# Patient Record
Sex: Male | Born: 1955 | Race: Black or African American | Hispanic: No | Marital: Married | Smoking: Never smoker
Health system: Southern US, Community
[De-identification: ages and names within clinical notes are randomized; demographics above are authoritative.]

## PROBLEM LIST (undated history)

## (undated) DIAGNOSIS — K219 Gastro-esophageal reflux disease without esophagitis: Secondary | ICD-10-CM

## (undated) DIAGNOSIS — K648 Other hemorrhoids: Secondary | ICD-10-CM

## (undated) DIAGNOSIS — R079 Chest pain, unspecified: Secondary | ICD-10-CM

## (undated) DIAGNOSIS — E119 Type 2 diabetes mellitus without complications: Secondary | ICD-10-CM

## (undated) DIAGNOSIS — S43429A Sprain of unspecified rotator cuff capsule, initial encounter: Secondary | ICD-10-CM

## (undated) DIAGNOSIS — G44309 Post-traumatic headache, unspecified, not intractable: Secondary | ICD-10-CM

## (undated) DIAGNOSIS — K5790 Diverticulosis of intestine, part unspecified, without perforation or abscess without bleeding: Secondary | ICD-10-CM

## (undated) DIAGNOSIS — K625 Hemorrhage of anus and rectum: Secondary | ICD-10-CM

## (undated) DIAGNOSIS — L500 Allergic urticaria: Secondary | ICD-10-CM

## (undated) DIAGNOSIS — T7840XA Allergy, unspecified, initial encounter: Secondary | ICD-10-CM

## (undated) DIAGNOSIS — N281 Cyst of kidney, acquired: Secondary | ICD-10-CM

## (undated) DIAGNOSIS — G4730 Sleep apnea, unspecified: Secondary | ICD-10-CM

## (undated) DIAGNOSIS — S0990XS Unspecified injury of head, sequela: Secondary | ICD-10-CM

## (undated) DIAGNOSIS — E785 Hyperlipidemia, unspecified: Secondary | ICD-10-CM

## (undated) HISTORY — DX: Gastro-esophageal reflux disease without esophagitis: K21.9

## (undated) HISTORY — DX: Type 2 diabetes mellitus without complications: E11.9

## (undated) HISTORY — DX: Post-traumatic headache, unspecified, not intractable: S09.90XS

## (undated) HISTORY — DX: Diverticulosis of intestine, part unspecified, without perforation or abscess without bleeding: K57.90

## (undated) HISTORY — DX: Allergy, unspecified, initial encounter: T78.40XA

## (undated) HISTORY — DX: Cyst of kidney, acquired: N28.1

## (undated) HISTORY — DX: Hyperlipidemia, unspecified: E78.5

## (undated) HISTORY — PX: OTHER SURGICAL HISTORY: SHX169

## (undated) HISTORY — DX: Sleep apnea, unspecified: G47.30

## (undated) HISTORY — DX: Chest pain, unspecified: R07.9

## (undated) HISTORY — DX: Unspecified injury of head, sequela: G44.309

## (undated) HISTORY — DX: Allergic urticaria: L50.0

## (undated) HISTORY — DX: Other hemorrhoids: K64.8

## (undated) HISTORY — PX: KNEE ARTHROSCOPY: SUR90

---
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
## 1999-06-19 ENCOUNTER — Ambulatory Visit (HOSPITAL_COMMUNITY): Admission: RE | Admit: 1999-06-19 | Discharge: 1999-06-19 | Payer: Self-pay | Admitting: Family Medicine

## 1999-06-19 ENCOUNTER — Encounter: Payer: Self-pay | Admitting: Family Medicine

## 1999-07-31 ENCOUNTER — Ambulatory Visit (HOSPITAL_BASED_OUTPATIENT_CLINIC_OR_DEPARTMENT_OTHER): Admission: RE | Admit: 1999-07-31 | Discharge: 1999-07-31 | Payer: Self-pay | Admitting: Orthopedic Surgery

## 1999-08-29 ENCOUNTER — Encounter: Admission: RE | Admit: 1999-08-29 | Discharge: 1999-11-27 | Payer: Self-pay | Admitting: Orthopedic Surgery

## 2001-03-10 ENCOUNTER — Encounter: Payer: Self-pay | Admitting: Internal Medicine

## 2003-12-20 ENCOUNTER — Encounter: Payer: Self-pay | Admitting: Internal Medicine

## 2005-01-05 ENCOUNTER — Ambulatory Visit: Payer: Self-pay | Admitting: Internal Medicine

## 2005-03-06 ENCOUNTER — Ambulatory Visit: Payer: Self-pay | Admitting: Internal Medicine

## 2006-01-09 ENCOUNTER — Ambulatory Visit: Payer: Self-pay | Admitting: Internal Medicine

## 2006-01-11 ENCOUNTER — Ambulatory Visit: Payer: Self-pay | Admitting: Internal Medicine

## 2006-02-26 ENCOUNTER — Ambulatory Visit: Payer: Self-pay | Admitting: Internal Medicine

## 2006-03-08 ENCOUNTER — Encounter: Admission: RE | Admit: 2006-03-08 | Discharge: 2006-03-08 | Payer: Self-pay | Admitting: General Practice

## 2006-03-09 ENCOUNTER — Emergency Department (HOSPITAL_COMMUNITY): Admission: EM | Admit: 2006-03-09 | Discharge: 2006-03-09 | Payer: Self-pay | Admitting: Emergency Medicine

## 2006-06-04 ENCOUNTER — Ambulatory Visit: Payer: Self-pay | Admitting: Internal Medicine

## 2006-06-08 ENCOUNTER — Encounter: Admission: RE | Admit: 2006-06-08 | Discharge: 2006-06-08 | Payer: Self-pay | Admitting: Orthopedic Surgery

## 2006-11-21 ENCOUNTER — Ambulatory Visit: Payer: Self-pay | Admitting: Internal Medicine

## 2006-12-03 ENCOUNTER — Ambulatory Visit: Payer: Self-pay | Admitting: Cardiology

## 2006-12-12 ENCOUNTER — Ambulatory Visit: Payer: Self-pay | Admitting: Cardiology

## 2007-01-06 ENCOUNTER — Ambulatory Visit: Payer: Self-pay | Admitting: Internal Medicine

## 2007-01-06 LAB — CONVERTED CEMR LAB
CO2: 32 meq/L (ref 19–32)
Cholesterol: 174 mg/dL (ref 0–200)
Eosinophils Relative: 6.4 % — ABNORMAL HIGH (ref 0.0–5.0)
Glucose, Bld: 104 mg/dL — ABNORMAL HIGH (ref 70–99)
HCT: 39.3 % (ref 39.0–52.0)
HDL: 41.6 mg/dL (ref >39.0)
Hemoglobin: 13.3 g/dL (ref 13.0–17.0)
Lymphocytes Relative: 32.2 % (ref 12.0–46.0)
MCHC: 33.8 g/dL (ref 30.0–36.0)
Monocytes Absolute: 0.3 10*3/uL (ref 0.2–0.7)
Monocytes Relative: 7.9 % (ref 3.0–11.0)
Neutro Abs: 2.2 10*3/uL (ref 1.4–7.7)
PSA: 0.56 ng/mL (ref 0.10–4.00)
RBC: 4.5 M/uL (ref 4.22–5.81)
RDW: 12.8 % (ref 11.5–14.6)
Triglycerides: 60 mg/dL (ref 0–149)
Uric Acid, Serum: 5.9 mg/dL (ref 2.4–7.0)
VLDL: 12 mg/dL (ref 0–40)
WBC: 4.2 10*3/uL — ABNORMAL LOW (ref 4.5–10.5)

## 2007-01-21 ENCOUNTER — Ambulatory Visit: Payer: Self-pay | Admitting: Internal Medicine

## 2007-04-23 ENCOUNTER — Ambulatory Visit: Payer: Self-pay | Admitting: Internal Medicine

## 2007-04-23 LAB — CONVERTED CEMR LAB
ALT: 24 units/L (ref 0–40)
AST: 22 units/L (ref 0–37)
Cholesterol: 222 mg/dL (ref 0–200)
Direct LDL: 152.6 mg/dL
Glucose, Bld: 88 mg/dL (ref 70–99)
HDL: 40.7 mg/dL (ref >39.0)
Total CHOL/HDL Ratio: 5.5
Triglycerides: 61 mg/dL (ref 0–149)
VLDL: 12 mg/dL (ref 0–40)

## 2007-07-04 ENCOUNTER — Ambulatory Visit: Payer: Self-pay | Admitting: Internal Medicine

## 2007-07-04 LAB — CONVERTED CEMR LAB
ALT: 28 units/L (ref 0–53)
AST: 23 units/L (ref 0–37)
Cholesterol: 204 mg/dL (ref 0–200)
Total CHOL/HDL Ratio: 5.6
Triglycerides: 76 mg/dL (ref 0–149)
VLDL: 15 mg/dL (ref 0–40)

## 2007-07-09 ENCOUNTER — Encounter: Payer: Self-pay | Admitting: Internal Medicine

## 2007-07-10 ENCOUNTER — Ambulatory Visit: Payer: Self-pay | Admitting: Internal Medicine

## 2007-07-10 DIAGNOSIS — M542 Cervicalgia: Secondary | ICD-10-CM | POA: Insufficient documentation

## 2007-07-10 DIAGNOSIS — E785 Hyperlipidemia, unspecified: Secondary | ICD-10-CM

## 2007-09-11 ENCOUNTER — Ambulatory Visit: Payer: Self-pay | Admitting: Internal Medicine

## 2007-09-11 DIAGNOSIS — D696 Thrombocytopenia, unspecified: Secondary | ICD-10-CM

## 2007-09-11 DIAGNOSIS — R413 Other amnesia: Secondary | ICD-10-CM

## 2007-09-11 DIAGNOSIS — R51 Headache: Secondary | ICD-10-CM

## 2007-09-11 DIAGNOSIS — R109 Unspecified abdominal pain: Secondary | ICD-10-CM

## 2007-09-11 DIAGNOSIS — R519 Headache, unspecified: Secondary | ICD-10-CM | POA: Insufficient documentation

## 2007-09-24 ENCOUNTER — Encounter: Admission: RE | Admit: 2007-09-24 | Discharge: 2007-09-24 | Payer: Self-pay | Admitting: Internal Medicine

## 2007-09-29 ENCOUNTER — Encounter: Payer: Self-pay | Admitting: Internal Medicine

## 2007-10-06 ENCOUNTER — Ambulatory Visit: Payer: Self-pay | Admitting: Internal Medicine

## 2007-10-06 DIAGNOSIS — R142 Eructation: Secondary | ICD-10-CM

## 2007-10-06 DIAGNOSIS — R9409 Abnormal results of other function studies of central nervous system: Secondary | ICD-10-CM

## 2007-10-06 DIAGNOSIS — R143 Flatulence: Secondary | ICD-10-CM

## 2007-10-06 DIAGNOSIS — R141 Gas pain: Secondary | ICD-10-CM | POA: Insufficient documentation

## 2007-10-06 DIAGNOSIS — J309 Allergic rhinitis, unspecified: Secondary | ICD-10-CM | POA: Insufficient documentation

## 2007-10-09 ENCOUNTER — Ambulatory Visit: Payer: Self-pay | Admitting: Internal Medicine

## 2007-10-17 LAB — CONVERTED CEMR LAB
AST: 20 units/L (ref 0–37)
Alkaline Phosphatase: 53 units/L (ref 39–117)
Cholesterol: 212 mg/dL (ref 0–200)
Direct LDL: 157.1 mg/dL
Total Bilirubin: 0.9 mg/dL (ref 0.3–1.2)

## 2007-10-28 ENCOUNTER — Encounter: Payer: Self-pay | Admitting: Internal Medicine

## 2007-11-12 ENCOUNTER — Ambulatory Visit: Payer: Self-pay | Admitting: Internal Medicine

## 2007-11-12 DIAGNOSIS — R112 Nausea with vomiting, unspecified: Secondary | ICD-10-CM

## 2007-11-12 DIAGNOSIS — T50995A Adverse effect of other drugs, medicaments and biological substances, initial encounter: Secondary | ICD-10-CM

## 2007-11-20 ENCOUNTER — Telehealth: Payer: Self-pay | Admitting: Internal Medicine

## 2007-11-20 LAB — CONVERTED CEMR LAB
ALT: 24 units/L (ref 0–53)
AST: 20 units/L (ref 0–37)
Alkaline Phosphatase: 48 units/L (ref 39–117)
Basophils Absolute: 0 10*3/uL (ref 0.0–0.1)
Bilirubin, Direct: 0.1 mg/dL (ref 0.0–0.3)
CRP, High Sensitivity: 1 (ref 0.00–5.00)
Eosinophils Relative: 3.8 % (ref 0.0–5.0)
HCT: 38.9 % — ABNORMAL LOW (ref 39.0–52.0)
MCV: 90.3 fL (ref 78.0–100.0)
Monocytes Absolute: 0.3 10*3/uL (ref 0.2–0.7)
Neutro Abs: 2 10*3/uL (ref 1.4–7.7)
Neutrophils Relative %: 52.6 % (ref 43.0–77.0)
Platelets: 72 10*3/uL — ABNORMAL LOW (ref 150–400)
RDW: 12.6 % (ref 11.5–14.6)
Total Bilirubin: 0.8 mg/dL (ref 0.3–1.2)
Total Protein: 6.6 g/dL (ref 6.0–8.3)
WBC: 3.7 10*3/uL — ABNORMAL LOW (ref 4.5–10.5)

## 2007-12-21 IMAGING — CT CT ABDOMEN W/ CM
3 of 5 series · 17 of 46 positions shown, 19 images · IV contrast (omnipaque)
Comparison: none

CLINICAL DATA: Recent history of microscopic hematuria and left flank pain. Small low-density foci in the liver and in the right kidney noted on noncontrast CT scan on 12/03/06.
ABDOMEN CT WITH CONTRAST:
TECHNIQUE: Multidetector CT imaging of the abdomen was performed following the standard protocol during bolus administration of intravenous contrast.
Contrast:  014cc Omnipaque 300 and oral Gastrografin.

[Series 2: renal_70s 5.0 b30f · axial · 0.75mm/px · z∈[-253,-28]mm · 12 of 55 slices shown, 14 images]
[im 5/55  soft-tissue]
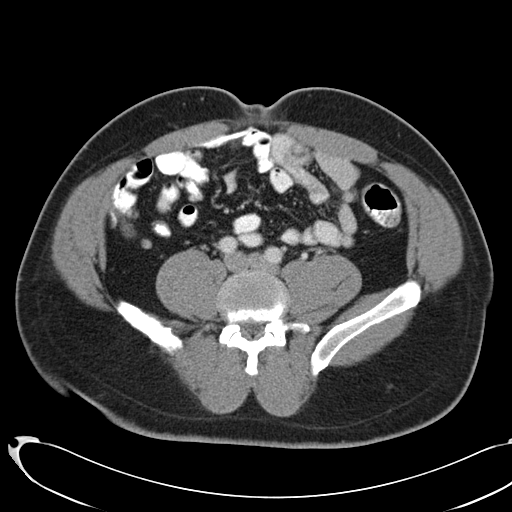
[im 5/55  bone]
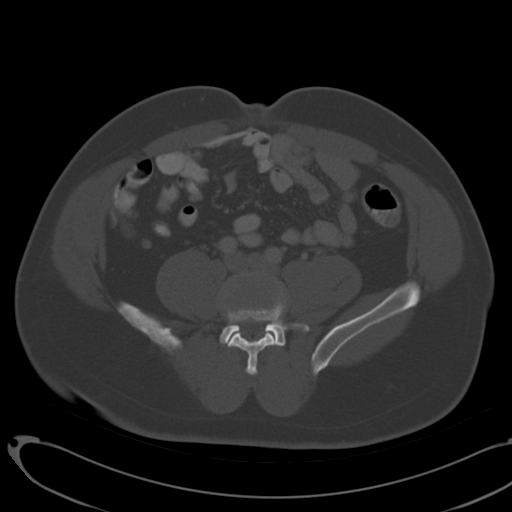
[im 9/55  soft-tissue]
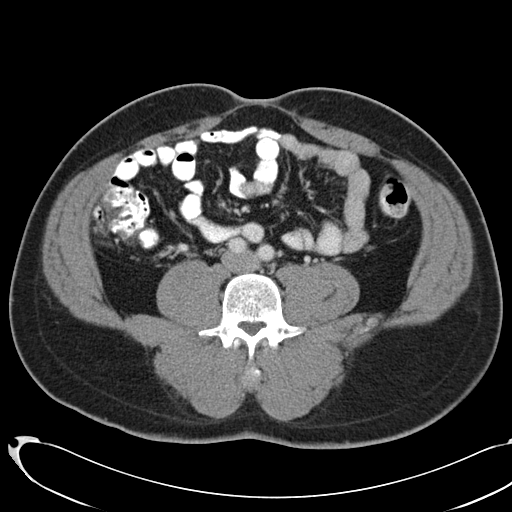
[im 13/55  soft-tissue]
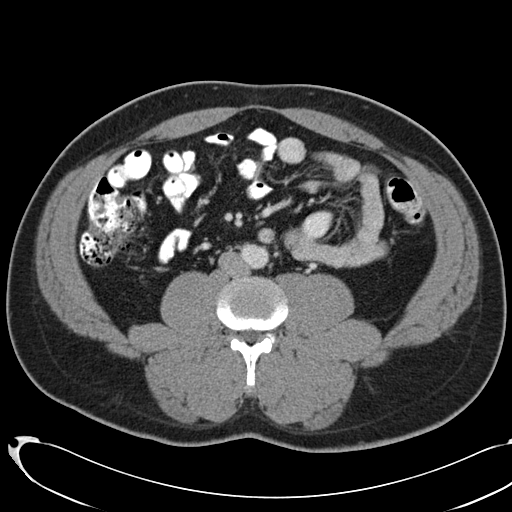
[im 17/55  soft-tissue]
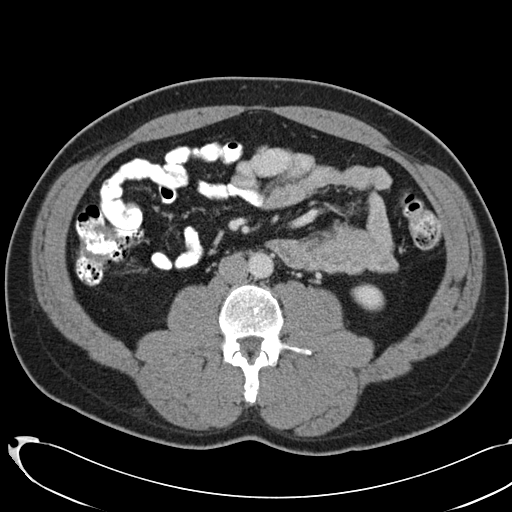
[im 21/55  soft-tissue]
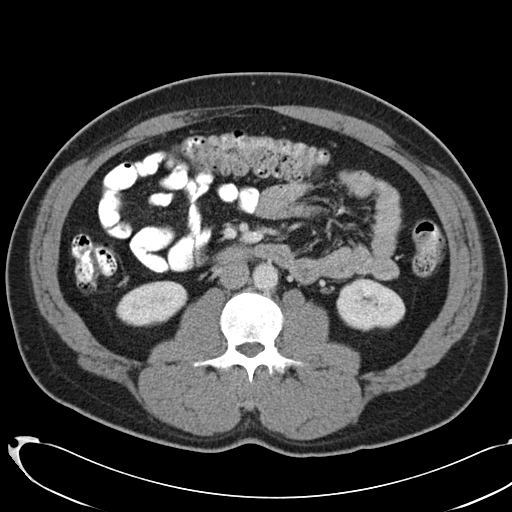
[im 25/55  soft-tissue]
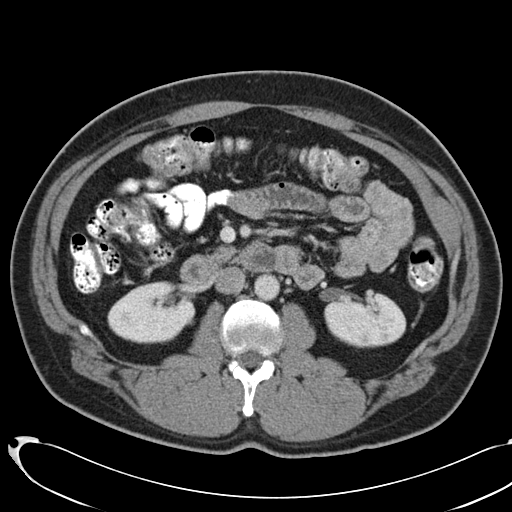
[im 30/55  soft-tissue]
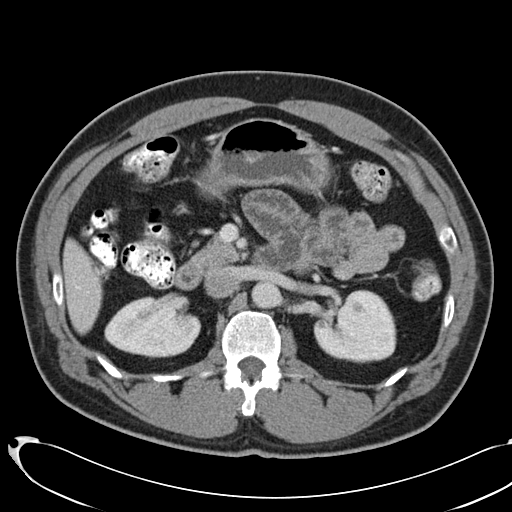
[im 34/55  soft-tissue]
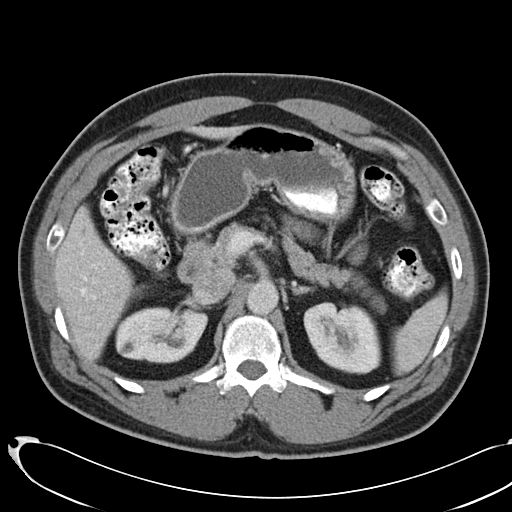
[im 38/55  soft-tissue]
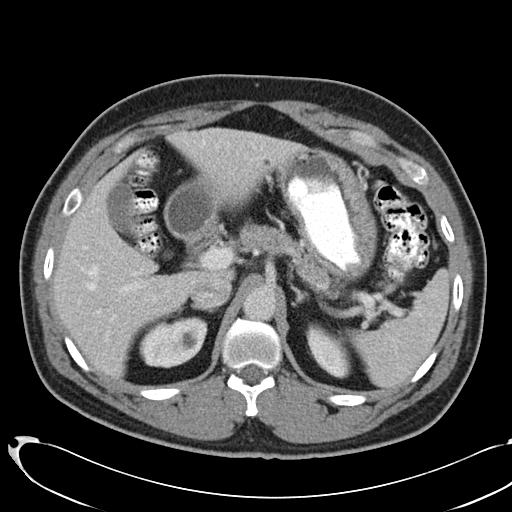
[im 38/55  bone]
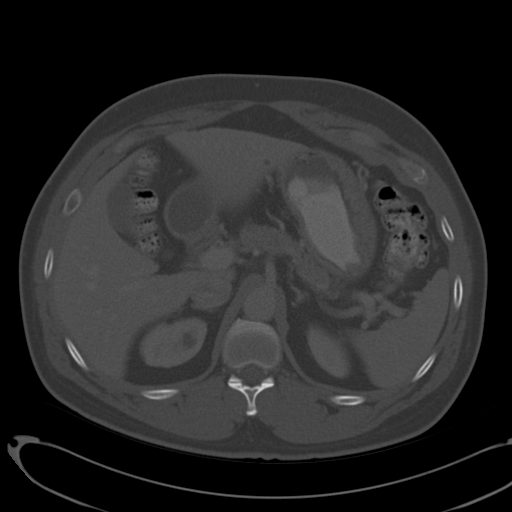
[im 42/55  soft-tissue]
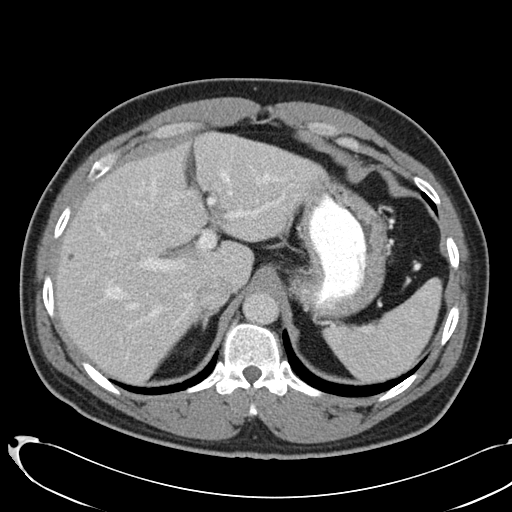
[im 46/55  soft-tissue]
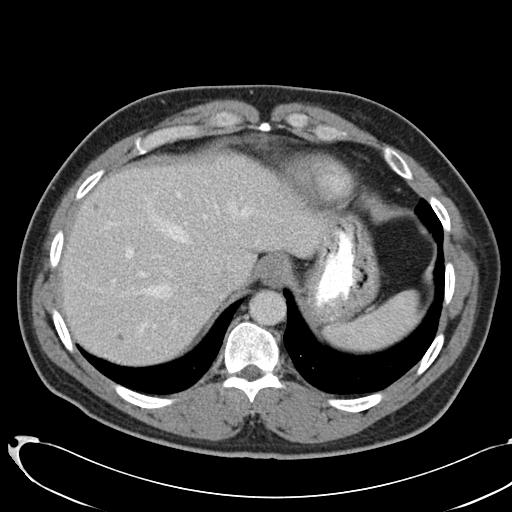
[im 50/55  soft-tissue]
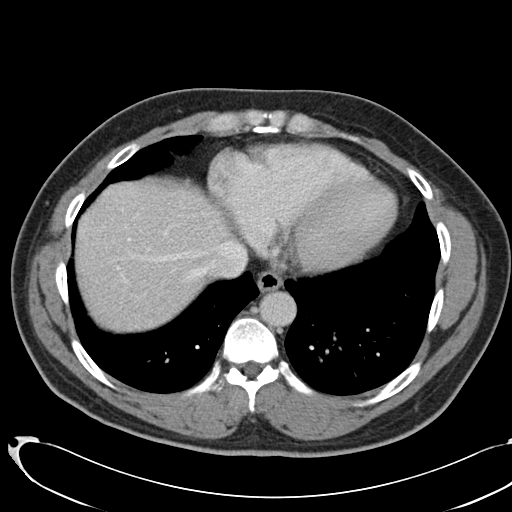

[Series 3: renal_70s 5.0 b70f lung · axial · 0.75mm/px · z∈[-88,-68]mm · 2 of 22 slices shown]
[im 5/22  bone]
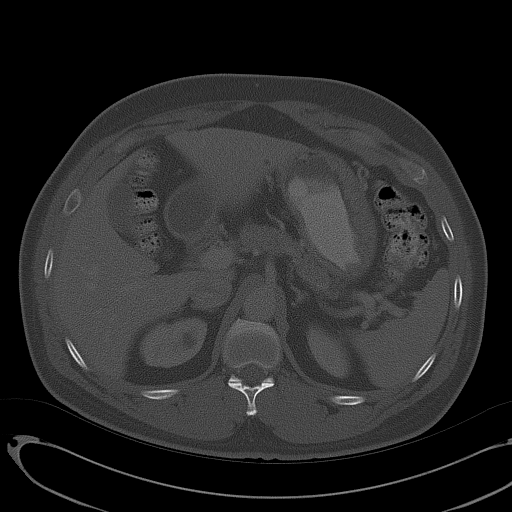
[im 9/22  bone]
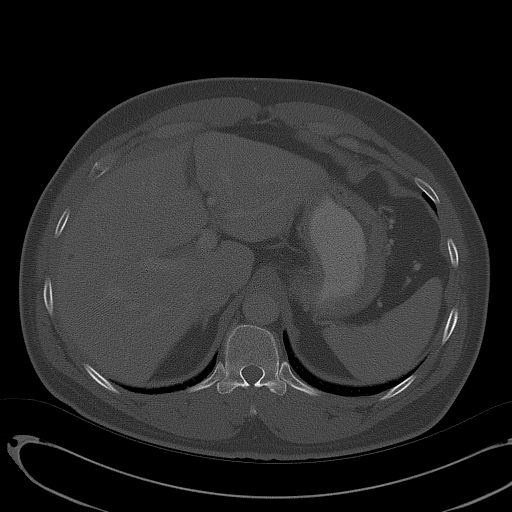

[Series 602: <mpr thick range> · coronal · 0.75mm/px · 3 of 89 slices shown]
[im 30/89  soft-tissue]
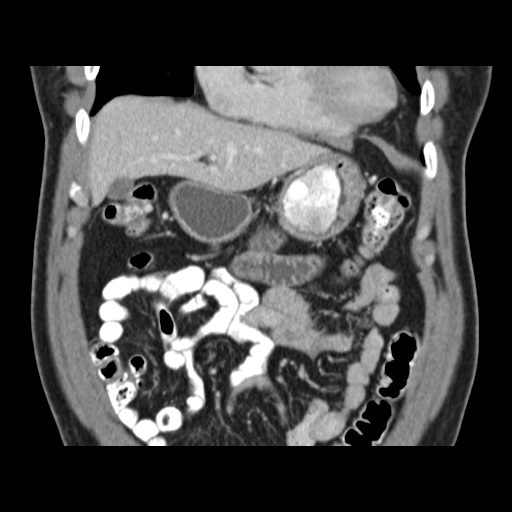
[im 40/89  soft-tissue]
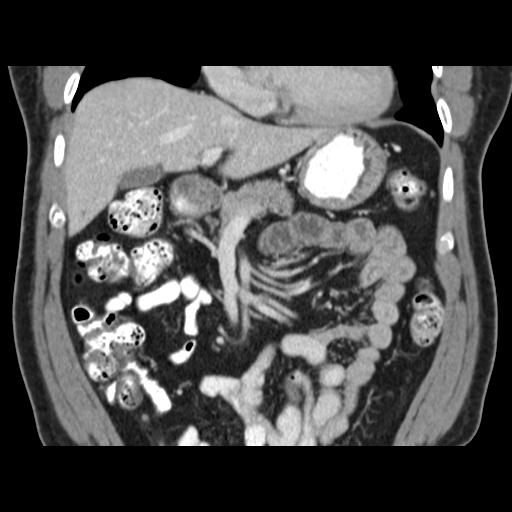
[im 49/89  soft-tissue]
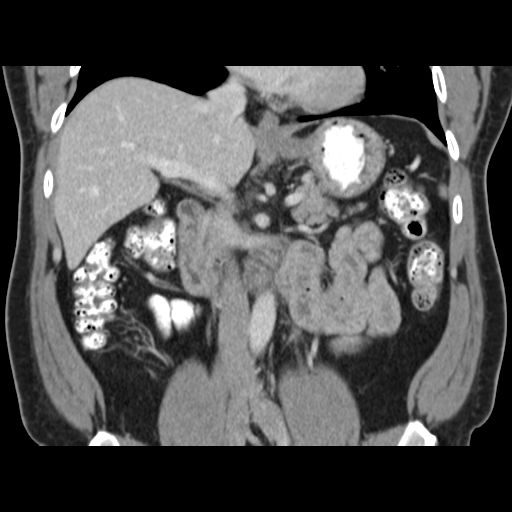

[17 of 46 positions shown; findings below may reference images not displayed]

FINDINGS: Three minute low density foci in the right hepatic lobe, one located anteriorly, one laterally, and one posteriorly measuring approximately 5 x 6 mm. These represent small hepatic cysts.  Small bilateral renal cysts, the largest in the upper pole of the right kidney measuring 1.2 cm and another slightly more inferiorly in the upper pole measuring 9 mm. There is also a cyst in the medial interpolar region and measuring 0.8 x 1.4 cm.  There is a cyst in the mid to inferior aspect of the left kidney measuring 6 mm.
IMPRESSION: Contrast enhanced study reveals bilateral (right greater than left) renal cysts and small hepatic cysts. No solid mass lesions.

## 2007-12-30 ENCOUNTER — Ambulatory Visit: Payer: Self-pay | Admitting: Family Medicine

## 2007-12-30 DIAGNOSIS — L500 Allergic urticaria: Secondary | ICD-10-CM

## 2007-12-30 HISTORY — DX: Allergic urticaria: L50.0

## 2008-01-06 ENCOUNTER — Encounter: Payer: Self-pay | Admitting: Internal Medicine

## 2008-01-22 ENCOUNTER — Telehealth: Payer: Self-pay | Admitting: Internal Medicine

## 2008-01-26 ENCOUNTER — Ambulatory Visit: Payer: Self-pay | Admitting: Internal Medicine

## 2008-01-26 DIAGNOSIS — D649 Anemia, unspecified: Secondary | ICD-10-CM

## 2008-02-16 LAB — CONVERTED CEMR LAB
ALT: 20 units/L (ref 0–53)
Basophils Absolute: 0 10*3/uL (ref 0.0–0.1)
Cholesterol: 186 mg/dL (ref 0–200)
Eosinophils Absolute: 0.5 10*3/uL (ref 0.0–0.6)
Ferritin: 300 ng/mL (ref 22.0–322.0)
Folate: 8.2 ng/mL
HCT: 38.7 % — ABNORMAL LOW (ref 39.0–52.0)
Hemoglobin: 12.9 g/dL — ABNORMAL LOW (ref 13.0–17.0)
MCV: 88.9 fL (ref 78.0–100.0)
Monocytes Absolute: 0.3 10*3/uL (ref 0.2–0.7)
Monocytes Relative: 7.8 % (ref 3.0–11.0)
Total CHOL/HDL Ratio: 5.5
Triglycerides: 46 mg/dL (ref 0–149)
WBC: 3.8 10*3/uL — ABNORMAL LOW (ref 4.5–10.5)

## 2008-06-03 ENCOUNTER — Telehealth: Payer: Self-pay | Admitting: Internal Medicine

## 2009-07-19 ENCOUNTER — Ambulatory Visit: Payer: Self-pay | Admitting: Internal Medicine

## 2009-07-19 DIAGNOSIS — K6289 Other specified diseases of anus and rectum: Secondary | ICD-10-CM

## 2009-07-19 DIAGNOSIS — M25559 Pain in unspecified hip: Secondary | ICD-10-CM

## 2009-07-19 DIAGNOSIS — M25569 Pain in unspecified knee: Secondary | ICD-10-CM | POA: Insufficient documentation

## 2009-07-27 ENCOUNTER — Encounter: Payer: Self-pay | Admitting: Internal Medicine

## 2009-08-29 ENCOUNTER — Ambulatory Visit: Payer: Self-pay | Admitting: Internal Medicine

## 2009-08-29 LAB — CONVERTED CEMR LAB
ALT: 28 units/L (ref 0–53)
Albumin: 4.1 g/dL (ref 3.5–5.2)
BUN: 13 mg/dL (ref 6–23)
Basophils Absolute: 0 10*3/uL (ref 0.0–0.1)
Basophils Relative: 0.3 % (ref 0.0–3.0)
Bilirubin, Direct: 0 mg/dL (ref 0.0–0.3)
Calcium: 8.9 mg/dL (ref 8.4–10.5)
Creatinine, Ser: 1 mg/dL (ref 0.4–1.5)
Direct LDL: 145.9 mg/dL
GFR calc non Af Amer: 100.28 mL/min (ref >60)
Glucose, Bld: 91 mg/dL (ref 70–99)
HCT: 39.4 % (ref 39.0–52.0)
HDL: 42.2 mg/dL (ref >39.00)
Lymphs Abs: 1.3 10*3/uL (ref 0.7–4.0)
MCHC: 33.9 g/dL (ref 30.0–36.0)
MCV: 91.9 fL (ref 78.0–100.0)
Monocytes Absolute: 0.3 10*3/uL (ref 0.1–1.0)
Monocytes Relative: 8.2 % (ref 3.0–12.0)
Nitrite: NEGATIVE
Potassium: 4.2 meq/L (ref 3.5–5.1)
Protein, U semiquant: NEGATIVE
Sodium: 144 meq/L (ref 135–145)
Specific Gravity, Urine: 1.02
TSH: 2.81 microintl units/mL (ref 0.35–5.50)
Total Bilirubin: 1.1 mg/dL (ref 0.3–1.2)
Total CHOL/HDL Ratio: 5
Triglycerides: 64 mg/dL (ref 0.0–149.0)
Urobilinogen, UA: 0.2
pH: 6

## 2009-09-13 ENCOUNTER — Ambulatory Visit: Payer: Self-pay | Admitting: Internal Medicine

## 2009-09-13 DIAGNOSIS — Z87448 Personal history of other diseases of urinary system: Secondary | ICD-10-CM

## 2009-09-13 DIAGNOSIS — R9431 Abnormal electrocardiogram [ECG] [EKG]: Secondary | ICD-10-CM | POA: Insufficient documentation

## 2009-09-21 ENCOUNTER — Encounter (INDEPENDENT_AMBULATORY_CARE_PROVIDER_SITE_OTHER): Payer: Self-pay | Admitting: *Deleted

## 2009-11-17 ENCOUNTER — Encounter (INDEPENDENT_AMBULATORY_CARE_PROVIDER_SITE_OTHER): Payer: Self-pay | Admitting: *Deleted

## 2009-12-12 ENCOUNTER — Encounter (INDEPENDENT_AMBULATORY_CARE_PROVIDER_SITE_OTHER): Payer: Self-pay | Admitting: *Deleted

## 2009-12-13 ENCOUNTER — Ambulatory Visit: Payer: Self-pay | Admitting: Internal Medicine

## 2009-12-14 ENCOUNTER — Encounter: Payer: Self-pay | Admitting: Internal Medicine

## 2009-12-22 ENCOUNTER — Telehealth: Payer: Self-pay | Admitting: Internal Medicine

## 2009-12-27 ENCOUNTER — Ambulatory Visit: Payer: Self-pay | Admitting: Internal Medicine

## 2009-12-27 HISTORY — PX: COLONOSCOPY: SHX174

## 2009-12-28 ENCOUNTER — Encounter: Payer: Self-pay | Admitting: Internal Medicine

## 2009-12-30 ENCOUNTER — Encounter: Admission: RE | Admit: 2009-12-30 | Discharge: 2009-12-30 | Payer: Self-pay | Admitting: Specialist

## 2010-01-04 ENCOUNTER — Ambulatory Visit: Payer: Self-pay | Admitting: Cardiology

## 2010-01-04 ENCOUNTER — Encounter: Payer: Self-pay | Admitting: Cardiology

## 2010-01-04 DIAGNOSIS — R079 Chest pain, unspecified: Secondary | ICD-10-CM | POA: Insufficient documentation

## 2010-01-04 HISTORY — DX: Chest pain, unspecified: R07.9

## 2010-01-24 ENCOUNTER — Encounter: Payer: Self-pay | Admitting: Cardiology

## 2010-03-07 ENCOUNTER — Telehealth: Payer: Self-pay | Admitting: *Deleted

## 2010-03-14 DIAGNOSIS — Z860100 Personal history of colon polyps, unspecified: Secondary | ICD-10-CM | POA: Insufficient documentation

## 2010-03-14 DIAGNOSIS — K5730 Diverticulosis of large intestine without perforation or abscess without bleeding: Secondary | ICD-10-CM | POA: Insufficient documentation

## 2010-03-14 DIAGNOSIS — K649 Unspecified hemorrhoids: Secondary | ICD-10-CM | POA: Insufficient documentation

## 2010-03-14 DIAGNOSIS — D72819 Decreased white blood cell count, unspecified: Secondary | ICD-10-CM | POA: Insufficient documentation

## 2010-03-22 ENCOUNTER — Encounter (INDEPENDENT_AMBULATORY_CARE_PROVIDER_SITE_OTHER): Payer: Self-pay | Admitting: *Deleted

## 2010-05-22 ENCOUNTER — Ambulatory Visit: Payer: Self-pay | Admitting: Internal Medicine

## 2010-05-22 ENCOUNTER — Encounter: Payer: Self-pay | Admitting: Internal Medicine

## 2010-05-23 ENCOUNTER — Ambulatory Visit: Payer: Self-pay | Admitting: Internal Medicine

## 2010-05-27 ENCOUNTER — Encounter: Payer: Self-pay | Admitting: Internal Medicine

## 2010-09-14 ENCOUNTER — Ambulatory Visit: Payer: Self-pay | Admitting: Internal Medicine

## 2010-09-14 DIAGNOSIS — K625 Hemorrhage of anus and rectum: Secondary | ICD-10-CM

## 2010-09-14 HISTORY — DX: Hemorrhage of anus and rectum: K62.5

## 2010-09-14 LAB — CONVERTED CEMR LAB
Bilirubin Urine: NEGATIVE
Glucose, Urine, Semiquant: NEGATIVE
Ketones, urine, test strip: NEGATIVE
Nitrite: NEGATIVE
Protein, U semiquant: NEGATIVE
Urobilinogen, UA: 0.2
WBC Urine, dipstick: NEGATIVE
pH: 6

## 2010-09-20 ENCOUNTER — Telehealth: Payer: Self-pay | Admitting: *Deleted

## 2010-09-20 LAB — CONVERTED CEMR LAB
Alkaline Phosphatase: 50 units/L (ref 39–117)
Basophils Absolute: 0 10*3/uL (ref 0.0–0.1)
Bilirubin, Direct: 0.1 mg/dL (ref 0.0–0.3)
Calcium: 9.4 mg/dL (ref 8.4–10.5)
Creatinine, Ser: 0.9 mg/dL (ref 0.4–1.5)
Folate: 8.2 ng/mL
Glucose, Bld: 83 mg/dL (ref 70–99)
Hemoglobin: 12.9 g/dL — ABNORMAL LOW (ref 13.0–17.0)
Iron: 68 ug/dL (ref 42–165)
Lymphocytes Relative: 32.9 % (ref 12.0–46.0)
Monocytes Absolute: 0.3 10*3/uL (ref 0.1–1.0)
Monocytes Relative: 7.9 % (ref 3.0–12.0)
Neutro Abs: 2 10*3/uL (ref 1.4–7.7)
Neutrophils Relative %: 54.9 % (ref 43.0–77.0)
Platelets: 102 10*3/uL — ABNORMAL LOW (ref 150.0–400.0)
Potassium: 3.9 meq/L (ref 3.5–5.1)
RBC: 4.27 M/uL (ref 4.22–5.81)
RDW: 13.7 % (ref 11.5–14.6)
Sodium: 141 meq/L (ref 135–145)

## 2010-09-21 ENCOUNTER — Encounter: Admission: RE | Admit: 2010-09-21 | Discharge: 2010-09-21 | Payer: Self-pay | Admitting: Internal Medicine

## 2010-09-21 ENCOUNTER — Telehealth: Payer: Self-pay | Admitting: *Deleted

## 2010-09-25 ENCOUNTER — Ambulatory Visit: Payer: Self-pay | Admitting: Internal Medicine

## 2010-09-26 ENCOUNTER — Telehealth: Payer: Self-pay | Admitting: *Deleted

## 2010-09-27 ENCOUNTER — Ambulatory Visit: Payer: Self-pay | Admitting: Internal Medicine

## 2010-10-02 ENCOUNTER — Encounter: Payer: Self-pay | Admitting: Internal Medicine

## 2010-10-02 LAB — CBC WITH DIFFERENTIAL/PLATELET
Basophils Absolute: 0 10*3/uL (ref 0.0–0.1)
EOS%: 3.7 % (ref 0.0–7.0)
Eosinophils Absolute: 0.2 10*3/uL (ref 0.0–0.5)
HCT: 36.9 % — ABNORMAL LOW (ref 38.4–49.9)
HGB: 12.7 g/dL — ABNORMAL LOW (ref 13.0–17.1)
MCH: 28.8 pg (ref 27.2–33.4)
MONO#: 0.3 10*3/uL (ref 0.1–0.9)
NEUT#: 2.1 10*3/uL (ref 1.5–6.5)
NEUT%: 51.2 % (ref 39.0–75.0)
lymph#: 1.6 10*3/uL (ref 0.9–3.3)

## 2010-10-02 LAB — COMPREHENSIVE METABOLIC PANEL
ALT: 11 U/L (ref 0–53)
Albumin: 4.3 g/dL (ref 3.5–5.2)
CO2: 28 mEq/L (ref 19–32)
Chloride: 106 mEq/L (ref 96–112)
Glucose, Bld: 80 mg/dL (ref 70–99)
Potassium: 3.9 mEq/L (ref 3.5–5.3)
Sodium: 142 mEq/L (ref 135–145)
Total Protein: 6.6 g/dL (ref 6.0–8.3)

## 2010-10-02 LAB — TECHNOLOGIST REVIEW

## 2010-10-02 LAB — LACTATE DEHYDROGENASE: LDH: 130 U/L (ref 94–250)

## 2010-11-01 ENCOUNTER — Telehealth: Payer: Self-pay | Admitting: Internal Medicine

## 2010-11-02 ENCOUNTER — Ambulatory Visit: Admit: 2010-11-02 | Payer: Self-pay | Admitting: Internal Medicine

## 2010-11-02 ENCOUNTER — Telehealth: Payer: Self-pay | Admitting: Internal Medicine

## 2010-11-13 ENCOUNTER — Encounter: Payer: Self-pay | Admitting: Internal Medicine

## 2010-11-15 ENCOUNTER — Telehealth (INDEPENDENT_AMBULATORY_CARE_PROVIDER_SITE_OTHER): Payer: Self-pay | Admitting: *Deleted

## 2010-11-15 ENCOUNTER — Ambulatory Visit
Admission: RE | Admit: 2010-11-15 | Discharge: 2010-11-15 | Payer: Self-pay | Source: Home / Self Care | Attending: Internal Medicine | Admitting: Internal Medicine

## 2010-11-27 ENCOUNTER — Ambulatory Visit: Payer: Self-pay | Admitting: Internal Medicine

## 2010-11-29 LAB — LACTATE DEHYDROGENASE: LDH: 147 U/L (ref 94–250)

## 2010-11-29 LAB — CBC & DIFF AND RETIC
HCT: 39.2 % (ref 38.4–49.9)
HGB: 13.2 g/dL (ref 13.0–17.1)
MCH: 28.7 pg (ref 27.2–33.4)
MCHC: 33.7 g/dL (ref 32.0–36.0)
NEUT#: 2.4 10*3/uL (ref 1.5–6.5)
RBC: 4.6 10*6/uL (ref 4.20–5.82)
RDW: 13.4 % (ref 11.0–14.6)
Retic %: 1.4 % (ref 0.50–1.60)
WBC: 4.3 10*3/uL (ref 4.0–10.3)

## 2010-11-29 LAB — COMPREHENSIVE METABOLIC PANEL
AST: 26 U/L (ref 0–37)
Albumin: 4.2 g/dL (ref 3.5–5.2)
CO2: 26 mEq/L (ref 19–32)
Calcium: 9.8 mg/dL (ref 8.4–10.5)
Chloride: 106 mEq/L (ref 96–112)
Glucose, Bld: 116 mg/dL — ABNORMAL HIGH (ref 70–99)
Potassium: 4.2 mEq/L (ref 3.5–5.3)
Sodium: 143 mEq/L (ref 135–145)
Total Bilirubin: 1.1 mg/dL (ref 0.3–1.2)

## 2010-12-01 LAB — HEMOGLOBINOPATHY EVALUATION
Hemoglobin Other: 0 % (ref 0.0–0.0)
Hgb S Quant: 0 % (ref 0.0–0.0)

## 2010-12-01 LAB — PROTEIN ELECTROPHORESIS, SERUM
Albumin ELP: 63.2 % (ref 55.8–66.1)
Alpha-1-Globulin: 3.8 % (ref 2.9–4.9)
Alpha-2-Globulin: 8.6 % (ref 7.1–11.8)
Beta 2: 5.8 % (ref 3.2–6.5)
Beta Globulin: 5.7 % (ref 4.7–7.2)

## 2010-12-01 LAB — IRON AND TIBC: UIBC: 271 ug/dL

## 2010-12-01 LAB — FOLATE: Folate: 12.2 ng/mL

## 2010-12-01 LAB — ERYTHROPOIETIN: Erythropoietin: 14.7 m[IU]/mL (ref 2.6–34.0)

## 2010-12-01 LAB — FERRITIN: Ferritin: 451 ng/mL — ABNORMAL HIGH (ref 22–322)
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
## 2010-12-05 NOTE — Miscellaneous (Signed)
  Clinical Lists Changes  Observations: Added new observation of CT OF HEAD:   CT HEAD WITHOUT CONTRAST   IMPRESSION:   Normal noncontrast CT appearance of the brain for age.    Read By:  Augusto Gamble,  M.D.   Released By:  Augusto Gamble,  M.D. (12/30/2009 12:23) Added new observation of CXR RESULTS:  CHEST - 2 VIEW:   Comparison: 03/08/06.   Findings: Cardiac silhouette, mediastinal and hilar contours are   within normal limits and relatively stable. The heart is borderline   in size but unchanged.  No acute pulmonary findings. No definite rib   fractures.  No sternal fracture is seen on the lateral film.   IMPRESSION:   Borderline cardiac enlargement, stable.   No acute pulmonary findings and intact bony thorax.    Read By:  Cyndie Chime,  M.D.   Released By:  Cyndie Chime,  M.D. (03/09/2006 12:24)      CT Brain  Procedure date:  12/30/2009  Findings:        CT HEAD WITHOUT CONTRAST   IMPRESSION:   Normal noncontrast CT appearance of the brain for age.    Read By:  Augusto Gamble,  M.D.   Released By:  Augusto Gamble,  M.D.  CXR  Procedure date:  03/09/2006  Findings:       CHEST - 2 VIEW:   Comparison: 03/08/06.   Findings: Cardiac silhouette, mediastinal and hilar contours are   within normal limits and relatively stable. The heart is borderline   in size but unchanged.  No acute pulmonary findings. No definite rib   fractures.  No sternal fracture is seen on the lateral film.   IMPRESSION:   Borderline cardiac enlargement, stable.   No acute pulmonary findings and intact bony thorax.    Read By:  Cyndie Chime,  M.D.   Released By:  Cyndie Chime,  M.D.

## 2010-12-05 NOTE — Procedures (Signed)
Summary: Upper Endoscopy  Patient: [name] Note: All result statuses are Final unless otherwise noted.  Tests: (1) Upper Endoscopy (EGD)   EGD Upper Endoscopy       DONE     [facility]     [address]     [place], [place]  [address]           ENDOSCOPY PROCEDURE REPORT           PATIENT:  Brad Terry, Brad Terry  MR#:  [id number]     BIRTHDATE:  05-19-1956, 54 yrs. old  GENDER:  male           ENDOSCOPIST:  Hedwig Morton. Juanda Chance, MD     Referred by:  Neta Mends. Panosh, M.D.           PROCEDURE DATE:  05/23/2010     PROCEDURE:  EGD with biopsy     ASA CLASS:  Class II     INDICATIONS:  chest pain right sided chest pain,, headache, worse     with eating,     EGD 1989 was normal ( hx of IBS/dyspepsia)           MEDICATIONS:   Versed 6 mg, Fentanyl 75 mcg     TOPICAL ANESTHETIC:  Exactacain Spray           DESCRIPTION OF PROCEDURE:   After the risks benefits and     alternatives of the procedure were thoroughly explained, informed     consent was obtained.  The LB [id number] endoscope was     introduced through the mouth and advanced to the second portion of     the duodenum, without limitations.  The instrument was slowly     withdrawn as the mucosa was fully examined.     <<PROCEDUREIMAGES>>           irregular Z-line. With standard forceps, a biopsy was obtained and     sent to pathology (see image1, image6, and image5).  Otherwise the     examination was normal. With standard forceps, a biopsy was     obtained and sent to pathology (see image4, image3, and image2).     Retroflexed views revealed no abnormalities.    The scope was then     withdrawn from the patient and the procedure completed.           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           ______________________________     Hedwig Morton. Juanda Chance, MD           CC:           n.     eSIGNED:   Hedwig Morton. Brodie at 05/23/2010 04:01 PM           Brad Terry, Brad Terry, [id number]  Note: An exclamation mark (!) indicates a result that was not dispersed into the flowsheet. Document Creation Date: 05/23/2010 4:03 PM _______________________________________________________________________  (1) Order result status: Final Collection or observation date-time: 05/23/2010 15:51 Requested date-time:  Receipt date-time:  Reported date-time:  Referring Physician:   Ordering Physician: Lina Sar [id number]) Specimen Source:  Source: Launa Grill Order Number: [id number] Lab site:

## 2010-12-05 NOTE — Letter (Signed)
Summary: EGD Instructions  [facility]  [address] [place], [place] [address]   Phone: [phone]  Fax: [phone]       Brad Terry    09/18/1956    MRN: [id number]       Procedure Day /Date: 05/23/10 Tuesday     Arrival Time:  2:00 pm     Procedure Time: 3:00 pm     Location of Procedure:                    _x  _ [facility] (4th Floor)  PREPARATION FOR ENDOSCOPY   On 05/13/10 THE DAY OF THE PROCEDURE:  1.   No solid foods, milk or milk products are allowed after midnight the night before your procedure.  2.   Do not drink anything colored red or purple.  Avoid juices with pulp.  No orange juice.  3.  You may drink clear liquids until 1:00 pm, which is 2 hours before your procedure.                                                                                                CLEAR LIQUIDS INCLUDE: Water Jello Ice Popsicles Tea (sugar ok, no milk/cream) Powdered fruit flavored drinks Coffee (sugar ok, no milk/cream) Gatorade Juice: apple, white grape, white cranberry  Lemonade Clear bullion, consomm, broth Carbonated beverages (any kind) Strained chicken noodle soup Hard Candy   MEDICATION INSTRUCTIONS  Unless otherwise instructed, you should take regular prescription medications with a small sip of water as early as possible the morning of your procedure.                   OTHER INSTRUCTIONS  You will need a responsible adult at least 55 years of age to accompany you and drive you home.   This person must remain in the waiting room during your procedure.  Wear loose fitting clothing that is easily removed.  Leave jewelry and other valuables at home.  However, you may wish to bring a book to read or an iPod/MP3 player to listen to music as you wait for your procedure to start.  Remove all body piercing jewelry and leave at home.  Total time from sign-in until discharge is approximately 2-3 hours.  You  should go home directly after your procedure and rest.  You can resume normal activities the day after your procedure.  The day of your procedure you should not:   Drive   Make legal decisions   Operate machinery   Drink alcohol   Return to work  You will receive specific instructions about eating, activities and medications before you leave.    The above instructions have been reviewed and explained to me by   Lamona Curl CMA Duncan Dull)  May 22, 2010 4:03 PM     I fully understand and can verbalize these instructions _____________________________ Date 05/22/10

## 2010-12-05 NOTE — Assessment & Plan Note (Signed)
Summary: Gastroenterology     [facility]   OFFICE NOTE  NAME:  Brad Terry, Brad Terry        OFFICE NO:  [id number]    DATE:  03-10-01     The patient is a very nice 55 year old gentleman from [place] who has a change in bowel habits, decreased caliber of the stools, no blood, and some mild constipation.  He also had one episode of nausea last week.  He tried to vomit, but was unable.  He developed some dizziness after trying to gag and retch.  He has no family history of colon cancer.    Approximately 2 months ago, the patient developed rectal pain and was evaluated by a physician.  Urinalysis was negative and apparently KUB was also negative.  The pain has subsided.    PHYSICAL EXAMINATION:  Blood pressure 110/80.  Pulse 72.  Weight 215 pounds.  Abdomen was soft with tenderness along the left lower quadrant, but no palpable mass. The liver edge was at the costal margin.  Chest was normal.  Cor with normal S1 and normal S2.  Rectal and anoscopic exam showed first grade hemorrhoids with somewhat tight rectal sphincter tone with some guarding.  Stool was soft and Hemoccult negative.  There was no perianal disease.  IMPRESSION:   1.   Change in bowel habits most likely on the basis of irritable bowel syndrome.  No risk factors for colon cancer. 2.   Hemorrhoids by anoscopy. 3.   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  This could be causing exacerbation of the irritable bowel syndrome.   4.   History of decreased platelet count and wbc count needs to be followed.    PLAN:   1.   C-MET, CBC and PSA today. 2.   Anusol-HC suppositories for hemorrhoids. 3.   Diazepam 5 mg on a p.r.n. basis. 4.   The patient needs a general internist to take care of his preventative care. 5.   Consider colonoscopy.  Since patient has no insurance, we will hold off on colonoscopy and see how his symptoms improve.   Hart Carwin, M.D.  [id number]  D: 03/10/01  T: 03/10/01  Job # [id number]

## 2010-12-05 NOTE — Miscellaneous (Signed)
Summary: SCREENING COLON--CH.  Clinical Lists Changes  Medications: Added new medication of MIRALAX   POWD (POLYETHYLENE GLYCOL 3350) As directed - Signed Added new medication of REGLAN 10 MG  TABS (METOCLOPRAMIDE HCL) As directed - Signed Added new medication of DULCOLAX 5 MG  TBEC (BISACODYL) As directed - Signed Rx of MIRALAX   POWD (POLYETHYLENE GLYCOL 3350) As directed;  #255 x 1;  Signed;  Entered by: Clide Cliff RN;  Authorized by: Hart Carwin MD;  Method used: [facility] [address] [phone]*, [address], [place], [place], [place]  [address], Ph: [phone] or [phone], Fax: [phone] Rx of REGLAN 10 MG  TABS (METOCLOPRAMIDE HCL) As directed;  #2 x 0;  Signed;  Entered by: Clide Cliff RN;  Authorized by: Hart Carwin MD;  Method used: Electronically to [facility] [address] [phone]*, [address] [place], [place], [place]  [address], Ph: [phone] or [phone], Fax: [phone] Rx of DULCOLAX 5 MG  TBEC (BISACODYL) As directed;  #4 x 0;  Signed;  Entered by: Clide Cliff RN;  Authorized by: Hart Carwin MD;  Method used: Electronically to [facility] [address] [phone]*, [address] [place], [place], [place]  [address], Ph: [phone] or [phone], Fax: [phone] Observations: Added new observation of ALLERGY REV: Done (12/13/2009 13:34)    Prescriptions: DULCOLAX 5 MG  TBEC (BISACODYL) As directed  #4 x 0   Entered by:   Clide Cliff RN   Authorized by:   Hart Carwin MD   Signed by:   Clide Cliff RN on 12/13/2009   Method used:   Electronically to        [facility] [phone]* (retail)       [address]       [place], [place]  [address]       Ph: [phone] or [phone]       Fax: [phone]   RxID:   [id number] REGLAN 10 MG  TABS (METOCLOPRAMIDE HCL) As directed  #2 x 0   Entered by:   Clide Cliff RN   Authorized by:   Hart Carwin  MD   Signed by:   Clide Cliff RN on 12/13/2009   Method used:   Electronically to        [facility] [phone]* (retail)       [address]       [place], [place]  [address]       Ph: [phone] or [phone]       Fax: [phone]   RxID:   [id number] MIRALAX   POWD (POLYETHYLENE GLYCOL 3350) As directed  #255 x 1   Entered by:   Clide Cliff RN   Authorized by:   Hart Carwin MD   Signed by:   Clide Cliff RN on 12/13/2009   Method used:   Electronically to        [facility] [phone]* (retail)       [address]       [place], [place]  [address]       Ph: [phone] or [phone]       Fax: [phone]   RxID:   [id number]

## 2010-12-05 NOTE — Letter (Signed)
Summary: New Patient letter  [facility]  [address] [place], [place] [address]   Phone: [phone]  Fax: [phone]       03/22/2010 MRN: [id number]  Brad Terry [address] [place], [place]  [address]  Dear Brad Terry,  Welcome to the Gastroenterology Division at [facility].    You are scheduled to see Dr.  Juanda Chance on 05-22-10 at 2:45pm on the 3rd floor at [facility], [address].  We ask that you try to arrive at our office 15 minutes prior to your appointment time to allow for check-in.  We would like you to complete the enclosed self-administered evaluation form prior to your visit and bring it with you on the day of your appointment.  We will review it with you.  Also, please bring a complete list of all your medications or, if you prefer, bring the medication bottles and we will list them.  Please bring your insurance card so that we may make a copy of it.  If your insurance requires a referral to see a specialist, please bring your referral form from your primary care physician.  Co-payments are due at the time of your visit and may be paid by cash, check or credit card.     Your office visit will consist of a consult with your physician (includes a physical exam), any laboratory testing he/she may order, scheduling of any necessary diagnostic testing (e.g. x-ray, ultrasound, CT-scan), and scheduling of a procedure (e.g. Endoscopy, Colonoscopy) if required.  Please allow enough time on your schedule to allow for any/all of these possibilities.    If you cannot keep your appointment, please call [phone] to cancel or reschedule prior to your appointment date.  This allows [place] the opportunity to schedule an appointment for another patient in need of care.  If you do not cancel or reschedule by 5 p.m. the business day prior to your appointment date, you will be charged a $50.00 late cancellation/no-show fee.    Thank you for  choosing [facility] for your medical needs.  We appreciate the opportunity to care for you.  Please visit [place] at our website  to learn more about our practice.                     Sincerely,                                                             The Gastroenterology Division

## 2010-12-05 NOTE — Progress Notes (Signed)
Summary: Rx Clarification  Phone Note From Pharmacy   Caller: Monica Call For: [phone]   Summary of Call: Needs to know if ok to dispense anusol as generic  Initial call taken by: Trixie Dredge,  September 20, 2010 11:39 AM  Follow-up for Phone Call        Called pharmacy and told them that it is okay to do generic Follow-up by: Romualdo Bolk, CMA Duncan Dull),  September 20, 2010 12:14 PM

## 2010-12-05 NOTE — Progress Notes (Signed)
Summary: Pt req to get order to have MRI.   Phone Note Call from Patient Call back at  cell   [phone]   Caller: spouse- [name] Summary of Call: Pts wife called and says pt needs to get an order have an MRI done. Pts wife needs to discuss some this regarding this matter. [phone] Initial call taken by: Lucy Antigua,  December 22, 2009 11:01 AM  Follow-up for Phone Call        Spoke to pt's wife and he is having rt side pain all. Pt has seen neurology and they are treating him for miagraines. Pt is [facility]- Dr. Neale Burly. Pt is wanting a MRI of everything. Follow-up by: Romualdo Bolk, CMA Duncan Dull),  December 22, 2009 11:21 AM  Additional Follow-up for Phone Call Additional follow up Details #1::        Call Dr. Onnie Boer office and ask if they have address pain on rt side of body because pt wants a mri of his body. Additional Follow-up by: Romualdo Bolk, CMA Duncan Dull),  December 22, 2009 12:40 PM    Additional Follow-up for Phone Call Additional follow up Details #2::    pt returning a call. Follow-up by: Warnell Forester,  December 23, 2009 9:04 AM  Additional Follow-up for Phone Call Additional follow up Details #3:: Details for Additional Follow-up Action Taken: I tried to call Dr. Onnie Boer office but they close at 11:45am on friday. I left a message on pt's wife cell that we are going to call Dr. Onnie Boer office about this and we will call them back once we hear from them. Romualdo Bolk, CMA (AAMA)  December 23, 2009 12:56 PM Pt's wife aware of this. Romualdo Bolk, CMA Duncan Dull)  December 23, 2009 3:26 PM Left a message for Dr. Neale Burly on his voicemail about what is going on with pt and to call us back about this. Romualdo Bolk, CMA Duncan Dull)  December 29, 2009 9:43 AM Dr. Onnie Boer office called they are going to order a CT and electro diagnostic test on this.  They will notify pt about this. Romualdo Bolk, CMA (AAMA)  December 29, 2009 11:44 AM

## 2010-12-05 NOTE — Letter (Signed)
Summary: Previsit letter  [facility]  [address] [place], [place] [address]   Phone: [phone]  Fax: [phone]       11/17/2009 MRN: [id number]  [place] [name] [address] [place], [place]  [address]  Dear Brad Terry,  Welcome to the Gastroenterology Division at [facility].    You are scheduled to see a nurse for your pre-procedure visit on 12-13-09 at 2:00p.m. on the 3rd floor at [facility], [address].  We ask that you try to arrive at our office 15 minutes prior to your appointment time to allow for check-in.  Your nurse visit will consist of discussing your medical and surgical history, your immediate family medical history, and your medications.    Please bring a complete list of all your medications or, if you prefer, bring the medication bottles and we will list them.  We will need to be aware of both prescribed and over the counter drugs.  We will need to know exact dosage information as well.  If you are on blood thinners (Coumadin, Plavix, Aggrenox, Ticlid, etc.) please call our office today/prior to your appointment, as we need to consult with your physician about holding your medication.   Please be prepared to read and sign documents such as consent forms, a financial agreement, and acknowledgement forms.  If necessary, and with your consent, a friend or relative is welcome to sit-in on the nurse visit with you.  Please bring your insurance card so that we may make a copy of it.  If your insurance requires a referral to see a specialist, please bring your referral form from your primary care physician.  No co-pay is required for this nurse visit.     If you cannot keep your appointment, please call [phone] to cancel or reschedule prior to your appointment date.  This allows [place] the opportunity to schedule an appointment for another patient in need of care.    Thank you for choosing [facility] for your  medical needs.  We appreciate the opportunity to care for you.  Please visit [place] at our website  to learn more about our practice.                     Sincerely.                                                                                                                   The Gastroenterology Division

## 2010-12-05 NOTE — Letter (Signed)
Summary: Patient Notice- Polyp Results  [facility]  [address] [place], [place] [address]   Phone: [phone]  Fax: [phone]        December 28, 2009 MRN: [id number]    Brad Terry [address] [place], [place]  [address]    Dear Mr. CULP,  I am pleased to inform you that the colon polyp(s) removed during your recent colonoscopy was (were) found to be benign (no cancer detected) upon pathologic examination.The polyp was hyperplastic, ( not precancerous)  I recommend you have a repeat colonoscopy examination in 10 _ years to look for recurrent polyps, as having colon polyps increases your risk for having recurrent polyps or even colon cancer in the future.  Should you develop new or worsening symptoms of abdominal pain, bowel habit changes or bleeding from the rectum or bowels, please schedule an evaluation with either your primary care physician or with me.  Additional information/recommendations:  _x_ No further action with gastroenterology is needed at this time. Please      follow-up with your primary care physician for your other healthcare      needs.  __ Please call [phone] to schedule a return visit to review your      situation.  __ Please keep your follow-up visit as already scheduled.  __ Continue treatment plan as outlined the day of your exam.  Please call us if you are having persistent problems or have questions about your condition that have not been fully answered at this time.  Sincerely,  Hart Carwin MD  This letter has been electronically signed by your physician.  Appended Document: Patient Notice- Polyp Results  Letter mailed 2.24.11

## 2010-12-05 NOTE — Letter (Signed)
Summary: [facility]  [facility]   Imported By: Maryln Gottron 01/03/2010 11:23:11  _____________________________________________________________________  External Attachment:    Type:   Image     Comment:   External Document

## 2010-12-05 NOTE — Progress Notes (Signed)
Summary: Pt wants a MRI  Phone Note Call from Patient Call back at [phone]   Caller: Wife-[name] Summary of Call: Pt's wife called and they still want a MRI for the rt side pain. Pain is from head going down to his foot. I asked his wife if they have discuss this with the Neurologist and she said they haven't yet. I told her to talk to them first then if they think he needs a MRI they can do it or let us know what is going on and we can go from there. Initial call taken by: Romualdo Bolk, CMA Duncan Dull),  Mar 07, 2010 1:39 PM  Follow-up for Phone Call        agree contact neuro headache office first for  plan  .  Call if we can help.  ALos need results of any evaluation done Follow-up by: Madelin Headings MD,  Mar 07, 2010 5:38 PM  Additional Follow-up for Phone Call Additional follow up Details #1::        Pt's wife aware and will call us if she needs help with anything. Additional Follow-up by: Romualdo Bolk, CMA (AAMA),  Mar 08, 2010 1:31 PM

## 2010-12-05 NOTE — Progress Notes (Signed)
Summary: Pt is req med to take to help his anemia. Pt feels feverish  Phone Note Call from Patient Call back at Home Phone [phone]   Caller: Patient Summary of Call: Pt feels feverish all the time and is anemic. Pt is wondering what med he can take until he can see spec.  Initial call taken by: Lucy Antigua,  September 21, 2010 3:07 PM  Follow-up for Phone Call        Spoke to pt and he is feeling feverish, weak and tired. Body aches and chills as well. No coughing, congestion or sore throat.  I told pt to take motrin or tylenol but limit it to only as needed. Pt is having constant burping. I told pt to try prilosec,zegrid or pepcid otc for this.  Follow-up by: Romualdo Bolk, CMA Duncan Dull),  September 21, 2010 3:16 PM  Additional Follow-up for Phone Call Additional follow up Details #1::        have him take his temperature  and if  100.3 and over  recurring  then call and  reevaluate.   Additional Follow-up by: Madelin Headings MD,  September 22, 2010 8:13 AM    Additional Follow-up for Phone Call Additional follow up Details #2::    Pt aware of this  Follow-up by: Romualdo Bolk, CMA Duncan Dull),  September 22, 2010 8:36 AM
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
## 2010-12-05 NOTE — Assessment & Plan Note (Signed)
Summary: np6/abn echo/jml   Primary Provider:  Dr. Fabian Sharp   History of Present Illness: 55 yo with history of hyperlipidemia was referred for cardiac evaluation given the finding of an abnormal ECG.  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  He is fairly active.  He had been walking 6 miles a day until this winter when he developed left hip pain and stopped his walking program.  No exertional chest pain or shortness of breath.  The hip has improved and he plans to go back to walking again.  He also has occasional pain in his right chest and shoulder that is not clearly related to exertion.  He has hyperlipidemia and has been tried on 2 different statins but was unable to tolerate either, one was Zocor and he cannot remember the other.  No family history of premature CAD.  Patient had an ECG done at Dr. Rosezella [place] office showing anterior T wave flattening and left anterior fascicular block.  ECG here today is similar.    ECG: NSR, LAFB, anterior T wave flattening  Labs (10/10): HDL 42, LDL 146, TSH normal, creatinine 1.0  Current Medications (verified): 1)  Ester-C  Tabs (Bioflavonoid Products) .... Once Daily 2)  Aspirin 81 Mg Tbec (Aspirin) .... Take One Tablet By Mouth Daily 3)  Fish Oil 1000 Mg Caps (Omega-3 Fatty Acids)  Allergies (verified): 1)  ! Naprosyn 2)  ! Feldene  Past History:  Past Medical History: 1. Hypercholesterolemia 2. eval for microsopic hematuria 3. Headaches 4. Right hip pain 5. Right knee arthroscopic surgery.    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  only getting 5 hours of sleep gets up early to pray   Review of Systems       All systems reviewed and negative except as per HPI.    Vital Signs:  Patient profile:   55 year old male Height:      68 inches Weight:      237 pounds BMI:     36.17 Pulse rate:   68 / minute Pulse rhythm:   regular BP sitting:   146 / 92  (left arm) Cuff size:   large  Vitals Entered By: Judithe Modest CMA (January 04, 2010 11:36 AM)  Physical Exam  General:  Well developed, well nourished, in no acute distress. Head:  normocephalic and atraumatic Nose:  no deformity, discharge, inflammation, or lesions Mouth:  Teeth, gums and palate normal. Oral mucosa normal. Neck:  Neck supple, no JVD. No masses, thyromegaly or abnormal cervical nodes. Lungs:  Clear bilaterally to auscultation and percussion. Heart:  Non-displaced PMI, chest non-tender; regular rate and rhythm, S1, S2 without murmurs, rubs or gallops. Carotid upstroke normal, no bruit. Pedals normal pulses. No edema, no varicosities. Abdomen:  Bowel sounds positive; abdomen soft and non-tender without masses, organomegaly, or hernias noted. No hepatosplenomegaly. Msk:  Back normal, normal gait. Muscle strength and tone normal. Extremities:  No clubbing or cyanosis. Neurologic:  Alert and oriented x 3. Skin:  Intact without lesions or rashes. Psych:  Normal affect.   Impression & Recommendations:  Problem # 1:  CHEST PAIN UNSPECIFIED (ICD-786.50) Patient has atypical right-sided chest and shoulder pain that is nonexertional.  He does have a nonspecifically abnormal ECG  with anterior T wave flattening and left anterior fascicular block.  Hyperlipidemia is his main cardiac risk factor.  BP is mildly elevated today but ? due to stress of coming to see a new doctor.  I will set him up to do an ETT and to get an echocardiogram to make sure that the heart is structurally normal.  He will start ASA 81 mg daily and fish oil 2000 mg daily.   Problem # 2:  HYPERLIPIDEMIA NEC/NOS (ICD-272.4) Elevated LDL in 10/10.  He has not tolerated 2 different statins. He has minimal other risk factors  and no strong family history.  Unless there is evidence of coronary disease by ETT or echo, I do not think he has to re-attempt a statin.  Would work on diet and exercise, and as above start fish oil.    Problem # 3:  ELEVATED BLOOD PRESSURE BP was running mildly high.  Will need to follow closely.   Other Orders: EKG w/ Interpretation (93000) Treadmill (Treadmill) Echocardiogram (Echo)  Patient Instructions: 1)  Your physician recommends that you schedule a follow-up appointment in: will see you same day as ECHO test 2)  Your physician has recommended you make the following change in your medication: please start fish oil 2000/day--OTC, ALSO ASA 81MG  PER DAY 3)  Your physician has requested that you have an echocardiogram.  Echocardiography is a painless test that uses sound waves to create images of your heart. It provides your doctor with information about the size and shape of your heart and how well your heart's chambers and valves are working.  This procedure takes approximately one hour. There are no restrictions for this procedure. 4)  Your physician has requested that you have an exercise tolerance test.  For further information please visit [email or web address]  Please also follow instruction sheet, as given.

## 2010-12-05 NOTE — Progress Notes (Signed)
Summary: please return call  Phone Note Call from Patient Call back at Home Phone [phone]   Caller: Patient---live call Summary of Call: returning a call. Initial call taken by: Warnell Forester,  September 26, 2010 1:19 PM  Follow-up for Phone Call        Pt rescheduled his lab appt for tomorrow. Follow-up by: Romualdo Bolk, CMA (AAMA),  September 26, 2010 2:07 PM

## 2010-12-05 NOTE — Letter (Signed)
Summary: [facility] Instructions  [facility]  [address] [place], [place] [address]   Phone: [phone]  Fax: [phone]       Brad Terry    01-19-1956    MRN: [id number]       Procedure Day /Date: Tuesday 12/27/09     Arrival Time: 9:30 am     Procedure Time: 10:30 am     Location of Procedure:                    _x_  [facility] (4th Floor)    PREPARATION FOR COLONOSCOPY WITH MIRALAX  Starting 5 days prior to your procedure 12/22/09 do not eat nuts, seeds, popcorn, corn, beans, peas,  salads, or any raw vegetables.  Do not take any fiber supplements (e.g. Metamucil, Citrucel, and Benefiber). ____________________________________________________________________________________________________   THE DAY BEFORE YOUR PROCEDURE         DATE: 12/26/09 DAY: Monday  1   Drink clear liquids the entire day-NO SOLID FOOD  2   Do not drink anything colored red or purple.  Avoid juices with pulp.  No orange juice.  3   Drink at least 64 oz. (8 glasses) of fluid/clear liquids during the day to prevent dehydration and help the prep work efficiently.  CLEAR LIQUIDS INCLUDE: Water Jello Ice Popsicles Tea (sugar ok, no milk/cream) Powdered fruit flavored drinks Coffee (sugar ok, no milk/cream) Gatorade Juice: apple, white grape, white cranberry  Lemonade Clear bullion, consomm, broth Carbonated beverages (any kind) Strained chicken noodle soup Hard Candy  4   Mix the entire bottle of Miralax with 64 oz. of Gatorade/Powerade in the morning and put in the refrigerator to chill.  5   At 3:00 pm take 2 Dulcolax/Bisacodyl tablets.  6   At 4:30 pm take one Reglan/Metoclopramide tablet.  7  Starting at 5:00 pm drink one 8 oz glass of the Miralax mixture every 15-20 minutes until you have finished drinking the entire 64 oz.  You should finish drinking prep around 7:30 or 8:00 pm.  8   If you are nauseated, you may take the 2nd Reglan/Metoclopramide  tablet at 6:30 pm.        9    At 8:00 pm take 2 more DULCOLAX/Bisacodyl tablets.     THE DAY OF YOUR PROCEDURE      DATE:  12/27/09 DAY: Tuesday  You may drink clear liquids until 8:30 am  (2 HOURS BEFORE PROCEDURE).   MEDICATION INSTRUCTIONS  Unless otherwise instructed, you should take regular prescription medications with a small sip of water as early as possible the morning of your procedure.           OTHER INSTRUCTIONS  You will need a responsible adult at least 55 years of age to accompany you and drive you home.   This person must remain in the waiting room during your procedure.  Wear loose fitting clothing that is easily removed.  Leave jewelry and other valuables at home.  However, you may wish to bring a book to read or an iPod/MP3 player to listen to music as you wait for your procedure to start.  Remove all body piercing jewelry and leave at home.  Total time from sign-in until discharge is approximately 2-3 hours.  You should go home directly after your procedure and rest.  You can resume normal activities the day after your procedure.  The day of your procedure you should not:   Drive  Make legal decisions   Operate machinery   Drink alcohol   Return to work  You will receive specific instructions about eating, activities and medications before you leave.   The above instructions have been reviewed and explained to me by   Clide Cliff, RN______________________    I fully understand and can verbalize these instructions _____________________________ Date _______

## 2010-12-05 NOTE — Procedures (Signed)
Summary: Colonoscopy  Patient: [name] Note: All result statuses are Final unless otherwise noted.  Tests: (1) Colonoscopy (COL)   COL Colonoscopy           DONE     [facility]     [address]     [place], [place]  [address]           COLONOSCOPY PROCEDURE REPORT           PATIENT:  Brad Terry, Brad Terry  MR#:  [id number]     BIRTHDATE:  1956-06-08, 53 yrs. old  GENDER:  male           ENDOSCOPIST:  Hedwig Morton. Juanda Chance, MD     Referred by:  Neta Mends. Panosh, M.D.           PROCEDURE DATE:  12/27/2009     PROCEDURE:  Colonoscopy [address]     ASA CLASS:  Class I     INDICATIONS:  Routine Risk Screening           MEDICATIONS:   Versed 8 mg, Fentanyl 75 mcg           DESCRIPTION OF PROCEDURE:   After the risks benefits and     alternatives of the procedure were thoroughly explained, informed     consent was obtained.  Digital rectal exam was performed and     revealed no rectal masses.   The LB [id number] endoscope     was introduced through the anus and advanced to the cecum, which     was identified by both the appendix and ileocecal valve, without     limitations.  The quality of the prep was good, using MiraLax.     The instrument was then slowly withdrawn as the colon was fully     examined.     <<PROCEDUREIMAGES>>           FINDINGS:  A diminutive polyp was found in the rectum. The polyp     was removed using cold biopsy forceps (see image9 and image8).     Mild diverticulosis was found. one diverticulum in the right colon     This was otherwise a normal examination of the colon (see image1,     image2, image3, image4, image5, image6, and image7).   Retroflexed     views in the rectum revealed no abnormalities.    The scope was     then withdrawn from the patient and the procedure completed.           COMPLICATIONS:  None           ENDOSCOPIC IMPRESSION:     1) Diminutive polyp in the rectum     2) Mild diverticulosis     3) Otherwise normal  examination     RECOMMENDATIONS:     1) Await pathology results     2) High fiber diet.           REPEAT EXAM:  In 10 year(s) for.           ______________________________     Hedwig Morton. Juanda Chance, MD           CC:           n.     eSIGNED:   Hedwig Morton. Lonald Troiani at 12/27/2009 11:48 AM           [name], [name], [name]  Note: An exclamation mark (!) indicates a result that was not dispersed into the  flowsheet. Document Creation Date: 12/27/2009 11:49 AM _______________________________________________________________________  (1) Order result status: Final Collection or observation date-time: 12/27/2009 11:41 Requested date-time:  Receipt date-time:  Reported date-time:  Referring Physician:   Ordering Physician: Lina Sar [phone]) Specimen Source:  Source: Launa Grill Order Number: [id number] Lab site:   Appended Document: Colonoscopy     Procedures Next Due Date:    Colonoscopy: 01/2020

## 2010-12-05 NOTE — Letter (Signed)
Summary: Patient Notice-Endo Biopsy Results  [facility]  [address] [place], [place] [address]   Phone: [phone]  Fax: [phone]        May 27, 2010 MRN: [id number]    Brad Terry [address] [place], [place]  [address]    Dear Mr. FILIP,  I am pleased to inform you that the biopsies taken during your recent endoscopic examination did not show any evidence of cancer upon pathologic examination.The gastric biopsies show chronic inflammation due to acid reflux.  Additional information/recommendations:  __No further action is needed at this time.  Please follow-up with      your primary care physician for your other healthcare needs.  _x_ Please call [phone] to schedule a return visit to review      your condition.  _x_ Continue with the treatment plan as outlined on the day of your      exam.  _   Please call us if you are having persistent problems or have questions about your condition that have not been fully answered at this time.  Sincerely,  Hart Carwin MD  This letter has been electronically signed by your physician.  Appended Document: Patient Notice-Endo Biopsy Results letter mailed

## 2010-12-07 ENCOUNTER — Ambulatory Visit (HOSPITAL_BASED_OUTPATIENT_CLINIC_OR_DEPARTMENT_OTHER): Payer: BC Managed Care – PPO | Admitting: Internal Medicine

## 2010-12-07 DIAGNOSIS — D696 Thrombocytopenia, unspecified: Secondary | ICD-10-CM

## 2010-12-07 DIAGNOSIS — R51 Headache: Secondary | ICD-10-CM

## 2010-12-07 DIAGNOSIS — E7800 Pure hypercholesterolemia, unspecified: Secondary | ICD-10-CM

## 2010-12-07 DIAGNOSIS — Z801 Family history of malignant neoplasm of trachea, bronchus and lung: Secondary | ICD-10-CM

## 2010-12-07 NOTE — Progress Notes (Signed)
Summary: Pt to call GI and see if they can see him today  Phone Note Outgoing Call Call back at Home Phone [phone]   Call placed by: Romualdo Bolk, CMA Duncan Dull),  November 02, 2010 8:47 AM Call placed to: Patient Summary of Call: I called pt about his appt today and explained that we could see him for the rectal bleeding but that it would be better if he saw GI. Dr. Fabian Sharp would be able to do a CBC and exam him but that if there is more going on then we would have to refer him to them. Pt aware of this and will call GI to see if they can see him. Appt cancelled with Dr. Fabian Sharp for today. Initial call taken by: Romualdo Bolk, CMA Duncan Dull),  November 02, 2010 8:49 AM

## 2010-12-07 NOTE — Progress Notes (Signed)
  Phone Note Other Incoming   Caller: [facility] Call: Pharmacy calling to get ok for generic Anusol supp. Ok given  Initial call taken by: Jesse Fall RN,  November 15, 2010 12:00 PM
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
## 2010-12-07 NOTE — Progress Notes (Signed)
Summary: triage  Phone Note Call from Patient Call back at Home Phone [phone]   Caller: spouse [name] Call For: Dr Patterson Hammersmith Reason for Call: Talk to Nurse Summary of Call: Wife states the pt saw blood ini his stool yesterday and wants appt with Dr Juanda Chance, offered him first available 1-25 but wants to be seen sooner than that. Initial call taken by: Tawni Levy,  November 01, 2010 12:06 PM  Follow-up for Phone Call        Spoke with patient and his wife and offered him an appointment with Willette Cluster NP tomorrow. Patient has not been seen here for rectal bleeding, but saw Dr Fabian Sharp on 09/14/10. Dr Fabian Sharp stated patient could have an internal hemorrhoid and she checked a CBC. Patient refused appointment tomorrow stating he felt more comfortable w/ Dr Juanda Chance and he was scheduled for 11/15/10 @ 0845am. Wife had already scheduled an appointment with Dr Fabian Sharp for tomorrow and I encouraged her to keep it d/t the bleeding and to see if his CBC had dropped. Patient instructed to use Tucks, avoid straining, use OTC Preparation H, and take Sitz baths for at least two times a day. Patient and wife stated understanding. Follow-up by: Graciella Freer RN,  November 01, 2010 3:57 PM

## 2010-12-07 NOTE — Letter (Signed)
Summary: [facility]  [facility]   Imported By: Maryln Gottron 10/17/2010 10:58:43  _____________________________________________________________________  External Attachment:    Type:   Image     Comment:   External Document

## 2010-12-13 NOTE — Consult Note (Signed)
Summary: [facility]  [facility]   Imported By: Maryln Gottron 12/04/2010 12:55:39  _____________________________________________________________________  External Attachment:    Type:   Image     Comment:   External Document

## 2012-07-30 ENCOUNTER — Ambulatory Visit (INDEPENDENT_AMBULATORY_CARE_PROVIDER_SITE_OTHER): Payer: BC Managed Care – PPO | Admitting: Internal Medicine

## 2012-07-30 ENCOUNTER — Encounter: Payer: Self-pay | Admitting: Internal Medicine

## 2012-07-30 VITALS — BP 122/86 | HR 64 | Temp 98.9°F | Wt 241.0 lb

## 2012-07-30 DIAGNOSIS — Z8042 Family history of malignant neoplasm of prostate: Secondary | ICD-10-CM

## 2012-07-30 DIAGNOSIS — R3129 Other microscopic hematuria: Secondary | ICD-10-CM

## 2012-07-30 DIAGNOSIS — K625 Hemorrhage of anus and rectum: Secondary | ICD-10-CM

## 2012-07-30 DIAGNOSIS — K649 Unspecified hemorrhoids: Secondary | ICD-10-CM

## 2012-07-30 DIAGNOSIS — Z833 Family history of diabetes mellitus: Secondary | ICD-10-CM

## 2012-07-30 DIAGNOSIS — D696 Thrombocytopenia, unspecified: Secondary | ICD-10-CM

## 2012-07-30 DIAGNOSIS — Z0000 Encounter for general adult medical examination without abnormal findings: Secondary | ICD-10-CM

## 2012-07-30 LAB — POCT URINALYSIS DIPSTICK
Blood, UA: 1
Leukocytes, UA: NEGATIVE
Nitrite, UA: NEGATIVE
Protein, UA: NEGATIVE
pH, UA: 5.5

## 2012-07-30 MED ORDER — HYDROCORTISONE ACETATE 25 MG RE SUPP: 25.0000 mg | Freq: Two times a day (BID) | RECTAL | Status: DC

## 2012-07-30 NOTE — Progress Notes (Signed)
  Subjective:    Patient ID: Brad Terry, male    DOB: 10-09-1956, 56 y.o.   MRN: [id number]  HPI Patient comes in today for SDA for  problem evaluation. Last visit with me was   2011    No entry by me since then  in [facility] and record not abstracted before visit. He complains of recent onset of BR rectal blood in toilet   Over the last few days . He has been having pain and difficulty with his Right shoulder  And  Took advil and then tylenol about 4-5 days ago . Then  3 days ago  br had   Blood in bms  And also next day.  No sig abd pain fever gi illness  Tired ? No fever  Sleepy.   Has hx of rectal bleed  And dx with internal hemorrhoids and given anusol hc supp with help . Had one left over an used this. Also has hx of low platelets and hem eval and no dx or interventional this point. Has been a while since checked.  Review of Systems Neg cp sob tired  Wife says  Stops breathing with sleep   No other bleeding  Still gets HAs    Past history family history social history reviewed in the electronic medical record. In old EHR and paper records.  Current ehr not abstracted.  Hx of low platelets  Followed by hem and felt to be no underlying disease at this time. Has renal cysts and micro hematuria eval by urology in the past Hx of headaches evaluated with mri in past with uln pituitary and ? Had eval  Ortho problems with hip knee shoulder  Had cards eval in past with stress test and ? Other .  Fam hx of dm mom and bro x2  Prostate cancer bro x 2  HT mom and tumor in lung Hx se of 2 statins     Objective:   Physical Exam BP 122/86  Pulse 64  Temp 98.9 F (37.2 C) (Oral)  Wt 241 lb (109.317 kg)  SpO2 98% Wt Readings from Last 3 Encounters:  07/30/12 241 lb (109.317 kg)  11/15/10 236 lb (107.049 kg)  09/14/10 239 lb (108.41 kg)    WDWN in nad  Abdomen:  Sof,t normal bowel sounds without hepatosplenomegaly, no guarding rebound or masses no CVA tenderness Rectal no tags  noted  No masses but hemfelt non tender glove with pibk red mucous heme positive  Prostate 2+ no nodule noted  Alert Oriented x 3 and no noted deficits in memory, attention, and speech. Skin no bruising bleeding seen     Assessment & Plan:  Rectal bleeding  Hx of same 2011  Internal hemorrhoids hx of col on 10 year recall Refill anusol hc and then fu dr Juanda Chance ? Other interventions.  Hx of micro hematuria eval by uro in the past  Small renal cysts. Low plt  Felt to be stable prev hem  evaluation  No bruising or bleeding   . Due for screening and monitoring  labs and anemia checl with above hx .   To get tomorrow . As is late in day .  Fu depending on above .  Record not abstracted  Reviewed old notes, previous EHR; thus not data entry is not complete

## 2012-07-30 NOTE — Patient Instructions (Addendum)
This could be from internal hemorrhoids or lower bowel imflammation.  You saw Dr Juanda Chance in early 2012 .  Would restart the hydrocortisone anusol hc supp for now as she gave you in the past . Plan follow up with her.  you are due for periodic blood work  Please make appt for fasting lab tomorrow And then plan follow up with Dr  Juanda Chance

## 2012-07-31 ENCOUNTER — Other Ambulatory Visit: Payer: BC Managed Care – PPO

## 2012-07-31 LAB — CBC WITH DIFFERENTIAL/PLATELET
Basophils Relative: 0.3 % (ref 0.0–3.0)
Hemoglobin: 12.6 g/dL — ABNORMAL LOW (ref 13.0–17.0)
Lymphocytes Relative: 27.3 % (ref 12.0–46.0)
Lymphs Abs: 1.2 10*3/uL (ref 0.7–4.0)
MCHC: 32.6 g/dL (ref 30.0–36.0)
MCV: 88.7 fl (ref 78.0–100.0)
Neutro Abs: 2.6 10*3/uL (ref 1.4–7.7)
RDW: 13.7 % (ref 11.5–14.6)

## 2012-07-31 LAB — LIPID PANEL
HDL: 39.9 mg/dL (ref >39.00)
Total CHOL/HDL Ratio: 5
VLDL: 12.4 mg/dL (ref 0.0–40.0)

## 2012-07-31 LAB — BASIC METABOLIC PANEL
CO2: 26 mEq/L (ref 19–32)
Calcium: 9 mg/dL (ref 8.4–10.5)
Chloride: 106 mEq/L (ref 96–112)
Glucose, Bld: 88 mg/dL (ref 70–99)
Sodium: 141 mEq/L (ref 135–145)

## 2012-07-31 LAB — HEPATIC FUNCTION PANEL
ALT: 18 U/L (ref 0–53)
AST: 18 U/L (ref 0–37)
Bilirubin, Direct: 0.1 mg/dL (ref 0.0–0.3)
Total Bilirubin: 0.6 mg/dL (ref 0.3–1.2)

## 2012-07-31 LAB — LDL CHOLESTEROL, DIRECT: Direct LDL: 140.2 mg/dL

## 2012-08-04 ENCOUNTER — Encounter: Payer: Self-pay | Admitting: Internal Medicine

## 2012-08-04 DIAGNOSIS — Z8042 Family history of malignant neoplasm of prostate: Secondary | ICD-10-CM | POA: Insufficient documentation

## 2012-08-04 DIAGNOSIS — Z833 Family history of diabetes mellitus: Secondary | ICD-10-CM | POA: Insufficient documentation

## 2012-08-08 ENCOUNTER — Other Ambulatory Visit: Payer: Self-pay | Admitting: Internal Medicine

## 2012-08-08 DIAGNOSIS — R972 Elevated prostate specific antigen [PSA]: Secondary | ICD-10-CM

## 2012-08-19 ENCOUNTER — Encounter: Payer: Self-pay | Admitting: Internal Medicine

## 2012-08-19 ENCOUNTER — Telehealth: Payer: Self-pay | Admitting: Internal Medicine

## 2012-08-19 DIAGNOSIS — R972 Elevated prostate specific antigen [PSA]: Secondary | ICD-10-CM

## 2012-08-19 NOTE — Telephone Encounter (Signed)
Pt would like to come back and get his PSA  Levels checked again before he sees the urologist

## 2012-08-19 NOTE — Telephone Encounter (Signed)
Ok to repeat psa  Dx elevated psa but I want him to see urologist either way .

## 2012-08-20 ENCOUNTER — Other Ambulatory Visit (INDEPENDENT_AMBULATORY_CARE_PROVIDER_SITE_OTHER): Payer: BC Managed Care – PPO

## 2012-08-20 DIAGNOSIS — R972 Elevated prostate specific antigen [PSA]: Secondary | ICD-10-CM

## 2012-08-20 LAB — PSA: PSA: 1.18 ng/mL (ref 0.10–4.00)

## 2012-08-20 NOTE — Telephone Encounter (Signed)
appt scheduled and order placed

## 2012-08-21 NOTE — Progress Notes (Signed)
Quick Note:  Pt informed on VM ______ 

## 2012-09-04 ENCOUNTER — Encounter: Payer: Self-pay | Admitting: *Deleted

## 2012-09-24 ENCOUNTER — Encounter: Payer: Self-pay | Admitting: Internal Medicine

## 2012-09-24 ENCOUNTER — Ambulatory Visit (INDEPENDENT_AMBULATORY_CARE_PROVIDER_SITE_OTHER): Payer: BC Managed Care – PPO | Admitting: Internal Medicine

## 2012-09-24 VITALS — BP 110/76 | HR 60 | Ht 68.0 in | Wt 239.0 lb

## 2012-09-24 DIAGNOSIS — K625 Hemorrhage of anus and rectum: Secondary | ICD-10-CM

## 2012-09-24 DIAGNOSIS — K648 Other hemorrhoids: Secondary | ICD-10-CM

## 2012-09-24 MED ORDER — HYDROCORTISONE ACETATE 25 MG RE SUPP: 25.0000 mg | Freq: Every day | RECTAL | Status: DC

## 2012-09-24 NOTE — Patient Instructions (Addendum)
We have sent the following medications to your pharmacy for you to pick up at your convenience: Anusol CC: Dr Fabian Sharp

## 2012-09-24 NOTE — Progress Notes (Signed)
Brad Terry [birth year] MRN [id number]  History of Present Illness:  This is a 56 year old African American male with symptomatic hemorrhoids. We saw him in January 2012 for rectal bleeding which responded to [facility] suppositories. He had a recent exacerbation and again took a course of Anusol-HC suppositories twice a day with complete resolution of the rectal bleeding. He is still complaining of rectal fullness and discomfort when he stands up. His last colonoscopy in February 2011 showed a hyperplastic polyp. His last hemoglobin was 12.7, hematocrit was 36.9 and MCV was 83. He has known thrombocytopenia. An upper endoscopy in July 2011 showed an irregular Z line and mild gastritis with negative H. pylori.   Past Medical History  Diagnosis Date  . URTICARIA, ALLERGIC 12/30/2007    Qualifier: Diagnosis of  By: Clent Ridges MD, [facility] CHEST PAIN UNSPECIFIED 01/04/2010    Qualifier: Diagnosis of  By: Shirlee Latch, MD, [name]    . Renal cyst   . Headaches due to old head injury   . Internal hemorrhoids   . Hyperlipidemia   . Diverticulosis    Past Surgical History  Procedure Date  . Knee arthroscopy     right    reports that he has never smoked. He has never used smokeless tobacco. He reports that he does not drink alcohol or use illicit drugs. family history includes Diabetes in his brother and mother and Prostate cancer in his brother.  There is no history of Colon cancer. Allergies  Allergen Reactions  . Naproxen   . Piroxicam     REACTION: tinnitus  . Statins     Hx of se of 2 statins         Review of Systems: Denies abdominal pain or constipation  The remainder of the 10 point ROS is negative except as outlined in H&P   Physical Exam: General appearance  Well developed, in no distress. Eyes- non icteric. HEENT nontraumatic, normocephalic. Mouth no lesions, tongue papillated, no cheilosis. Neck supple without adenopathy, thyroid not enlarged, no carotid bruits, no  JVD. Lungs Clear to auscultation bilaterally. Cor normal S1, normal S2, regular rhythm, no murmur,  quiet precordium. Abdomen: Soft nontender. Rectal: And anoscopic exam reveals normal perianal area. Normal rectal sphincter tone. At least 3 first grade hemorrhoids which are partially prolapsing into anal canal. They are edematous and red. There is no thrombosis. No fresh blood. Stool is Hemoccult negative. Extremities no pedal edema. Skin no lesions. Neurological alert and oriented x 3. Psychological normal mood and affect.  Assessment and Plan:  Problem #1 Symptomatic first grade internal hemorrhoids. Bleeding has improved after a short course of Anusol-HC suppositories. We will refill his suppositories. I have discussed with him the possibility of band ligation of the hemorrhoids. He will prefer to use suppositories over a longer period of time and if the discomfort persists or if the bleeding recurs, he will call us and we will schedule him for a flexible sigmoidoscopy with band ligation.   09/24/2012 Lina Sar

## 2012-11-10 ENCOUNTER — Other Ambulatory Visit: Payer: Self-pay | Admitting: Internal Medicine

## 2012-11-10 NOTE — Telephone Encounter (Signed)
Amoxacillin 500 mg, #21, 1 po tid, no refill

## 2012-12-02 ENCOUNTER — Ambulatory Visit (INDEPENDENT_AMBULATORY_CARE_PROVIDER_SITE_OTHER): Payer: BC Managed Care – PPO | Admitting: Physician Assistant

## 2012-12-02 ENCOUNTER — Telehealth: Payer: Self-pay | Admitting: Internal Medicine

## 2012-12-02 ENCOUNTER — Encounter: Payer: Self-pay | Admitting: Physician Assistant

## 2012-12-02 VITALS — BP 100/70 | HR 68 | Ht 70.0 in | Wt 238.0 lb

## 2012-12-02 DIAGNOSIS — K6289 Other specified diseases of anus and rectum: Secondary | ICD-10-CM

## 2012-12-02 MED ORDER — CEPHALEXIN 250 MG PO CAPS: 250.0000 mg | ORAL_CAPSULE | Freq: Three times a day (TID) | ORAL | Status: DC

## 2012-12-02 NOTE — Telephone Encounter (Signed)
Spoke with patient and he states for the last few days he has had anal pain and nausea. He states he can feel a "growth" in the anal area. Denies bleeding. Hx hemorrhoids. Scheduled with Mike Gip, PA today at 11:00 AM.

## 2012-12-02 NOTE — Patient Instructions (Addendum)
We sent a prescription to [facility], [facility] for Keflex. Take as directted. Soak in a hot bathtub 2-3 times daily.    We have given you samples of Recticare for rectal pain.  Use a small amount in the rectal area 4-5 times daily. Use this after you have sat in a hot tub.   Call us back on Friday at [phone], choose option 2 for nurse and ask for [facility].  We would like to know how you are doing.

## 2012-12-02 NOTE — Progress Notes (Signed)
Subjective:    Patient ID: Brad Terry, male    DOB: 1956/03/16, 57 y.o.   MRN: [id number]  HPI  Brad Terry is a pleasant 57 year old African male known to Dr. Lina Sar who has history of internal hemorrhoids and was last seen in November of 2013. He had colonoscopy in February of 2011 with finding of one hyperplastic polyp and also mild diverticular disease. He comes in today with complaints of tenderness and discomfort on the right side of his anus which she says is been present for about a week. He has not noted any bleeding with this, he does have mild constipation problems but states this is been no different than usual. He has no complaints of abdominal pain, he mentions a more chronic pain in his right hip and buttock and also some pain in his left back along the costal margin which he says is been present for a long time. He has not noted any fever or chills, he has had some vague nausea.    Review of Systems  Constitutional: Negative.   HENT: Positive for postnasal drip.   Eyes: Negative.   Respiratory: Negative.   Cardiovascular: Negative.   Gastrointestinal: Positive for rectal pain.  Genitourinary: Negative.   Musculoskeletal: Positive for back pain and arthralgias.  Neurological: Negative.   Hematological: Negative.   Psychiatric/Behavioral: Negative.    Outpatient Prescriptions Prior to Visit  Medication Sig Dispense Refill  . hydrocortisone (ANUSOL-HC) 25 MG suppository Place 1 suppository (25 mg total) rectally at bedtime.  12 suppository  4   Last reviewed on 12/02/2012 12:33 PM by Sammuel Cooper, PA      Allergies  Allergen Reactions  . Naproxen   . Piroxicam     REACTION: tinnitus  . Statins     Hx of se of 2 statins    Patient Active Problem List  Diagnosis  . HYPERLIPIDEMIA NEC/NOS  . ANEMIA  . UNSPECIFIED THROMBOCYTOPENIA  . LEUKOCYTOPENIA UNSPECIFIED  . HEMORRHOIDS  . RHINITIS  . DIVERTICULOSIS, COLON  . RECTAL BLEEDING  . HIP PAIN,  RIGHT  . KNEE PAIN, RIGHT  . NECK PAIN  . MEMORY LOSS  . Headache  . ERUCTATION  . FLANK PAIN, LEFT  . MRI, BRAIN, ABNORMAL  . NONSPECIFIC ABNORMAL ELECTROCARDIOGRAM  . UNS ADVRS EFF OTH RX MEDICINAL&BIOLOGICAL SBSTNC  . COLONIC POLYPS, BENIGN, HX OF  . HEMATURIA, MICROSCOPIC, HX OF  . Family history of diabetes mellitus type II  . Family hx of prostate cancer   History  Substance Use Topics  . Smoking status: Never Smoker   . Smokeless tobacco: Never Used  . Alcohol Use: No   family history includes Diabetes in his brother and mother and Prostate cancer in his brother.  There is no history of Colon cancer.  Objective:   Physical Exam well-developed African male in no acute distress. Blood pressure 100/70 pulse 68 height 5 foot 10 weight 238. HEENT; nontraumatic normocephalic EOMI PERRLA sclera anicteric,Neck; Supple no JVD, Cardiovascular;regular rate and rhythm with S1-S2 no murmur or gallop, Pulmonary; clear bilaterally, Abdomen; soft nontender nondistended bowel sounds are active, Rectal exam ;on external exam he has a  Pea-sized lesion on the right side of the anus which is somewhat erythematous and tender to the touch and consistent with a very small perirectal abscess, on digital exam there is no fluctuance internal ,and no real tenderness internally. There  is no evidence of any perirectal cellulitis.  Extremities; no clubbing cyanosis or edema skin warm  and dry, Psych; mood and affect normal and appropriate        Assessment & Plan:  #66  57 year old male with what appears to be a very small perirectal abscess/boil on the right anus. #2 history of internal hemorrhoids #3 hyperplastic colon polyp last colonoscopy 2011  Plan; hot   tub soaks 3-4 times daily Lidocaine gel 5% apply as needed during the day for discomfort Keflex 250 mg by mouth 3 times daily x7 days Patient is asked to call back in 72 hours with a progress report-if he has not had any significant relief Will  refer to [facility] for local I&D.

## 2012-12-03 NOTE — Progress Notes (Signed)
Reviewed and agree with rectal care and an antibiotic coverage

## 2012-12-20 ENCOUNTER — Other Ambulatory Visit: Payer: Self-pay

## 2013-04-17 ENCOUNTER — Ambulatory Visit (INDEPENDENT_AMBULATORY_CARE_PROVIDER_SITE_OTHER): Payer: BC Managed Care – PPO | Admitting: Internal Medicine

## 2013-04-17 ENCOUNTER — Encounter: Payer: Self-pay | Admitting: Internal Medicine

## 2013-04-17 VITALS — BP 134/84 | HR 62 | Temp 98.1°F | Wt 240.0 lb

## 2013-04-17 DIAGNOSIS — R109 Unspecified abdominal pain: Secondary | ICD-10-CM

## 2013-04-17 DIAGNOSIS — R0981 Nasal congestion: Secondary | ICD-10-CM

## 2013-04-17 DIAGNOSIS — M545 Low back pain: Secondary | ICD-10-CM

## 2013-04-17 DIAGNOSIS — M25511 Pain in right shoulder: Secondary | ICD-10-CM | POA: Insufficient documentation

## 2013-04-17 DIAGNOSIS — M542 Cervicalgia: Secondary | ICD-10-CM

## 2013-04-17 DIAGNOSIS — J3489 Other specified disorders of nose and nasal sinuses: Secondary | ICD-10-CM

## 2013-04-17 DIAGNOSIS — M25519 Pain in unspecified shoulder: Secondary | ICD-10-CM
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
## 2013-04-17 NOTE — Patient Instructions (Addendum)
The pains seem like mechanical pains or pinched nerves and possibly a shoulder problem.  Try aleve  once a day for a week in the short run to see if it calms down the pain.  I can't explain the left back pain after eating but will get an ultrasound of the abdomen kidneys to check the area. If there are concerns we can have you go back to the urologist.  Maryclare Labrador arrange a referral to sports medicine regard to your pain and shoulder for evaluation.  Okay to take Claritin for allergy.  You had an MRI of your head and neck in 2011.  You will be contacted about   Tests and referral

## 2013-04-22 ENCOUNTER — Ambulatory Visit
Admission: RE | Admit: 2013-04-22 | Discharge: 2013-04-22 | Disposition: A | Discharge status: Home / Self Care | Payer: BC Managed Care – PPO | Source: Ambulatory Visit | Attending: Internal Medicine | Admitting: Internal Medicine

## 2013-04-22 DIAGNOSIS — M545 Low back pain, unspecified: Secondary | ICD-10-CM

## 2013-04-22 DIAGNOSIS — R109 Unspecified abdominal pain: Secondary | ICD-10-CM

## 2013-05-25 ENCOUNTER — Ambulatory Visit
Admission: RE | Admit: 2013-05-25 | Discharge: 2013-05-25 | Disposition: A | Discharge status: Home / Self Care | Payer: BC Managed Care – PPO | Source: Ambulatory Visit | Attending: Family Medicine | Admitting: Family Medicine

## 2013-05-25 ENCOUNTER — Ambulatory Visit (INDEPENDENT_AMBULATORY_CARE_PROVIDER_SITE_OTHER): Payer: BC Managed Care – PPO | Admitting: Family Medicine

## 2013-05-25 ENCOUNTER — Encounter: Payer: Self-pay | Admitting: Family Medicine

## 2013-05-25 VITALS — BP 121/84 | HR 71 | Ht 70.0 in | Wt 240.0 lb

## 2013-05-25 DIAGNOSIS — M25551 Pain in right hip: Secondary | ICD-10-CM

## 2013-05-25 DIAGNOSIS — M25559 Pain in unspecified hip: Secondary | ICD-10-CM

## 2013-05-25 DIAGNOSIS — M545 Low back pain, unspecified: Secondary | ICD-10-CM

## 2013-05-25 DIAGNOSIS — S43429A Sprain of unspecified rotator cuff capsule, initial encounter: Secondary | ICD-10-CM

## 2013-05-25 DIAGNOSIS — S43421A Sprain of right rotator cuff capsule, initial encounter: Secondary | ICD-10-CM

## 2013-05-26 ENCOUNTER — Encounter: Payer: Self-pay | Admitting: Family Medicine

## 2013-05-27 ENCOUNTER — Encounter: Payer: Self-pay | Admitting: Family Medicine

## 2013-05-27 DIAGNOSIS — S43429A Sprain of unspecified rotator cuff capsule, initial encounter: Secondary | ICD-10-CM | POA: Insufficient documentation

## 2013-05-27 HISTORY — DX: Sprain of unspecified rotator cuff capsule, initial encounter: S43.429A

## 2013-05-27 NOTE — Assessment & Plan Note (Signed)
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 Will start him on exercise program and see him back in 3-4 weeks. At that time we'll see if he wants to pursue corticosteroid injection if he's not improving.

## 2013-05-27 NOTE — Assessment & Plan Note (Signed)
Right hip, pelvis and low back pain. He is very concerned this is related to his 3 falls. 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

## 2013-05-27 NOTE — Progress Notes (Signed)
  Subjective:    Patient ID: Brad Terry, male    DOB: [birth year], 57 y.o.   MRN: [id number]  HPI  #1. Many months of low back and right hip pain that sometimes extends into his right leg. He reports she's had 3 falls onto his tailbone and the pain has seemed worse each time he sat up fall. He has never been seen with x-rays. He has no numbness in the leg with some shooting pain down the front of the thigh and in the groin to the knee. Does not extend beyond the knee. No locking or giving way of the leg. No bowel or bladder incontinence. No numbness in the leg or foot. #2. Right shoulder pain. He is right-hand dominant. Pain is worse with overhead motions. Has had no injury and no surgery to the right shoulder. No numbness in the hand.  Review of Systems Denies fever, sweats, chills or unusual weight loss.    Objective:   Physical Exam Vital signs reviewed GENERAL: Well-developed male slightly overweight in no acute distress SHOULDER: Full range of motion in all planes the rotator cuff with intact strength. He has pain with supraspinatus testing. The shoulder joint is located. Negative apprehension test. HIP: Right. Internal/external rotation is full and painless. Negative straight leg raise. Pelvis is nontender to palpation and to lateral compression. Distally he is neurovascularly intact in lower extremities with intact symmetrical strength.       Assessment & Plan:

## 2013-09-10 ENCOUNTER — Other Ambulatory Visit: Payer: Self-pay

## 2013-12-28 ENCOUNTER — Other Ambulatory Visit: Payer: Self-pay | Admitting: Internal Medicine

## 2013-12-30 ENCOUNTER — Other Ambulatory Visit: Payer: Self-pay | Admitting: Internal Medicine

## 2014-04-26 ENCOUNTER — Encounter: Payer: Self-pay | Admitting: Internal Medicine

## 2014-04-26 ENCOUNTER — Ambulatory Visit (INDEPENDENT_AMBULATORY_CARE_PROVIDER_SITE_OTHER): Payer: BC Managed Care – PPO | Admitting: Internal Medicine

## 2014-04-26 VITALS — BP 126/86 | Temp 98.4°F | Ht 68.0 in | Wt 236.0 lb

## 2014-04-26 DIAGNOSIS — L989 Disorder of the skin and subcutaneous tissue, unspecified: Secondary | ICD-10-CM

## 2014-04-26 DIAGNOSIS — E785 Hyperlipidemia, unspecified: Secondary | ICD-10-CM

## 2014-04-26 DIAGNOSIS — M25511 Pain in right shoulder: Secondary | ICD-10-CM | POA: Insufficient documentation

## 2014-04-26 DIAGNOSIS — M25519 Pain in unspecified shoulder: Secondary | ICD-10-CM

## 2014-04-26 DIAGNOSIS — K625 Hemorrhage of anus and rectum: Secondary | ICD-10-CM

## 2014-04-26 DIAGNOSIS — M545 Low back pain, unspecified: Secondary | ICD-10-CM | POA: Insufficient documentation

## 2014-04-26 DIAGNOSIS — R42 Dizziness and giddiness: Secondary | ICD-10-CM

## 2014-04-26 MED ORDER — HYDROCORTISONE ACETATE 25 MG RE SUPP: 25.0000 mg | Freq: Every day | RECTAL | Status: DC

## 2014-04-26 NOTE — Patient Instructions (Addendum)
Sports medicine   Referral to dr Tamala Julian about the shoulder  Orthopedics   Because your bleeding has recurred  After a few years absence .   And hx of small polyp. Will  Flag and refer to dr Olevia Perches to make sure of the dx .and not a need to  Repeat a colonoscopy or now this year. Checking for anemia and diabetes etc.  For now do not take antiinflammatories cause of your bleeding hx .  Will notify you  of labs when available. ROV in 1-2 months    cpx  Or as needed   If bleeding is worse call on call service and seek emergent care  If needed
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
## 2014-04-27 LAB — CBC WITH DIFFERENTIAL/PLATELET
BASOS ABS: 0 10*3/uL (ref 0.0–0.1)
Basophils Relative: 0.4 % (ref 0.0–3.0)
Eosinophils Absolute: 0.2 10*3/uL (ref 0.0–0.7)
Eosinophils Relative: 5.2 % — ABNORMAL HIGH (ref 0.0–5.0)
HEMATOCRIT: 40.5 % (ref 39.0–52.0)
HEMOGLOBIN: 13.1 g/dL (ref 13.0–17.0)
Lymphocytes Relative: 34.8 % (ref 12.0–46.0)
Lymphs Abs: 1.6 10*3/uL (ref 0.7–4.0)
MCHC: 32.3 g/dL (ref 30.0–36.0)
MCV: 89.9 fl (ref 78.0–100.0)
MONOS PCT: 7.1 % (ref 3.0–12.0)
Monocytes Absolute: 0.3 10*3/uL (ref 0.1–1.0)
Neutro Abs: 2.4 10*3/uL (ref 1.4–7.7)
Neutrophils Relative %: 52.5 % (ref 43.0–77.0)
Platelets: 111 10*3/uL — ABNORMAL LOW (ref 150.0–400.0)
RBC: 4.5 Mil/uL (ref 4.22–5.81)
RDW: 14.3 % (ref 11.5–15.5)
WBC: 4.6 10*3/uL (ref 4.0–10.5)

## 2014-04-27 LAB — HEPATIC FUNCTION PANEL
ALT: 19 U/L (ref 0–53)
AST: 22 U/L (ref 0–37)
Albumin: 4.4 g/dL (ref 3.5–5.2)
Alkaline Phosphatase: 50 U/L (ref 39–117)
Bilirubin, Direct: 0 mg/dL (ref 0.0–0.3)
Total Bilirubin: 0.7 mg/dL (ref 0.2–1.2)
Total Protein: 7.2 g/dL (ref 6.0–8.3)

## 2014-04-27 LAB — BASIC METABOLIC PANEL
BUN: 18 mg/dL (ref 6–23)
CHLORIDE: 105 meq/L (ref 96–112)
CO2: 27 meq/L (ref 19–32)
Calcium: 9.2 mg/dL (ref 8.4–10.5)
Creatinine, Ser: 1.1 mg/dL (ref 0.4–1.5)
GFR: 92.19 mL/min (ref >60.00)
GLUCOSE: 87 mg/dL (ref 70–99)
POTASSIUM: 3.8 meq/L (ref 3.5–5.1)
SODIUM: 139 meq/L (ref 135–145)

## 2014-04-27 LAB — LIPID PANEL
CHOL/HDL RATIO: 5
Cholesterol: 204 mg/dL — ABNORMAL HIGH (ref 0–200)
HDL: 39.8 mg/dL (ref >39.00)
LDL Cholesterol: 131 mg/dL — ABNORMAL HIGH (ref 0–99)
NONHDL: 164.2
Triglycerides: 166 mg/dL — ABNORMAL HIGH (ref 0.0–149.0)
VLDL: 33.2 mg/dL (ref 0.0–40.0)

## 2014-04-27 LAB — T4, FREE: Free T4: 0.84 ng/dL (ref 0.60–1.60)

## 2014-04-27 LAB — TSH: TSH: 2 u[IU]/mL (ref 0.35–4.50)

## 2014-04-27 LAB — HEMOGLOBIN A1C: Hgb A1c MFr Bld: 6.5 % (ref 4.6–6.5)

## 2014-06-01 ENCOUNTER — Encounter: Payer: Self-pay | Admitting: Internal Medicine

## 2014-06-01 ENCOUNTER — Ambulatory Visit (INDEPENDENT_AMBULATORY_CARE_PROVIDER_SITE_OTHER): Payer: BC Managed Care – PPO | Admitting: Internal Medicine

## 2014-06-01 VITALS — BP 122/84 | HR 60 | Ht 68.0 in | Wt 238.1 lb

## 2014-06-01 DIAGNOSIS — K649 Unspecified hemorrhoids: Secondary | ICD-10-CM

## 2014-06-01 DIAGNOSIS — K625 Hemorrhage of anus and rectum: Secondary | ICD-10-CM

## 2014-06-01 DIAGNOSIS — K648 Other hemorrhoids: Secondary | ICD-10-CM

## 2014-06-01 MED ORDER — OMEPRAZOLE 40 MG PO CPDR: 40.0000 mg | DELAYED_RELEASE_CAPSULE | Freq: Every day | ORAL | Status: DC

## 2014-06-01 MED ORDER — HYDROCORTISONE ACETATE 25 MG RE SUPP: 25.0000 mg | Freq: Every day | RECTAL | Status: DC

## 2014-06-01 NOTE — Patient Instructions (Signed)
We have sent the following medications to your pharmacy for you to pick up at your convenience: Anusol Suppositories Prilosec   Please purchase the following medications over the counter and take as directed: Probiotic once daily (Align, Florastor are examples)  Please drink prune juice 2-3 times per week for constipation.  If bleeding continues, we may need to band your hemorrhoids. Please call us at [phone] and ask to speak to Rollene Fare should symptoms persist.  CC:Dr Shanon Ace

## 2014-06-01 NOTE — Progress Notes (Signed)
Brad Terry [birth year] [id number]  Note: This dictation was prepared with Dragon digital system. Any transcriptional errors that result from this procedure are unintentional.   History of Present Illness:  This is a 58 year old African American male with low-volume painless rectal bleeding which occurred about 3 weeks ago and has responded to Anusol [facility] suppositories given by Dr. Regis Bill. The bleeding stopped after using 4 suppositories. He has a history of hemorrhoids. His last colonoscopy in February 2011 showed a hyperplastic polyp and mild diverticulosis. He had a rectal abscess in January 2014 which responded to Keflex antibiotics and lidocaine gel 5%. He also has some vague pain on the side over his back which starts in the lumbosacral area and radiates up to his shoulders and bothers him at night or if he moves his arms. He has taken Aleve for it. He has mild constipation for which he takes prune juice once or twice a week. He complains of belching and occasional heartburn. He had an normal  upper endoscopy 05/23/2010.    Past Medical History  Diagnosis Date  . 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 Renal cyst   . Headaches due to old head injury   . Internal hemorrhoids   . Hyperlipidemia   . Diverticulosis     Past Surgical History  Procedure Laterality Date  . Knee arthroscopy      right    Allergies  Allergen Reactions  . Naproxen   . Piroxicam     REACTION: tinnitus  . Statins     Hx of se of 2 statins     Family history and social history have been reviewed.  Review of Systems: Negative for dysphagia. Positive for low-volume rectal bleeding  The remainder of the 10 point ROS is negative except as outlined in the H&P  Physical Exam: General Appearance Well developed, in no distress Eyes  Non icteric  HEENT  Non  traumatic, normocephalic  Mouth No lesion, tongue papillated, no cheilosis Neck Supple without adenopathy, thyroid not enlarged, no carotid bruits, no JVD Lungs Clear to auscultation bilaterally COR Normal S1, normal S2, regular rhythm, no murmur, quiet precordium Abdomen soft, nontender. Normoactive bowel sounds. No tympany. Liver edge at costal margin. Tenderness along costovertebral angles extending to the shoulder blades and shoulders consistent with musculoskeletal pain. Rectal and anoscopic exam reveals normal perianal area and anal sphincter. Three  first-grade hemorrhoids internally which are edematous and full of  blood but there is no active bleeding. Stool is Hemoccult negative. There is no prolapse Extremities  No pedal edema Skin No lesions Neurological Alert and oriented x 3 Psychological Normal mood and affect  Assessment and Plan:   Problem #85 58 year old [place] American male with symptomatic first-grade internal hemorrhoids causing low-volume rectal bleeding. He responded to [facility] suppositories. The hemorrhoids however are still edematous and I have suggested that he uses the rest of the suppositories. We will make sure he has refills on the [facility] suppositories. We have also discussed possible banding of the hemorrhoids since he has had bleeding over the years off and on. He may be interested in hemorrhoidal banding if bleeding keeps recurring on a regular basis. He will continue prune 2 juice as a laxative and I suggested using probiotics to reduce the gas and promote digestion.  Problem #2 Dyspepsia. Patient is  to start Prilosec 40 mg daily.  Problem #3 Musculoskeletal pain. I suggest a followup with Dr.Panosh to further diagnose his arthritic complaints. We gave him Voltaren in the past    Delfin Edis 06/01/2014

## 2014-06-30 ENCOUNTER — Encounter: Payer: Self-pay | Admitting: Internal Medicine

## 2014-06-30 ENCOUNTER — Ambulatory Visit (INDEPENDENT_AMBULATORY_CARE_PROVIDER_SITE_OTHER): Payer: BC Managed Care – PPO | Admitting: Internal Medicine

## 2014-06-30 VITALS — BP 122/78 | Temp 98.1°F | Ht 68.5 in | Wt 238.0 lb

## 2014-06-30 DIAGNOSIS — R202 Paresthesia of skin: Secondary | ICD-10-CM

## 2014-06-30 DIAGNOSIS — M542 Cervicalgia: Secondary | ICD-10-CM

## 2014-06-30 DIAGNOSIS — M25511 Pain in right shoulder: Secondary | ICD-10-CM

## 2014-06-30 DIAGNOSIS — R209 Unspecified disturbances of skin sensation: Secondary | ICD-10-CM

## 2014-06-30 DIAGNOSIS — M25519 Pain in unspecified shoulder: Secondary | ICD-10-CM

## 2014-06-30 DIAGNOSIS — R42 Dizziness and giddiness: Secondary | ICD-10-CM
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
## 2014-06-30 NOTE — Patient Instructions (Signed)
Will be refer to ortho dr Lynann Bologna about the shoulder and possible  Pinched nervefr in neck.  It is possible that treating the shoulder will help resolve the other arm and neck pain .   The hip leg could be  Pinched nerve or old injury no other intervention at this time . Will review you record regarding the brain evaluation and neurology evaluation from the past to make sure nothing more needs to be followed up at this time.

## 2014-11-12 ENCOUNTER — Ambulatory Visit (INDEPENDENT_AMBULATORY_CARE_PROVIDER_SITE_OTHER): Payer: BC Managed Care – PPO | Admitting: Family Medicine

## 2014-11-12 ENCOUNTER — Encounter: Payer: Self-pay | Admitting: Family Medicine

## 2014-11-12 VITALS — BP 120/80 | HR 65 | Temp 98.2°F | Wt 237.0 lb

## 2014-11-12 DIAGNOSIS — R319 Hematuria, unspecified: Secondary | ICD-10-CM

## 2014-11-12 DIAGNOSIS — R109 Unspecified abdominal pain: Secondary | ICD-10-CM

## 2014-11-12 DIAGNOSIS — R0982 Postnasal drip: Secondary | ICD-10-CM

## 2014-11-12 LAB — POCT URINALYSIS DIPSTICK
Bilirubin, UA: NEGATIVE
Glucose, UA: NEGATIVE
Ketones, UA: NEGATIVE
LEUKOCYTES UA: NEGATIVE
NITRITE UA: NEGATIVE
Protein, UA: NEGATIVE
Spec Grav, UA: 1.02
Urobilinogen, UA: 0.2
pH, UA: 5.5

## 2014-11-12 LAB — URINALYSIS, MICROSCOPIC ONLY

## 2014-11-12 MED ORDER — AZELASTINE HCL 0.1 % NA SOLN: 2.0000 | Freq: Two times a day (BID) | NASAL | Status: DC

## 2014-11-12 NOTE — Progress Notes (Signed)
   Subjective:    Patient ID: Brad Terry, male    DOB: [birth year], 59 y.o.   MRN: [id number]  HPI Patient seen with nausea, postnasal drainage, and sinus pressure over the past several days. No colored nasal discharge. No fever or chills. He thinks nausea may be coming from postnasal drainage. He has taken Zyrtec without relief. No abdominal pain. He also complains of some chronic left flank pain which has been evaluated previously with ultrasound unremarkable. No recent urinary changes. No gross hematuria. No appetite or weight changes. No alleviating or exacerbating factors.  Past Medical History  Diagnosis Date  . 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 Renal cyst   . Headaches due to old head injury   . Internal hemorrhoids   . Hyperlipidemia   . Diverticulosis    Past Surgical History  Procedure Laterality Date  . Knee arthroscopy      right    reports that he has never smoked. He has never used smokeless tobacco. He reports that he does not drink alcohol or use illicit drugs. family history includes Diabetes in his brother and mother; Prostate cancer in his brother. There is no history of Colon cancer. Allergies  Allergen Reactions  . Naproxen   . Piroxicam     REACTION: tinnitus  . Statins     Hx of se of 2 statins       Review of Systems  Constitutional: Negative for fever, chills, appetite change and unexpected weight change.  HENT: Positive for postnasal drip.   Cardiovascular: Negative for chest pain.  Gastrointestinal: Positive for nausea. Negative for vomiting, abdominal pain, constipation and blood in stool.  Genitourinary: Positive for flank pain. Negative for dysuria, hematuria and difficulty urinating.  Skin: Negative for rash.  Hematological: Negative for adenopathy.       Objective:   Physical Exam    Constitutional: He appears well-developed and well-nourished.  HENT:  Right Ear: External ear normal.  Left Ear: External ear normal.  Mouth/Throat: Oropharynx is clear and moist.  Neck: Neck supple.  Cardiovascular: Normal rate and regular rhythm.   Pulmonary/Chest: Effort normal and breath sounds normal. No respiratory distress. He has no wheezes. He has no rales.          Assessment & Plan:  #1 postnasal drainage. Suspect allergic. Trial of Astelin nasal 2 sprays per nostril twice daily. Be in touch of nausea not resolving with treatment of postnasal drip #2 chronic left flank pain. Previous ultrasound unremarkable. Recheck urinalysis today-trace hematuria.  Send urine micro.

## 2014-11-12 NOTE — Progress Notes (Signed)
Pre visit review using our clinic review tool, if applicable. No additional management support is needed unless otherwise documented below in the visit note. 

## 2014-12-10 ENCOUNTER — Encounter: Payer: Self-pay | Admitting: Family Medicine

## 2014-12-10 ENCOUNTER — Ambulatory Visit (INDEPENDENT_AMBULATORY_CARE_PROVIDER_SITE_OTHER): Payer: BC Managed Care – PPO | Admitting: Family Medicine

## 2014-12-10 VITALS — BP 130/80 | HR 64 | Temp 97.9°F | Wt 239.0 lb

## 2014-12-10 DIAGNOSIS — R21 Rash and other nonspecific skin eruption: Secondary | ICD-10-CM

## 2014-12-10 DIAGNOSIS — R221 Localized swelling, mass and lump, neck: Secondary | ICD-10-CM

## 2014-12-10 MED ORDER — MOMETASONE FUROATE 0.1 % EX SOLN: Freq: Every day | CUTANEOUS | Status: DC

## 2014-12-10 NOTE — Progress Notes (Addendum)
   Subjective:    Patient ID: Brad Terry, male    DOB: 10/04/1956, 59 y.o.   MRN: [id number]  HPI Patient seen for the following issues  He's had a couple of pruritic scaly areas on his scalp. No obvious hair loss. No history of tinea capitis. He's tried some type of over-the-counter moisturizer without much improvement.  Present for several months.  Minimal pruritis. Small cystic type mass left neck just under the chin region. He first noticed around November or December. He's not aware of any skin changes. Nontender. No drainage. No generalized adenopathy. No appetite or weight changes.  Past Medical History  Diagnosis Date  . 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 Renal cyst   . Headaches due to old head injury   . Internal hemorrhoids   . Hyperlipidemia   . Diverticulosis    Past Surgical History  Procedure Laterality Date  . Knee arthroscopy      right    reports that he has never smoked. He has never used smokeless tobacco. He reports that he does not drink alcohol or use illicit drugs. family history includes Diabetes in his brother and mother; Prostate cancer in his brother. There is no history of Colon cancer. Allergies  Allergen Reactions  . Naproxen   . Piroxicam     REACTION: tinnitus  . Statins     Hx of se of 2 statins       Review of Systems  Constitutional: Negative for fever, chills, appetite change, fatigue and unexpected weight change.  Respiratory: Negative for cough.   Gastrointestinal: Negative for nausea, vomiting and abdominal pain.  Musculoskeletal: Negative for neck pain.  Skin: Positive for rash.  Neurological: Negative for weakness.  Hematological: Negative for adenopathy.       Objective:   Physical Exam  Constitutional: He appears well-developed and well-nourished.  Neck: Neck supple. No  thyromegaly present.  Approximately 1 cm mobile nontender mass underneath the left side of the chin left of midline. No overlying skin changes. No neck adenopathy noted otherwise. No thyroid mass  Cardiovascular: Normal rate and regular rhythm.   Pulmonary/Chest: Effort normal and breath sounds normal. No respiratory distress. He has no wheezes. He has no rales.  Lymphadenopathy:    He has no cervical adenopathy.  Skin:  He has a couple of small dry scaly patches in his scalp. We cannot appreciate any obvious hair loss in this region or broken hair shafts.          Assessment & Plan:  #1 neck mass. Question lymph node versus cyst. Set up neck ultrasound to further assess. No worrisome features #2 dry rash scalp. Doubt tinea capitis. No obvious broken hair shafts. Elocon motion twice daily.  If not resolving with that get scraping for KOH.   Ultrasound:  IMPRESSION: Indeterminate well-circumscribed approximately 1.4 cm solid nodule correlates with the patient's palpable area of concern. While indeterminate, given the central blood flow, an enlarged abnormal appearing submandibular lymph node could have a similar appearance. Further evaluation with contrast-enhanced neck CT could be performed as clinically indicated"  Will set up CT of neck to further evaluate.

## 2014-12-10 NOTE — Patient Instructions (Signed)
We will call you regarding neck ultrasound

## 2014-12-10 NOTE — Progress Notes (Signed)
Pre visit review using our clinic review tool, if applicable. No additional management support is needed unless otherwise documented below in the visit note. 

## 2014-12-22 ENCOUNTER — Ambulatory Visit
Admission: RE | Admit: 2014-12-22 | Discharge: 2014-12-22 | Disposition: A | Discharge status: Home / Self Care | Payer: BC Managed Care – PPO | Source: Ambulatory Visit | Attending: Family Medicine | Admitting: Family Medicine

## 2014-12-22 DIAGNOSIS — R221 Localized swelling, mass and lump, neck: Secondary | ICD-10-CM

## 2014-12-22 NOTE — Addendum Note (Signed)
Addended by: Eulas Post on: 12/22/2014 08:34 PM   Modules accepted: Orders

## 2014-12-23 ENCOUNTER — Telehealth: Payer: Self-pay | Admitting: Internal Medicine

## 2014-12-23 NOTE — Telephone Encounter (Signed)
Left message for patient to return call.

## 2014-12-23 NOTE — Telephone Encounter (Signed)
Pt requesting the results of his ultrasound

## 2014-12-28 ENCOUNTER — Ambulatory Visit (INDEPENDENT_AMBULATORY_CARE_PROVIDER_SITE_OTHER)
Admission: RE | Admit: 2014-12-28 | Discharge: 2014-12-28 | Disposition: A | Discharge status: Home / Self Care | Payer: BC Managed Care – PPO | Source: Ambulatory Visit | Attending: Family Medicine | Admitting: Family Medicine

## 2014-12-28 DIAGNOSIS — R221 Localized swelling, mass and lump, neck: Secondary | ICD-10-CM

## 2014-12-28 MED ORDER — IOHEXOL 300 MG/ML  SOLN
76.0000 mL | Freq: Once | INTRAMUSCULAR | Status: AC | PRN
  Administered 2014-12-28: 76 mL via INTRAVENOUS

## 2015-01-06 ENCOUNTER — Encounter: Payer: Self-pay | Admitting: Internal Medicine

## 2015-01-06 ENCOUNTER — Ambulatory Visit (INDEPENDENT_AMBULATORY_CARE_PROVIDER_SITE_OTHER): Payer: BC Managed Care – PPO | Admitting: Internal Medicine

## 2015-01-06 VITALS — BP 140/90 | Temp 97.6°F | Ht 68.0 in | Wt 240.0 lb

## 2015-01-06 DIAGNOSIS — K648 Other hemorrhoids: Secondary | ICD-10-CM

## 2015-01-06 DIAGNOSIS — L290 Pruritus ani: Secondary | ICD-10-CM

## 2015-01-06 DIAGNOSIS — R221 Localized swelling, mass and lump, neck: Secondary | ICD-10-CM

## 2015-01-06 DIAGNOSIS — R590 Localized enlarged lymph nodes: Secondary | ICD-10-CM

## 2015-01-06 MED ORDER — HYDROCORTISONE ACETATE 25 MG RE SUPP: 25.0000 mg | Freq: Every day | RECTAL | Status: DC

## 2015-01-06 MED ORDER — AMOXICILLIN-POT CLAVULANATE 875-125 MG PO TABS: 1.0000 | ORAL_TABLET | Freq: Two times a day (BID) | ORAL | Status: DC

## 2015-01-06 NOTE — Progress Notes (Signed)
Pre visit review using our clinic review tool, if applicable. No additional management support is needed unless otherwise documented below in the visit note.  Chief Complaint  Patient presents with  . Follow-up    lump in neck    HPI: Brad Terry comesin for fu of abnormal galnd in neck ntoed on exam and ct   Saw dr B who did evaluation comes in to discuss results  Onset noted  Since novemeber  ? Without change  Soon after Dental work  Right side  Tooth ensitive  And was a crack  And    Got  Filling.  No pian on left .   Did have  Sore in mouth left    No change in lump  Noted also timely after  Came back from [place]  November and after  Dental work .  ROS: See pertinent positives and negatives per HPI. No fever dysphagia  Fever weigh tloss unintended  Asks for refill hxc supp to use as needed  ( per dr Olevia Perches ) not using  regularly . Asks about anemia ( has seen heme in past)  No bleeding  Past Medical History  Diagnosis Date  . 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 Renal cyst   . Headaches due to old head injury   . Internal hemorrhoids   . Hyperlipidemia   . Diverticulosis     Family History  Problem Relation Age of Onset  . Diabetes Mother   . Diabetes Brother     x 2  . Colon cancer Neg Hx   . Prostate cancer Brother     x 2    History   Social History  . Marital Status: Married    Spouse Name: N/A  . Number of Children: N/A  . Years of Education: N/A   Social History Main Topics  . Smoking status: Never Smoker   . Smokeless tobacco: Never Used  . Alcohol Use: No  . Drug Use: No  . Sexual Activity: Not on file   Other Topics Concern  . 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   Tries to walk with exercise      [place] country of origin     Outpatient Encounter Prescriptions as of 01/06/2015  Medication Sig  . hydrocortisone (ANUSOL-HC) 25 MG suppository Place 1 suppository (25 mg total) rectally at bedtime.  . mometasone (ELOCON) 0.1 % lotion Apply topically daily.  . [DISCONTINUED] hydrocortisone (ANUSOL-HC) 25 MG suppository Place 1 suppository (25 mg total) rectally at bedtime.  Marland Kitchen amoxicillin-clavulanate (AUGMENTIN) 875-125 MG per tablet Take 1 tablet by mouth every 12 (twelve) hours.  Marland Kitchen azelastine (ASTELIN) 0.1 % nasal spray Place 2 sprays into both nostrils 2 (two) times daily. Use in each nostril as directed (Patient not taking: Reported on 01/06/2015)    EXAM:  BP 140/90 mmHg  Temp(Src) 97.6 F (36.4 C) (Oral)  Ht 5\' 8"  (1.727 m)  Wt 240 lb (108.863 kg)  BMI 36.50 kg/m2  Body mass index is 36.5 kg/(m^2).  GENERAL: vitals reviewed and listed above, alert, oriented, appears well hydrated and in no acute distress HEENT: atraumatic, conjunctiva  clear, no obvious abnormalities on inspection of external nose and ears OP : no lesion edema  or exudate  NECK:  Left sm round smooth mobile lump mon tender  No matting or other mass 1.5 cm about  PSYCH: pleasant and cooperative, no obvious depression or anxiety Reviewed [place] and ct scan results   ASSESSMENT AND PLAN:  Discussed the following assessment and plan:  Submandibular lymphadenopathy - sub mandibular single no sx ct cw inflammation  no change onset after dental work options discussed .antibiotic may not help but trial and then assess ENT opini  Neck mass  Internal hemorrhoids  Rectal itching - supp per dr brodies to use just as needed  can refill this  today  No systemic sx   But seemed to occur after dental work    Options discussed   At this time empiric antibiotic and fu and consider ent referral for eval .   rectal itching and hemorrhoids p[revious evaluated by dr Olevia Perches  Will refill today and use as needed and not reg basis.  Will check record ? About  the anemia he has seen hematology in past.  -Patient advised to return or notify health care team  if symptoms worsen ,persist or new concerns arise.  Patient Instructions  Antibiotic empiric    For now and follow up.   In 2-3 weeks   Decide whether  to see ENT specialist at that time.        [name]. Leitha Hyppolite M.D.

## 2015-01-06 NOTE — Patient Instructions (Signed)
Antibiotic empiric    For now and follow up.   In 2-3 weeks   Decide whether  to see ENT specialist at that time.

## 2015-02-10 ENCOUNTER — Encounter: Payer: BC Managed Care – PPO | Admitting: Internal Medicine

## 2015-02-10 NOTE — Progress Notes (Signed)
Document opened and reviewed for OV but appt  canceled same day .  

## 2015-02-15 ENCOUNTER — Encounter: Payer: Self-pay | Admitting: Internal Medicine

## 2015-02-15 ENCOUNTER — Ambulatory Visit: Payer: BC Managed Care – PPO | Admitting: Internal Medicine

## 2015-02-15 ENCOUNTER — Ambulatory Visit (INDEPENDENT_AMBULATORY_CARE_PROVIDER_SITE_OTHER): Payer: BC Managed Care – PPO | Admitting: Internal Medicine

## 2015-02-15 VITALS — BP 124/80 | Temp 98.5°F | Ht 68.0 in | Wt 240.7 lb

## 2015-02-15 DIAGNOSIS — D696 Thrombocytopenia, unspecified: Secondary | ICD-10-CM | POA: Diagnosis not present

## 2015-02-15 DIAGNOSIS — R221 Localized swelling, mass and lump, neck: Secondary | ICD-10-CM | POA: Diagnosis not present

## 2015-02-15 LAB — CBC WITH DIFFERENTIAL/PLATELET
BASOS PCT: 0.5 % (ref 0.0–3.0)
Basophils Absolute: 0 10*3/uL (ref 0.0–0.1)
EOS PCT: 3.5 % (ref 0.0–5.0)
Eosinophils Absolute: 0.2 10*3/uL (ref 0.0–0.7)
HCT: 38.6 % — ABNORMAL LOW (ref 39.0–52.0)
Hemoglobin: 12.9 g/dL — ABNORMAL LOW (ref 13.0–17.0)
LYMPHS PCT: 32.3 % (ref 12.0–46.0)
Lymphs Abs: 1.4 10*3/uL (ref 0.7–4.0)
MCHC: 33.4 g/dL (ref 30.0–36.0)
MCV: 85.2 fl (ref 78.0–100.0)
MONO ABS: 0.3 10*3/uL (ref 0.1–1.0)
Monocytes Relative: 6.9 % (ref 3.0–12.0)
Neutro Abs: 2.4 10*3/uL (ref 1.4–7.7)
Neutrophils Relative %: 56.8 % (ref 43.0–77.0)
Platelets: 101 10*3/uL — ABNORMAL LOW (ref 150.0–400.0)
RBC: 4.53 Mil/uL (ref 4.22–5.81)
RDW: 14.6 % (ref 11.5–15.5)
WBC: 4.3 10*3/uL (ref 4.0–10.5)

## 2015-02-15 NOTE — Patient Instructions (Signed)
Will notify you  of labs when available.   Will be contact .    About the ENT appt to check the lump.  Since it hasnt decreased in size .

## 2015-02-15 NOTE — Progress Notes (Signed)
Pre visit review using our clinic review tool, if applicable. No additional management support is needed unless otherwise documented below in the visit note.  Chief Complaint  Patient presents with  . Follow-up    neck lump    HPI: Brad Terry 59 y.o.  Here for fu neck lump didn't see any real change no pain no new lumps.  Has had a ? Cold resp irriation since travel to [place]. But no fever. No new dental sx  ROS: See pertinent positives and negatives per HPI.  Past Medical History  Diagnosis Date  . 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 Renal cyst   . Headaches due to old head injury   . Internal hemorrhoids   . Hyperlipidemia   . Diverticulosis    Low plt ? Felt to be itp per dr Brad Terry  Family History  Problem Relation Age of Onset  . Diabetes Mother   . Diabetes Brother     x 2  . Colon cancer Neg Hx   . Prostate cancer Brother     x 2    History   Social History  . Marital Status: Married    Spouse Name: N/A  . Number of Children: N/A  . Years of Education: N/A   Social History Main Topics  . Smoking status: Never Smoker   . Smokeless tobacco: Never Used  . Alcohol Use: No  . Drug Use: No  . Sexual Activity: Not on file   Other Topics Concern  . 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   Tries to walk with exercise      [place] country of origin    Outpatient Encounter Prescriptions as of 02/15/2015  Medication Sig  . hydrocortisone (ANUSOL-HC) 25 MG suppository Place 1 suppository (25 mg total) rectally at bedtime.  . mometasone (ELOCON) 0.1 % lotion Apply topically daily.  Marland Kitchen azelastine (ASTELIN) 0.1 % nasal spray Place 2 sprays into both nostrils 2 (two) times daily. Use in each nostril as directed (Patient not taking: Reported on  01/06/2015)  . [DISCONTINUED] amoxicillin-clavulanate (AUGMENTIN) 875-125 MG per tablet Take 1 tablet by mouth every 12 (twelve) hours.    EXAM:  BP 124/80 mmHg  Temp(Src) 98.5 F (36.9 C) (Oral)  Ht 5\' 8"  (1.727 m)  Wt 240 lb 11.2 oz (109.181 kg)  BMI 36.61 kg/m2  Body mass index is 36.61 kg/(m^2).  GENERAL: vitals reviewed and listed above, alert, oriented, appears well hydrated and in no acute distress HEENT: atraumatic, conjunctiva  clear, no obvious abnormalities on inspection of external nose and ears OP : no lesion edema or exudate  Noted  NECK:  Smooth firm  mobile SM lump non tender no adenopathy  LUNGS: clear to auscultation bilaterally, no wheezes, rales or rhonchi,  CV: HRRR, no clubbing cyanosis or  peripheral edema nl cap refill  MS: moves all extremities without noticeable focal  abnormality PSYCH: pleasant and cooperative, no obvious depression or anxiety   ASSESSMENT AND PLAN:  Discussed the following assessment and plan:  Neck mass - Plan: CBC with Differential/Platelet, Ambulatory referral to ENT  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  -Patient advised to return or notify health care team  if symptoms worsen ,persist or new concerns arise.  Patient Instructions  Will notify you  of labs when available.   Will be contact .    About the ENT appt to check the lump.  Since it hasnt decreased in size .     Brad Terry. Brad Terry M.D.

## 2015-02-23 ENCOUNTER — Telehealth: Payer: Self-pay | Admitting: Internal Medicine

## 2015-02-23 NOTE — Telephone Encounter (Signed)
Pt called to ask if he can have a rx for the following antibiotic  CLINDAMYCIN . Pt said the AMOXICILLIN  Did not work for swollen lymph node in the [place]

## 2015-02-23 NOTE — Telephone Encounter (Signed)
Spoke to the pt.  He informed me that he is scheduled for a bx on 02/24/15.  Wants the clindamycin prescription in hopes it will treat his swollen lymph node and he can postpone or completely cancel the bx.  Informed WP who thought that the pt should contact ENT office since they have taken over care.  Pt notified.

## 2015-03-24 ENCOUNTER — Other Ambulatory Visit: Payer: Self-pay | Admitting: Otolaryngology

## 2015-06-08 ENCOUNTER — Encounter: Payer: Self-pay | Admitting: Internal Medicine

## 2015-08-17 ENCOUNTER — Telehealth: Payer: Self-pay | Admitting: Family Medicine

## 2015-08-17 ENCOUNTER — Other Ambulatory Visit: Payer: Self-pay | Admitting: Family Medicine

## 2015-08-17 DIAGNOSIS — Z125 Encounter for screening for malignant neoplasm of prostate: Secondary | ICD-10-CM

## 2015-08-17 DIAGNOSIS — Z131 Encounter for screening for diabetes mellitus: Secondary | ICD-10-CM

## 2015-08-17 DIAGNOSIS — Z833 Family history of diabetes mellitus: Secondary | ICD-10-CM

## 2015-08-17 DIAGNOSIS — Z0000 Encounter for general adult medical examination without abnormal findings: Secondary | ICD-10-CM

## 2015-08-17 DIAGNOSIS — Z8042 Family history of malignant neoplasm of prostate: Secondary | ICD-10-CM

## 2015-08-17 NOTE — Telephone Encounter (Signed)
Pt needs CPX and lab work in Dec or Jan per [facility].  Please schedule.  Do something special if needed.  I have placed the lab orders.  Thanks!

## 2015-08-17 NOTE — Telephone Encounter (Signed)
Pt has been sch

## 2015-10-06 ENCOUNTER — Other Ambulatory Visit (INDEPENDENT_AMBULATORY_CARE_PROVIDER_SITE_OTHER): Payer: BC Managed Care – PPO

## 2015-10-06 DIAGNOSIS — Z131 Encounter for screening for diabetes mellitus: Secondary | ICD-10-CM | POA: Diagnosis not present

## 2015-10-06 DIAGNOSIS — Z125 Encounter for screening for malignant neoplasm of prostate: Secondary | ICD-10-CM | POA: Diagnosis not present

## 2015-10-06 DIAGNOSIS — Z8042 Family history of malignant neoplasm of prostate: Secondary | ICD-10-CM

## 2015-10-06 DIAGNOSIS — Z0000 Encounter for general adult medical examination without abnormal findings: Secondary | ICD-10-CM | POA: Diagnosis not present

## 2015-10-06 DIAGNOSIS — Z833 Family history of diabetes mellitus: Secondary | ICD-10-CM

## 2015-10-06 LAB — LIPID PANEL
CHOLESTEROL: 229 mg/dL — AB (ref 0–200)
HDL: 42.2 mg/dL (ref >39.00)
LDL CALC: 169 mg/dL — AB (ref 0–99)
NONHDL: 186.55
Total CHOL/HDL Ratio: 5
Triglycerides: 88 mg/dL (ref 0.0–149.0)
VLDL: 17.6 mg/dL (ref 0.0–40.0)

## 2015-10-06 LAB — CBC WITH DIFFERENTIAL/PLATELET
BASOS ABS: 0 10*3/uL (ref 0.0–0.1)
BASOS PCT: 0.5 % (ref 0.0–3.0)
Eosinophils Absolute: 0.2 10*3/uL (ref 0.0–0.7)
Eosinophils Relative: 4.1 % (ref 0.0–5.0)
HCT: 40.3 % (ref 39.0–52.0)
Hemoglobin: 13.4 g/dL (ref 13.0–17.0)
LYMPHS ABS: 1.6 10*3/uL (ref 0.7–4.0)
Lymphocytes Relative: 29.7 % (ref 12.0–46.0)
MCHC: 33.3 g/dL (ref 30.0–36.0)
MCV: 87.3 fl (ref 78.0–100.0)
MONOS PCT: 8 % (ref 3.0–12.0)
Monocytes Absolute: 0.4 10*3/uL (ref 0.1–1.0)
Neutro Abs: 3.2 10*3/uL (ref 1.4–7.7)
Neutrophils Relative %: 57.7 % (ref 43.0–77.0)
PLATELETS: 107 10*3/uL — AB (ref 150.0–400.0)
RBC: 4.62 Mil/uL (ref 4.22–5.81)
RDW: 13.8 % (ref 11.5–15.5)
WBC: 5.5 10*3/uL (ref 4.0–10.5)

## 2015-10-06 LAB — HEPATIC FUNCTION PANEL
ALT: 27 U/L (ref 0–53)
AST: 20 U/L (ref 0–37)
Albumin: 4.3 g/dL (ref 3.5–5.2)
Alkaline Phosphatase: 57 U/L (ref 39–117)
BILIRUBIN DIRECT: 0.1 mg/dL (ref 0.0–0.3)
Total Bilirubin: 0.4 mg/dL (ref 0.2–1.2)
Total Protein: 6.9 g/dL (ref 6.0–8.3)

## 2015-10-06 LAB — BASIC METABOLIC PANEL
BUN: 15 mg/dL (ref 6–23)
CALCIUM: 9.4 mg/dL (ref 8.4–10.5)
CO2: 29 meq/L (ref 19–32)
Chloride: 105 mEq/L (ref 96–112)
Creatinine, Ser: 1.14 mg/dL (ref 0.40–1.50)
GFR: 84.34 mL/min (ref >60.00)
Glucose, Bld: 111 mg/dL — ABNORMAL HIGH (ref 70–99)
Potassium: 4.2 mEq/L (ref 3.5–5.1)
SODIUM: 141 meq/L (ref 135–145)

## 2015-10-06 LAB — TSH: TSH: 5.63 u[IU]/mL — ABNORMAL HIGH (ref 0.35–4.50)

## 2015-10-06 LAB — PSA: PSA: 0.66 ng/mL (ref 0.10–4.00)

## 2015-10-06 LAB — HEMOGLOBIN A1C: Hgb A1c MFr Bld: 6.8 % — ABNORMAL HIGH (ref 4.6–6.5)

## 2015-10-10 ENCOUNTER — Encounter: Payer: Self-pay | Admitting: Internal Medicine

## 2015-10-10 ENCOUNTER — Ambulatory Visit (INDEPENDENT_AMBULATORY_CARE_PROVIDER_SITE_OTHER): Payer: BC Managed Care – PPO | Admitting: Internal Medicine

## 2015-10-10 VITALS — BP 120/80 | Temp 98.5°F | Ht 68.0 in | Wt 237.7 lb

## 2015-10-10 DIAGNOSIS — R946 Abnormal results of thyroid function studies: Secondary | ICD-10-CM

## 2015-10-10 DIAGNOSIS — G4730 Sleep apnea, unspecified: Secondary | ICD-10-CM

## 2015-10-10 DIAGNOSIS — R739 Hyperglycemia, unspecified: Secondary | ICD-10-CM

## 2015-10-10 DIAGNOSIS — D696 Thrombocytopenia, unspecified: Secondary | ICD-10-CM

## 2015-10-10 DIAGNOSIS — Z0000 Encounter for general adult medical examination without abnormal findings: Secondary | ICD-10-CM | POA: Diagnosis not present

## 2015-10-10 DIAGNOSIS — R7989 Other specified abnormal findings of blood chemistry: Secondary | ICD-10-CM

## 2015-10-10 DIAGNOSIS — R0989 Other specified symptoms and signs involving the circulatory and respiratory systems: Secondary | ICD-10-CM

## 2015-10-10 DIAGNOSIS — G478 Other sleep disorders: Secondary | ICD-10-CM | POA: Diagnosis not present

## 2015-10-10 MED ORDER — METFORMIN HCL ER 500 MG PO TB24
500.0000 mg | ORAL_TABLET | Freq: Every day | ORAL | Status: DC
Start: 2015-10-10 — End: 2016-06-28

## 2015-10-10 NOTE — Patient Instructions (Addendum)
Plan repeat  Thyroid tests  Wt Readings from Last 3 Encounters:  10/10/15 237 lb 11.2 oz (107.82 kg)  02/15/15 240 lb 11.2 oz (109.181 kg)  01/06/15 240 lb (108.863 kg)    Intensify lifestyle interventions. To avoid full blown diabetes . And help cholesterol readings .  No sugar in beverages  No processed carbs , sweets and breads.  Brown in stead of white rice.  Mediterranean diet is a heart healty diet and avoids getting diabetes .  Consider adding metformin to tmedication  To help with blood sugars .   Will refer to sleep  Pulmonary about the chest and sleep sx.  Your exam is good today        Why follow it? Research shows. . Those who follow the Mediterranean diet have a reduced risk of heart disease  . The diet is associated with a reduced incidence of Parkinson's and Alzheimer's diseases . People following the diet may have longer life expectancies and lower rates of chronic diseases  . The Dietary Guidelines for Americans recommends the Mediterranean diet as an eating plan to promote health and prevent disease  What Is the Mediterranean Diet?  . Healthy eating plan based on typical foods and recipes of Mediterranean-style cooking . The diet is primarily a plant based diet; these foods should make up a majority of meals   Starches - Plant based foods should make up a majority of meals - They are an important sources of vitamins, minerals, energy, antioxidants, and fiber - Choose whole grains, foods high in fiber and minimally processed items  - Typical grain sources include wheat, oats, barley, corn, brown rice, bulgar, farro, millet, polenta, couscous  - Various types of beans include chickpeas, lentils, fava beans, black beans, white beans   Fruits  Veggies - Large quantities of antioxidant rich fruits & veggies; 6 or more servings  - Vegetables can be eaten raw or lightly drizzled with oil and cooked  - Vegetables common to the traditional Mediterranean Diet include:  artichokes, arugula, beets, broccoli, brussel sprouts, cabbage, carrots, celery, collard greens, cucumbers, eggplant, kale, leeks, lemons, lettuce, mushrooms, okra, onions, peas, peppers, potatoes, pumpkin, radishes, rutabaga, shallots, spinach, sweet potatoes, turnips, zucchini - Fruits common to the Mediterranean Diet include: apples, apricots, avocados, cherries, clementines, dates, figs, grapefruits, grapes, melons, nectarines, oranges, peaches, pears, pomegranates, strawberries, tangerines  Fats - Replace butter and margarine with healthy oils, such as olive oil, canola oil, and tahini  - Limit nuts to no more than a handful a day  - Nuts include walnuts, almonds, pecans, pistachios, pine nuts  - Limit or avoid candied, honey roasted or heavily salted nuts - Olives are central to the [employer] - can be eaten whole or used in a variety of dishes   Meats Protein - Limiting red meat: no more than a few times a month - When eating red meat: choose lean cuts and keep the portion to the size of deck of cards - Eggs: approx. 0 to 4 times a week  - Fish and lean poultry: at least 2 a week  - Healthy protein sources include, chicken, [place], lean beef, lamb - Increase intake of seafood such as tuna, salmon, trout, mackerel, shrimp, scallops - Avoid or limit high fat processed meats such as sausage and bacon  Dairy - Include moderate amounts of low fat dairy products  - Focus on healthy dairy such as fat free yogurt, skim milk, low or reduced fat cheese - Limit dairy  products higher in fat such as whole or 2% milk, cheese, ice cream  Alcohol - Moderate amounts of red wine is ok  - No more than 5 oz daily for women (all ages) and men older than age 94  - No more than 10 oz of wine daily for men younger than 43  Other - Limit sweets and other desserts  - Use herbs and spices instead of salt to flavor foods  - Herbs and spices common to the traditional Mediterranean Diet include: basil, bay  leaves, chives, cloves, cumin, fennel, garlic, lavender, marjoram, mint, oregano, parsley, pepper, rosemary, sage, savory, sumac, tarragon, thyme   It's not just a diet, it's a lifestyle:  . The Mediterranean diet includes lifestyle factors typical of those in the region  . Foods, drinks and meals are best eaten with others and savored . Daily physical activity is important for overall good health . This could be strenuous exercise like running and aerobics . This could also be more leisurely activities such as walking, housework, yard-work, or taking the stairs . Moderation is the key; a balanced and healthy diet accommodates most foods and drinks . Consider portion sizes and frequency of consumption of certain foods   Meal Ideas & Options:  . Breakfast:  o Whole wheat toast or whole wheat English muffins with peanut butter & hard boiled egg o Steel cut oats topped with apples & cinnamon and skim milk  o Fresh fruit: banana, strawberries, melon, berries, peaches  o Smoothies: strawberries, bananas, greek yogurt, peanut butter o Low fat greek yogurt with blueberries and granola  o Egg white omelet with spinach and mushrooms o Breakfast couscous: whole wheat couscous, apricots, skim milk, cranberries  . Sandwiches:  o Hummus and grilled vegetables (peppers, zucchini, squash) on whole wheat bread   o Grilled chicken on whole wheat pita with lettuce, tomatoes, cucumbers or tzatziki  o Tuna salad on whole wheat bread: tuna salad made with greek yogurt, olives, red peppers, capers, green onions o Garlic rosemary lamb pita: lamb sauted with garlic, rosemary, salt & pepper; add lettuce, cucumber, greek yogurt to pita - flavor with lemon juice and black pepper  . Seafood:  o Mediterranean grilled salmon, seasoned with garlic, basil, parsley, lemon juice and black pepper o Shrimp, lemon, and spinach whole-grain pasta salad made with low fat greek yogurt  o Seared scallops with lemon orzo   o Seared tuna steaks seasoned salt, pepper, coriander topped with tomato mixture of olives, tomatoes, olive oil, minced garlic, parsley, green onions and cappers  . Meats:  o Herbed greek chicken salad with kalamata olives, cucumber, feta  o Red bell peppers stuffed with spinach, bulgur, lean ground beef (or lentils) & topped with feta   o Kebabs: skewers of chicken, tomatoes, onions, zucchini, squash  o [place] burgers: made with red onions, mint, dill, lemon juice, feta cheese topped with roasted red peppers . Vegetarian o Cucumber salad: cucumbers, artichoke hearts, celery, red onion, feta cheese, tossed in olive oil & lemon juice  o Hummus and whole grain pita points with a greek salad (lettuce, tomato, feta, olives, cucumbers, red onion) o Lentil soup with celery, carrots made with vegetable broth, garlic, salt and pepper  o Tabouli salad: parsley, bulgur, mint, scallions, cucumbers, tomato, radishes, lemon juice, olive oil, salt and pepper.

## 2015-10-10 NOTE — Progress Notes (Signed)
Pre visit review using our clinic review tool, if applicable. No additional management support is needed unless otherwise documented below in the visit note.  Chief Complaint  Patient presents with  . Annual Exam    HPI: Patient  Brad Terry  59 y.o. comes in today for Preventive Health Care visit  alos chest and haed congsetion for  A monthe without fever chi;lls sob wheeze  ? If exposed to  Outside allergens. shooulder pains  Has been doing heavy lifting  Speakers at times   Health Maintenance  Topic Date Due  . Hepatitis C Screening  [birth year]  . HIV Screening  01/09/1971  . INFLUENZA VACCINE  06/06/2015  . TETANUS/TDAP  01/10/2016  . COLONOSCOPY  12/28/2019   Health Maintenance Review LIFESTYLE:  Exercise:  Active  Tobacco/ETS:n Alcohol: per day n Sugar beverages:chocolat wsuagr Sleep: some snoring  Wife says ? Some stopping at times 6-7Drug use: no Colonoscopy:   ROS:  GEN/ HEENT: No fever, significant weight changes sweats headaches vision problems hearing changes, CV/ PULM; No chest pain shortness of breath cough, syncope,edema  change in exercise tolerance. GI /GU: No adominal pain, vomiting, change in bowel habits. No blood in the stool wouldhas  Hx of hemrrhoids and preps used  SKIN/HEME: ,no acute skin rashes suspicious lesions or bleeding. No lymphadenopathy, nodules, masses.  NEURO/ PSYCH:  No neurologic signs such as weakness numbness. No depression anxiety. IMM/ Allergy: No unusual infections.  Allergy .   REST of 12 system review negative except as per HPI   Past Medical History  Diagnosis Date  . 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 Renal cyst   . Headaches due to old head injury   . Internal hemorrhoids   . Hyperlipidemia   . Diverticulosis     Past Surgical History  Procedure Laterality Date  .  Knee arthroscopy      right    Family History  Problem Relation Age of Onset  . Diabetes Mother   . Diabetes Brother     x 2  . Colon cancer Neg Hx   . Prostate cancer Brother     x 2    Social History   Social History  . Marital Status: Married    Spouse Name: N/A  . Number of Children: N/A  . Years of Education: N/A   Social History Main Topics  . Smoking status: Never Smoker   . Smokeless tobacco: Never Used  . Alcohol Use: No  . Drug Use: No  . Sexual Activity: Not Asked   Other Topics Concern  . 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   Tries to walk with exercise      [place]    Outpatient Prescriptions Prior to Visit  Medication Sig Dispense Refill  . hydrocortisone (ANUSOL-HC) 25 MG suppository Place 1 suppository (25 mg total) rectally at bedtime. 12 suppository 2  . mometasone (ELOCON) 0.1 % lotion Apply topically daily. 60 mL 1  . azelastine (ASTELIN) 0.1 % nasal spray Place 2 sprays into both nostrils 2 (two) times daily. Use in each nostril as directed (Patient not taking: Reported on 01/06/2015) 30 mL 12   No facility-administered medications prior to visit.  EXAM:  BP 120/80 mmHg  Temp(Src) 98.5 F (36.9 C) (Oral)  Ht 5\' 8"  (1.727 m)  Wt 237 lb 11.2 oz (107.82 kg)  BMI 36.15 kg/m2  Body mass index is 36.15 kg/(m^2).  Physical Exam: Vital signs reviewed [id number] is a well-developed well-nourished alert cooperative    who appearsr stated age in no acute distress.  HEENT: normocephalic atraumatic , Eyes: PERRL EOM's full, conjunctiva clear, Nares: paten,t no deformity discharge or tenderness., Ears: no deformity EAC's clear TMs with normal landmarks. Mouth: clear OP, no lesions, edema.  Moist mucous membranes. Dentition in adequate repair. NECK: supple without masses, thyromegaly or bruits. CHEST/PULM:  Clear to auscultation and percussion breath sounds equal  no wheeze , rales or rhonchi. No chest wall deformities or tenderness. CV: PMI is nondisplaced, S1 S2 no gallops, murmurs, rubs. Peripheral pulses are full without delay.No JVD .  ABDOMEN: Bowel sounds normal nontender  No guard or rebound, no hepato splenomegal no CVA tenderness.  No hernia. Extremtities:  No clubbing cyanosis or edema, no acute joint swelling or redness no focal atrophy NEURO:  Oriented x3, cranial nerves 3-12 appear to be intact, no obvious focal weakness,gait within normal limits no abnormal reflexes or asymmetrical  Rectal prostate 1+ no nodules  SKIN: No acute rashes normal turgor, color, no bruising or petechiae. PSYCH: Oriented, good eye contact, no obvious depression anxiety, cognition and judgment appear normal. LN: no cervical axillary inguinal adenopathy  Lab Results  Component Value Date   WBC 5.5 10/06/2015   HGB 13.4 10/06/2015   HCT 40.3 10/06/2015   PLT 107.0* 10/06/2015   GLUCOSE 111* 10/06/2015   CHOL 229* 10/06/2015   TRIG 88.0 10/06/2015   HDL 42.20 10/06/2015   LDLDIRECT 140.2 07/31/2012   LDLCALC 169* 10/06/2015   ALT 27 10/06/2015   AST 20 10/06/2015   NA 141 10/06/2015   K 4.2 10/06/2015   CL 105 10/06/2015   CREATININE 1.14 10/06/2015   BUN 15 10/06/2015   CO2 29 10/06/2015   TSH 2.56 10/10/2015   PSA 0.66 10/06/2015   HGBA1C 6.8* 10/06/2015    ASSESSMENT AND PLAN:  Discussed the following assessment and plan:  Visit for preventive health examination  Elevated TSH - net  check tsh free t4 AND  antibodies  - Plan: TSH, T4, free, Thyroid antibodies  Hyperglycemia - early diabetes vx pre diabeteswill consider taking metformin and fu 3 months with hga 1c   Thrombocytopenia (HCC)  Sleep disorder breathing - wife notes  some apnea breathing "chest congestion "at night having to sit up ? osa vs reflux vs other  - Plan: Ambulatory referral to Pulmonology  Chest congestion - Plan: DG Chest 2 View, Ambulatory referral to  Pulmonology hiv hep c  Patient Care Team: Burnis Medin, MD as PCP - General Lafayette Dragon, MD (Gastroenterology) Curt Bears, MD (Hematology and Oncology) Patient Instructions   Plan repeat  Thyroid tests  Wt Readings from Last 3 Encounters:  10/10/15 237 lb 11.2 oz (107.82 kg)  02/15/15 240 lb 11.2 oz (109.181 kg)  01/06/15 240 lb (108.863 kg)    Intensify lifestyle interventions. To avoid full blown diabetes . And help cholesterol readings .  No sugar in beverages  No processed carbs , sweets and breads.  Brown in stead of white rice.  Mediterranean diet is a heart healty diet and avoids getting diabetes .  Consider adding metformin to tmedication  To help with blood sugars .   Will refer to sleep  Pulmonary about the chest and sleep sx.  Your exam is good today        Why follow it? Research shows. . Those who follow the Mediterranean diet have a reduced risk of heart disease  . The diet is associated with a reduced incidence of Parkinson's and Alzheimer's diseases . People following the diet may have longer life expectancies and lower rates of chronic diseases  . The Dietary Guidelines for Americans recommends the Mediterranean diet as an eating plan to promote health and prevent disease  What Is the Mediterranean Diet?  . Healthy eating plan based on typical foods and recipes of Mediterranean-style cooking . The diet is primarily a plant based diet; these foods should make up a majority of meals   Starches - Plant based foods should make up a majority of meals - They are an important sources of vitamins, minerals, energy, antioxidants, and fiber - Choose whole grains, foods high in fiber and minimally processed items  - Typical grain sources include wheat, oats, barley, corn, brown rice, bulgar, farro, millet, polenta, couscous  - Various types of beans include chickpeas, lentils, fava beans, black beans, white beans   Fruits  Veggies - Large quantities of  antioxidant rich fruits & veggies; 6 or more servings  - Vegetables can be eaten raw or lightly drizzled with oil and cooked  - Vegetables common to the traditional Mediterranean Diet include: artichokes, arugula, beets, broccoli, brussel sprouts, cabbage, carrots, celery, collard greens, cucumbers, eggplant, kale, leeks, lemons, lettuce, mushrooms, okra, onions, peas, peppers, potatoes, pumpkin, radishes, rutabaga, shallots, spinach, sweet potatoes, turnips, zucchini - Fruits common to the Mediterranean Diet include: apples, apricots, avocados, cherries, clementines, dates, figs, grapefruits, grapes, melons, nectarines, oranges, peaches, pears, pomegranates, strawberries, tangerines  Fats - Replace butter and margarine with healthy oils, such as olive oil, canola oil, and tahini  - Limit nuts to no more than a handful a day  - Nuts include walnuts, almonds, pecans, pistachios, pine nuts  - Limit or avoid candied, honey roasted or heavily salted nuts - Olives are central to the [employer] - can be eaten whole or used in a variety of dishes   Meats Protein - Limiting red meat: no more than a few times a month - When eating red meat: choose lean cuts and keep the portion to the size of deck of cards - Eggs: approx. 0 to 4 times a week  - Fish and lean poultry: at least 2 a week  - Healthy protein sources include, chicken, [place], lean beef, lamb - Increase intake of seafood such as tuna, salmon, trout, mackerel, shrimp, scallops - Avoid or limit high fat processed meats such as sausage and bacon  Dairy - Include moderate amounts of low fat dairy products  - Focus on healthy dairy such as fat free yogurt, skim milk, low or reduced fat cheese - Limit dairy products higher in fat such as whole or 2% milk, cheese, ice cream  Alcohol - Moderate amounts of red wine is ok  - No more than 5 oz daily for women (all ages) and men older than age 7  - No more than 10 oz of wine daily for men younger  than 40  Other - Limit sweets and other desserts  - Use herbs and spices instead of salt to flavor foods  - Herbs and spices common to the traditional Mediterranean Diet include: basil, bay leaves, chives, cloves, cumin, fennel, garlic, lavender, marjoram, mint, oregano, parsley, pepper, rosemary,  sage, savory, sumac, tarragon, thyme   It's not just a diet, it's a lifestyle:  . The Mediterranean diet includes lifestyle factors typical of those in the region  . Foods, drinks and meals are best eaten with others and savored . Daily physical activity is important for overall good health . This could be strenuous exercise like running and aerobics . This could also be more leisurely activities such as walking, housework, yard-work, or taking the stairs . Moderation is the key; a balanced and healthy diet accommodates most foods and drinks . Consider portion sizes and frequency of consumption of certain foods   Meal Ideas & Options:  . Breakfast:  o Whole wheat toast or whole wheat English muffins with peanut butter & hard boiled egg o Steel cut oats topped with apples & cinnamon and skim milk  o Fresh fruit: banana, strawberries, melon, berries, peaches  o Smoothies: strawberries, bananas, greek yogurt, peanut butter o Low fat greek yogurt with blueberries and granola  o Egg white omelet with spinach and mushrooms o Breakfast couscous: whole wheat couscous, apricots, skim milk, cranberries  . Sandwiches:  o Hummus and grilled vegetables (peppers, zucchini, squash) on whole wheat bread   o Grilled chicken on whole wheat pita with lettuce, tomatoes, cucumbers or tzatziki  o Tuna salad on whole wheat bread: tuna salad made with greek yogurt, olives, red peppers, capers, green onions o Garlic rosemary lamb pita: lamb sauted with garlic, rosemary, salt & pepper; add lettuce, cucumber, greek yogurt to pita - flavor with lemon juice and black pepper  . Seafood:  o Mediterranean grilled salmon,  seasoned with garlic, basil, parsley, lemon juice and black pepper o Shrimp, lemon, and spinach whole-grain pasta salad made with low fat greek yogurt  o Seared scallops with lemon orzo  o Seared tuna steaks seasoned salt, pepper, coriander topped with tomato mixture of olives, tomatoes, olive oil, minced garlic, parsley, green onions and cappers  . Meats:  o Herbed greek chicken salad with kalamata olives, cucumber, feta  o Red bell peppers stuffed with spinach, bulgur, lean ground beef (or lentils) & topped with feta   o Kebabs: skewers of chicken, tomatoes, onions, zucchini, squash  o [place] burgers: made with red onions, mint, dill, lemon juice, feta cheese topped with roasted red peppers . Vegetarian o Cucumber salad: cucumbers, artichoke hearts, celery, red onion, feta cheese, tossed in olive oil & lemon juice  o Hummus and whole grain pita points with a greek salad (lettuce, tomato, feta, olives, cucumbers, red onion) o Lentil soup with celery, carrots made with vegetable broth, garlic, salt and pepper  o Tabouli salad: parsley, bulgur, mint, scallions, cucumbers, tomato, radishes, lemon juice, olive oil, salt and pepper.           Brad Brooking. Terry M.D.

## 2015-10-11 LAB — THYROID ANTIBODIES
THYROID PEROXIDASE ANTIBODY: 1 [IU]/mL (ref <9)
Thyroglobulin Ab: <1 IU/mL (ref <2)

## 2015-10-11 LAB — T4, FREE: FREE T4: 0.76 ng/dL (ref 0.60–1.60)

## 2015-10-11 LAB — TSH: TSH: 2.56 u[IU]/mL (ref 0.35–4.50)

## 2015-11-02 ENCOUNTER — Telehealth: Payer: Self-pay | Admitting: Internal Medicine

## 2015-11-02 NOTE — Telephone Encounter (Signed)
Brad Terry DOB: 04-Aug-1956 Initial Comment Caller states he has sinus and chest congestion, nausea Nurse Assessment Nurse: Vallery Sa, RN, Cathy Date/Time (Eastern Time): 11/02/2015 2:03:36 PM Confirm and document reason for call. If symptomatic, describe symptoms. ---Caller states he developed cough/cold symptoms about 3 weeks ago. He thinks he developed low grade fever about 3-4 days ago. No severe breathing difficulty. He developed nausea about 3 weeks ago. Has the patient traveled out of the country within the last 30 days? ---No Does the patient have any new or worsening symptoms? ---Yes Will a triage be completed? ---Yes Related visit to physician within the last 2 weeks? ---No Does the PT have any chronic conditions? (i.e. diabetes, asthma, etc.) ---Yes List chronic conditions. ---Shoulder pain, High Cholesterol, Pre-Diabetes? Is this a behavioral health or substance abuse call? ---No Guidelines Guideline Title Affirmed Question Affirmed Notes Cough - Acute Productive Chest pain (Exception: MILD central chest pain, present only when coughing) Final Disposition User Go to ED Now Vallery Sa, RN, [place] - ED Disagree/Comply: Comply

## 2015-11-03 ENCOUNTER — Ambulatory Visit (INDEPENDENT_AMBULATORY_CARE_PROVIDER_SITE_OTHER)
Admission: RE | Admit: 2015-11-03 | Discharge: 2015-11-03 | Disposition: A | Discharge status: Home / Self Care | Payer: BC Managed Care – PPO | Source: Ambulatory Visit | Attending: Internal Medicine | Admitting: Internal Medicine

## 2015-11-03 DIAGNOSIS — R0989 Other specified symptoms and signs involving the circulatory and respiratory systems: Secondary | ICD-10-CM | POA: Diagnosis not present

## 2015-11-03 NOTE — Telephone Encounter (Signed)
Called and spoke with pt and pt states he did not go to the ED and would like to be seen in the office. Advised pt that Dr. Regis Bill was full today and one of her colleagues could see pt tomorrow 12.30.2016 at 1 pm.  Pt is aware of appt and verbalized understanding.

## 2015-11-03 NOTE — Telephone Encounter (Signed)
Left a message for return call.  

## 2015-11-04 ENCOUNTER — Encounter: Payer: Self-pay | Admitting: Family Medicine

## 2015-11-04 ENCOUNTER — Ambulatory Visit (INDEPENDENT_AMBULATORY_CARE_PROVIDER_SITE_OTHER): Payer: BC Managed Care – PPO | Admitting: Family Medicine

## 2015-11-04 VITALS — BP 117/66 | HR 72 | Temp 98.4°F | Ht 68.0 in | Wt 230.0 lb

## 2015-11-04 DIAGNOSIS — J209 Acute bronchitis, unspecified: Secondary | ICD-10-CM

## 2015-11-04 MED ORDER — AZITHROMYCIN 250 MG PO TABS: ORAL_TABLET | ORAL | Status: DC

## 2015-11-04 NOTE — Progress Notes (Signed)
Pre visit review using our clinic review tool, if applicable. No additional management support is needed unless otherwise documented below in the visit note. 

## 2015-11-04 NOTE — Progress Notes (Signed)
   Subjective:    Patient ID: Brad Terry, male    DOB: Aug 15, 1956, 59 y.o.   MRN: [id number]  HPI Here for 4 weeks of chest congestion and coughing up yellow sputum. No fever. He had a CXR yesterday which was clear.    Review of Systems  Constitutional: Negative.   HENT: Positive for congestion and postnasal drip.   Eyes: Negative.   Respiratory: Positive for cough and chest tightness. Negative for shortness of breath.   Cardiovascular: Negative.        Objective:   Physical Exam  Constitutional: He appears well-developed and well-nourished.  HENT:  Right Ear: External ear normal.  Left Ear: External ear normal.  Nose: Nose normal.  Mouth/Throat: Oropharynx is clear and moist.  Eyes: Conjunctivae are normal.  Pulmonary/Chest: Effort normal. No respiratory distress. He has no wheezes. He has no rales.  Scattered rhonchi   Lymphadenopathy:    He has no cervical adenopathy.          Assessment & Plan:  Bronchitis, treat with a Zpack.

## 2015-11-10 ENCOUNTER — Institutional Professional Consult (permissible substitution): Payer: BC Managed Care – PPO | Admitting: Internal Medicine

## 2016-01-03 ENCOUNTER — Other Ambulatory Visit (INDEPENDENT_AMBULATORY_CARE_PROVIDER_SITE_OTHER): Payer: BC Managed Care – PPO

## 2016-01-03 DIAGNOSIS — E119 Type 2 diabetes mellitus without complications: Secondary | ICD-10-CM | POA: Diagnosis not present

## 2016-01-03 LAB — HEMOGLOBIN A1C: HEMOGLOBIN A1C: 6.3 % (ref 4.6–6.5)

## 2016-01-09 ENCOUNTER — Telehealth: Payer: Self-pay | Admitting: Family Medicine

## 2016-01-09 ENCOUNTER — Encounter: Payer: BC Managed Care – PPO | Admitting: Internal Medicine

## 2016-01-09 NOTE — Progress Notes (Signed)
Document opened and reviewed for OV but appt  NS same day .   

## 2016-01-09 NOTE — Telephone Encounter (Signed)
Tried to reach the pt by telephone to inform him of missed appointment today.  Left a message for him to call the office and re schedule appointment to go over lab work.  Will mail a copy of lab results.

## 2016-06-26 NOTE — Progress Notes (Deleted)
   No chief complaint on file.   HPI: Brad Terry 60 y.o.  ROS: See pertinent positives and negatives per HPI.  Past Medical History:  Diagnosis Date  . CHEST PAIN UNSPECIFIED 01/04/2010   Qualifier: Diagnosis of  By: Brad Dubin, MD, Brad Terry    . Diverticulosis   . Headaches due to old head injury   . Hyperlipidemia   . Internal hemorrhoids   . Renal cyst   . 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 Diabetes Mother   . Diabetes Brother     x 2  . Colon cancer Neg Hx   . Prostate cancer Brother     x 2    Social History   Social History  . Marital status: Married    Spouse name: N/A  . Number of children: N/A  . Years of education: N/A   Social History Main Topics  . Smoking status: Never Smoker  . Smokeless tobacco: Never Used  . Alcohol use No  . Drug use: No  . Sexual activity: Not on file   Other Topics Concern  . 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   Tries to walk with exercise      [place] origin    Outpatient Medications Prior to Visit  Medication Sig Dispense Refill  . azithromycin (ZITHROMAX Z-PAK) 250 MG tablet As directed 6 each 0  . hydrocortisone (ANUSOL-HC) 25 MG suppository Place 1 suppository (25 mg total) rectally at bedtime. 12 suppository 2  . metFORMIN (GLUCOPHAGE-XR) 500 MG 24 hr tablet Take 1 tablet (500 mg total) by mouth daily with breakfast. (Patient not taking: Reported on 11/04/2015) 30 tablet 5  . mometasone (ELOCON) 0.1 % lotion Apply topically daily. 60 mL 1   No facility-administered medications prior to visit.      EXAM:  There were no vitals taken for this visit.  There is no height or weight on file to calculate BMI.  GENERAL: vitals reviewed and listed above, alert, oriented, appears well hydrated and in no acute distress HEENT:  atraumatic, conjunctiva  clear, no obvious abnormalities on inspection of external nose and ears OP : no lesion edema or exudate  NECK: no obvious masses on inspection palpation  LUNGS: clear to auscultation bilaterally, no wheezes, rales or rhonchi, good air movement CV: HRRR, no clubbing cyanosis or  peripheral edema nl cap refill  MS: moves all extremities without noticeable focal  abnormality PSYCH: pleasant and cooperative, no obvious depression or anxiety  ASSESSMENT AND PLAN:  Discussed the following assessment and plan:  No diagnosis found.  -Patient advised to return or notify health care team  if symptoms worsen ,persist or new concerns arise.  There are no Patient Instructions on file for this visit.   Brad Terry. Brad Terry M.D.

## 2016-06-27 ENCOUNTER — Ambulatory Visit: Payer: BC Managed Care – PPO | Admitting: Internal Medicine

## 2016-06-27 NOTE — Progress Notes (Signed)
Pre visit review using our clinic review tool, if applicable. No additional management support is needed unless otherwise documented below in the visit note.  Chief Complaint  Patient presents with  . Hand Pain    Left hand and thumb pain.  Also has a painful "spot" on the back of his right leg.  States the "spot" has been there for several months.    HPI: Livio Hoffecker [name] 60 y.o.  sda new problem  About 3 weeks of left thumb hand thenar area pain deep   And persistent  No injury is r handed does play bongos frequently  But no specific injury . ocass shooting numb pain  Into hand no weakness noted .   Right post calf with tender knot for 2-3 months no injury  Walks 7 miles + per day and lost  Weight with eating better  Overall feels better  hemorroids and snoring gone.  Lost 40 # and a1c was 5.5 on last check and off meds .   Can take some otc advil  ROS: See pertinent positives and negatives per HPI.  Past Medical History:  Diagnosis Date  . CHEST PAIN UNSPECIFIED 01/04/2010   Qualifier: Diagnosis of  By: [name], MD, [name]    . Diverticulosis   . Headaches due to old head injury   . Hyperlipidemia   . Internal hemorrhoids   . Renal cyst   . 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 Diabetes Mother   . Diabetes Brother     x 2  . Colon cancer Neg Hx   . Prostate cancer Brother     x 2    Social History   Social History  . Marital status: Married    Spouse name: N/A  . Number of children: N/A  . Years of education: N/A   Social History Main Topics  . Smoking status: Never Smoker  . Smokeless tobacco: Never Used  . Alcohol use No  . Drug use: No  . Sexual activity: Not Asked   Other Topics Concern  . 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   Tries to walk with exercise      [place]  origin    Outpatient Medications Prior to Visit  Medication Sig Dispense Refill  . azithromycin (ZITHROMAX Z-PAK) 250 MG tablet As directed 6 each 0  . hydrocortisone (ANUSOL-HC) 25 MG suppository Place 1 suppository (25 mg total) rectally at bedtime. 12 suppository 2  . metFORMIN (GLUCOPHAGE-XR) 500 MG 24 hr tablet Take 1 tablet (500 mg total) by mouth daily with breakfast. (Patient not taking: Reported on 11/04/2015) 30 tablet 5  . mometasone (ELOCON) 0.1 % lotion Apply topically daily. 60 mL 1   No facility-administered medications prior to visit.      EXAM:  BP 96/64 (BP Location: Right Arm, Patient Position: Sitting, Cuff Size: Large)   Temp 98.3 F (36.8 C) (Oral)   Ht 5\' 8"  (1.727 m)   Wt 197 lb (89.4 kg)   BMI 29.95 kg/m   Body mass index is 29.95 kg/m.  GENERAL: vitals reviewed and listed above, alert, oriented, appears well hydrated and in no acute distress HEENT: atraumatic, conjunctiva  clear, no obvious abnormalities on inspection of external nose and ears  NECK: no obvious masses on inspection  palpation  MS: moves all extremities without noticeable focal  Abnormality  Left hand ok grip  Tender  Base of thumb but also in thenar web but no bony  sopt  And no crepitus  Grip nl but stronger r than left   No wasting and  ? Less strong finger opposition   Right leg there is a 1.5-2 cm superficial tender nodule seem skin related and no redness but darker skin  And non fluctuant PSYCH: pleasant and cooperative, no obvious depression or anxiety  ASSESSMENT AND PLAN:  Discussed the following assessment and plan:  Left hand pain - ? tendinitis vs other commpression arthritis  has callous  middle finger nl rom  - Plan: DG Wrist 2 Views Left, DG Hand Complete Left  Single skin nodule - tender seem skin related but ? cause  no sx of infection ? If overuse with bongo playing but no hx of same   Conservative rx x ray contact us if needed and can do refer to  Sm if  persistent  or progressive  -Patient advised to return or notify health care team  if symptoms worsen ,persist or new concerns arise.  Patient Instructions   The thumb wrist area may be arthritis tendinitis or overuse injury. Can get [facility] at the [facility] office of your hand and wrist. We'll use ibuprofen 600 mg every 8 hours for about a week and then as needed area Relative rest from use of the left thumb. We'll see if it improves also the nodule pain on your right calf. If not improving after a few weeks and contact us and we can refer to sports medicine for next step evaluation and help.  Keep up the good work with activity healthy diet. It appears that your blood pressure your snoring and her blood sugar well controlled with this.  Wt Readings from Last 3 Encounters:  06/28/16 197 lb (89.4 kg)  11/04/15 230 lb (104.3 kg)  10/10/15 237 lb 11.2 oz (107.8 kg)   BP Readings from Last 3 Encounters:  06/28/16 96/64  11/04/15 117/66  10/10/15 120/80        Mariann Laster K. Beverlyn Mcginness M.D.

## 2016-06-28 ENCOUNTER — Encounter: Payer: Self-pay | Admitting: Internal Medicine

## 2016-06-28 ENCOUNTER — Ambulatory Visit (INDEPENDENT_AMBULATORY_CARE_PROVIDER_SITE_OTHER)
Admission: RE | Admit: 2016-06-28 | Discharge: 2016-06-28 | Disposition: A | Discharge status: Home / Self Care | Payer: BC Managed Care – PPO | Source: Ambulatory Visit | Attending: Internal Medicine | Admitting: Internal Medicine

## 2016-06-28 ENCOUNTER — Ambulatory Visit (INDEPENDENT_AMBULATORY_CARE_PROVIDER_SITE_OTHER): Payer: BC Managed Care – PPO | Admitting: Internal Medicine

## 2016-06-28 VITALS — BP 96/64 | Temp 98.3°F | Ht 68.0 in | Wt 197.0 lb

## 2016-06-28 DIAGNOSIS — R229 Localized swelling, mass and lump, unspecified: Secondary | ICD-10-CM | POA: Diagnosis not present

## 2016-06-28 DIAGNOSIS — M79642 Pain in left hand: Secondary | ICD-10-CM

## 2016-06-28 MED ORDER — IBUPROFEN 600 MG PO TABS: ORAL_TABLET | ORAL | 1 refills | Status: DC

## 2016-06-28 NOTE — Patient Instructions (Signed)
The thumb wrist area may be arthritis tendinitis or overuse injury. Can get x-ray at the [facility] office of your hand and wrist. We'll use ibuprofen 600 mg every 8 hours for about a week and then as needed area Relative rest from use of the left thumb. We'll see if it improves also the nodule pain on your right calf. If not improving after a few weeks and contact us and we can refer to sports medicine for next step evaluation and help.  Keep up the good work with activity healthy diet. It appears that your blood pressure your snoring and her blood sugar well controlled with this.  Wt Readings from Last 3 Encounters:  06/28/16 197 lb (89.4 kg)  11/04/15 230 lb (104.3 kg)  10/10/15 237 lb 11.2 oz (107.8 kg)   BP Readings from Last 3 Encounters:  06/28/16 96/64  11/04/15 117/66  10/10/15 120/80

## 2016-06-29 ENCOUNTER — Telehealth: Payer: Self-pay

## 2016-06-29 NOTE — Telephone Encounter (Signed)
Called patient. Gave lab results and instructions. Patient verbalized understanding.   

## 2016-06-29 NOTE — Telephone Encounter (Signed)
-----   Message from Burnis Medin, MD sent at 06/28/2016  4:58 PM EDT ----- Some arthritis but no acute finding   Proceed with plan  As discussed

## 2016-12-04 ENCOUNTER — Encounter: Payer: Self-pay | Admitting: Internal Medicine

## 2016-12-04 ENCOUNTER — Ambulatory Visit (INDEPENDENT_AMBULATORY_CARE_PROVIDER_SITE_OTHER): Payer: BC Managed Care – PPO | Admitting: Internal Medicine

## 2016-12-04 VITALS — BP 120/76 | HR 76 | Temp 99.0°F | Wt 198.8 lb

## 2016-12-04 DIAGNOSIS — R05 Cough: Secondary | ICD-10-CM

## 2016-12-04 DIAGNOSIS — R053 Chronic cough: Secondary | ICD-10-CM

## 2016-12-04 DIAGNOSIS — R6889 Other general symptoms and signs: Secondary | ICD-10-CM | POA: Diagnosis not present

## 2016-12-04 MED ORDER — HYDROCODONE-HOMATROPINE 5-1.5 MG/5ML PO SYRP: ORAL_SOLUTION | ORAL | 0 refills | Status: DC

## 2016-12-04 NOTE — Patient Instructions (Addendum)
This acts like  A flu l illness.   And get a chest x ray to check for pneumonia that I am not hearing   On exam today if getting worse instead of better.   Antibiotics wont help you get better wicker unless  This is a bacterial infection ( not viral)   But   Should be getting better by end of week.   If coughing blood of fever  Over 100.3 then  Contact us for advice  Can use cough med for comfort but meds can cause drowsiness  Or fatigue.      Influenza, Adult Influenza, more commonly known as "the flu," is a viral infection that primarily affects the respiratory tract. The respiratory tract includes organs that help you breathe, such as the lungs, nose, and throat. The flu causes many common cold symptoms, as well as a high fever and body aches. The flu spreads easily from person to person (is contagious). Getting a flu shot (influenza vaccination) every year is the best way to prevent influenza. What are the causes? Influenza is caused by a virus. You can catch the virus by:  Breathing in droplets from an infected person's cough or sneeze.  Touching something that was recently contaminated with the virus and then touching your mouth, nose, or eyes. What increases the risk? The following factors may make you more likely to get the flu:  Not cleaning your hands frequently with soap and water or alcohol-based hand sanitizer.  Having close contact with many people during cold and flu season.  Touching your mouth, eyes, or nose without washing or sanitizing your hands first.  Not drinking enough fluids or not eating a healthy diet.  Not getting enough sleep or exercise.  Being under a high amount of stress.  Not getting a yearly (annual) flu shot. You may be at a higher risk of complications from the flu, such as a severe lung infection (pneumonia), if you:  Are over the age of 66.  Are pregnant.  Have a weakened disease-fighting system (immune system). You may have a weakened  immune system if you:  Have HIV or AIDS.  Are undergoing chemotherapy.  Aretaking medicines that reduce the activity of (suppress) the immune system.  Have a long-term (chronic) illness, such as heart disease, kidney disease, diabetes, or lung disease.  Have a liver disorder.  Are obese.  Have anemia. What are the signs or symptoms? Symptoms of this condition typically last 4-10 days and may include:  Fever.  Chills.  Headache, body aches, or muscle aches.  Sore throat.  Cough.  Runny or congested nose.  Chest discomfort and cough.  Poor appetite.  Weakness or tiredness (fatigue).  Dizziness.  Nausea or vomiting. How is this diagnosed? This condition may be diagnosed based on your medical history and a physical exam. Your health care provider may do a nose or throat swab test to confirm the diagnosis. How is this treated? If influenza is detected early, you can be treated with antiviral medicine that can reduce the length of your illness and the severity of your symptoms. This medicine may be given by mouth (orally) or through an IV tube that is inserted in one of your veins. The goal of treatment is to relieve symptoms by taking care of yourself at home. This may include taking over-the-counter medicines, drinking plenty of fluids, and adding humidity to the air in your home. In some cases, influenza goes away on its own. Severe influenza or  complications from influenza may be treated in a hospital. Follow these instructions at home:  Take over-the-counter and prescription medicines only as told by your health care provider.  Use a cool mist humidifier to add humidity to the air in your home. This can make breathing easier.  Rest as needed.  Drink enough fluid to keep your urine clear or pale yellow.  Cover your mouth and nose when you cough or sneeze.  Wash your hands with soap and water often, especially after you cough or sneeze. If soap and water are not  available, use hand sanitizer.  Stay home from work or school as told by your health care provider. Unless you are visiting your health care provider, try to avoid leaving home until your fever has been gone for 24 hours without the use of medicine.  Keep all follow-up visits as told by your health care provider. This is important. How is this prevented?  Getting an annual flu shot is the best way to avoid getting the flu. You may get the flu shot in late summer, fall, or winter. Ask your health care provider when you should get your flu shot.  Wash your hands often or use hand sanitizer often.  Avoid contact with people who are sick during cold and flu season.  Eat a healthy diet, drink plenty of fluids, get enough sleep, and exercise regularly. Contact a health care provider if:  You develop new symptoms.  You have:  Chest pain.  Diarrhea.  A fever.  Your cough gets worse.  You produce more mucus.  You feel nauseous or you vomit. Get help right away if:  You develop shortness of breath or difficulty breathing.  Your skin or nails turn a bluish color.  You have severe pain or stiffness in your neck.  You develop a sudden headache or sudden pain in your face or ear.  You cannot stop vomiting. This information is not intended to replace advice given to you by your health care provider. Make sure you discuss any questions you have with your health care provider. Document Released: 10/19/2000 Document Revised: 03/29/2016 Document Reviewed: 08/16/2015 Elsevier Interactive Patient Education  2017 [employer].

## 2016-12-04 NOTE — Progress Notes (Signed)
Chief Complaint  Patient presents with  . Cough    fever, coughing ,dizzyness, body aches    HPI: Brad Terry 61 y.o.  sda    After going to visit sick people at [facility]  Last week  Felt  chills and sweating .  Then cough . Getting  Worse .    Body aches still there . Exposure about 8 days ago. He complains of body aches cough malaise not feeling well and then vomiting syncope taking TheraFlu which does help sleep. Temperature 99.0.     Feels tired no cp   Ask a bout antibiotic  ROS: See pertinent positives and negatives per HPI.  Past Medical History:  Diagnosis Date  . CHEST PAIN UNSPECIFIED 01/04/2010   Qualifier: Diagnosis of  By: [name], MD, [name]    . Diverticulosis   . Headaches due to old head injury   . Hyperlipidemia   . Internal hemorrhoids   . Renal cyst   . 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 Diabetes Mother   . Diabetes Brother     x 2  . Colon cancer Neg Hx   . Prostate cancer Brother     x 2    Social History   Social History  . Marital status: Married    Spouse name: N/A  . Number of children: N/A  . Years of education: N/A   Social History Main Topics  . Smoking status: Never Smoker  . Smokeless tobacco: Never Used  . Alcohol use No  . Drug use: No  . Sexual activity: Not Asked   Other Topics Concern  . 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   Tries to walk with exercise      [place] country of origin    Outpatient Medications Prior to Visit  Medication Sig Dispense Refill  . ibuprofen (ADVIL,MOTRIN) 600 MG tablet 1 po every 8 hours for 7 days and than as needed q8hours 30 tablet 1   No facility-administered medications prior to visit.      EXAM:  BP 120/76 (BP Location: Left Arm, Patient Position: Sitting, Cuff Size: Normal)   Pulse 76   Temp 99 F (37.2  C) (Oral)   Wt 198 lb 12.8 oz (90.2 kg)   SpO2 97%   BMI 30.23 kg/m   Body mass index is 30.23 kg/m.  GENERAL: vitals reviewed and listed above, alert, oriented, appears well hydrated and in no acute distress no n toxic  ocass cough  Nl respirations midl congestion  HEENT: atraumatic, conjunctiva  clear, no obvious abnormalities on inspection of external nose and ears tms clear  OP : no lesion edema or exudate  NECK: no obvious masses on inspection palpation  LUNGS: clear to auscultation bilaterally, no wheezes, rales or rhonchi, good air movement CV: HRRR, no clubbing cyanosis or  peripheral edema nl cap refill  MS: moves all extremities without noticeable focal  abnormality PSYCH: pleasant and cooperative, no obvious depression or anxiety  ASSESSMENT AND PLAN:  Discussed the following assessment and plan:  Flu-like symptoms - Plan: DG Chest 2 View  Cough, persistent - Plan: DG Chest 2 View   Expectant management. At this time  Risk fo antibiotic more than benefit  but  Low threshold to get x ray if  Relapsing etc  Exam is reassuring today  meds x can cause fatige  -Patient advised to return or notify health care team  if symptoms worsen ,persist or new concerns arise.  Patient Instructions  This acts like  A flu l illness.   And get a chest x ray to check for pneumonia that I am not hearing   On exam today if getting worse instead of better.   Antibiotics wont help you get better wicker unless  This is a bacterial infection ( not viral)   But   Should be getting better by end of week.   If coughing blood of fever  Over 100.3 then  Contact us for advice  Can use cough med for comfort but meds can cause drowsiness  Or fatigue.      Influenza, Adult Influenza, more commonly known as "the flu," is a viral infection that primarily affects the respiratory tract. The respiratory tract includes organs that help you breathe, such as the lungs, nose, and throat. The flu causes many  common cold symptoms, as well as a high fever and body aches. The flu spreads easily from person to person (is contagious). Getting a flu shot (influenza vaccination) every year is the best way to prevent influenza. What are the causes? Influenza is caused by a virus. You can catch the virus by:  Breathing in droplets from an infected person's cough or sneeze.  Touching something that was recently contaminated with the virus and then touching your mouth, nose, or eyes. What increases the risk? The following factors may make you more likely to get the flu:  Not cleaning your hands frequently with soap and water or alcohol-based hand sanitizer.  Having close contact with many people during cold and flu season.  Touching your mouth, eyes, or nose without washing or sanitizing your hands first.  Not drinking enough fluids or not eating a healthy diet.  Not getting enough sleep or exercise.  Being under a high amount of stress.  Not getting a yearly (annual) flu shot. You may be at a higher risk of complications from the flu, such as a severe lung infection (pneumonia), if you:  Are over the age of 50.  Are pregnant.  Have a weakened disease-fighting system (immune system). You may have a weakened immune system if you:  Have HIV or AIDS.  Are undergoing chemotherapy.  Aretaking medicines that reduce the activity of (suppress) the immune system.  Have a long-term (chronic) illness, such as heart disease, kidney disease, diabetes, or lung disease.  Have a liver disorder.  Are obese.  Have anemia. What are the signs or symptoms? Symptoms of this condition typically last 4-10 days and may include:  Fever.  Chills.  Headache, body aches, or muscle aches.  Sore throat.  Cough.  Runny or congested nose.  Chest discomfort and cough.  Poor appetite.  Weakness or tiredness (fatigue).  Dizziness.  Nausea or vomiting. How is this diagnosed? This condition may be  diagnosed based on your medical history and a physical exam. Your health care provider may do a nose or throat swab test to confirm the diagnosis. How is this treated? If influenza is detected early, you can be treated with antiviral medicine that can reduce the length of your illness and the severity of your symptoms. This medicine may be given by mouth (orally) or through an IV tube that is inserted in one of your veins. The goal of treatment is to relieve symptoms by  taking care of yourself at home. This may include taking over-the-counter medicines, drinking plenty of fluids, and adding humidity to the air in your home. In some cases, influenza goes away on its own. Severe influenza or complications from influenza may be treated in a hospital. Follow these instructions at home:  Take over-the-counter and prescription medicines only as told by your health care provider.  Use a cool mist humidifier to add humidity to the air in your home. This can make breathing easier.  Rest as needed.  Drink enough fluid to keep your urine clear or pale yellow.  Cover your mouth and nose when you cough or sneeze.  Wash your hands with soap and water often, especially after you cough or sneeze. If soap and water are not available, use hand sanitizer.  Stay home from work or school as told by your health care provider. Unless you are visiting your health care provider, try to avoid leaving home until your fever has been gone for 24 hours without the use of medicine.  Keep all follow-up visits as told by your health care provider. This is important. How is this prevented?  Getting an annual flu shot is the best way to avoid getting the flu. You may get the flu shot in late summer, fall, or winter. Ask your health care provider when you should get your flu shot.  Wash your hands often or use hand sanitizer often.  Avoid contact with people who are sick during cold and flu season.  Eat a healthy diet,  drink plenty of fluids, get enough sleep, and exercise regularly. Contact a health care provider if:  You develop new symptoms.  You have:  Chest pain.  Diarrhea.  A fever.  Your cough gets worse.  You produce more mucus.  You feel nauseous or you vomit. Get help right away if:  You develop shortness of breath or difficulty breathing.  Your skin or nails turn a bluish color.  You have severe pain or stiffness in your neck.  You develop a sudden headache or sudden pain in your face or ear.  You cannot stop vomiting. This information is not intended to replace advice given to you by your health care provider. Make sure you discuss any questions you have with your health care provider. Document Released: 10/19/2000 Document Revised: 03/29/2016 Document Reviewed: 08/16/2015 Elsevier Interactive Patient Education  2017 [place] K. Panosh M.D.

## 2016-12-04 NOTE — Progress Notes (Signed)
Pre visit review using our clinic review tool, if applicable. No additional management support is needed unless otherwise documented below in the visit note. 

## 2016-12-07 ENCOUNTER — Ambulatory Visit (INDEPENDENT_AMBULATORY_CARE_PROVIDER_SITE_OTHER)
Admission: RE | Admit: 2016-12-07 | Discharge: 2016-12-07 | Disposition: A | Discharge status: Home / Self Care | Payer: BC Managed Care – PPO | Source: Ambulatory Visit | Attending: Internal Medicine | Admitting: Internal Medicine

## 2016-12-07 DIAGNOSIS — R053 Chronic cough: Secondary | ICD-10-CM

## 2016-12-07 DIAGNOSIS — R05 Cough: Secondary | ICD-10-CM

## 2016-12-07 DIAGNOSIS — R6889 Other general symptoms and signs: Secondary | ICD-10-CM

## 2017-04-26 NOTE — Progress Notes (Signed)
Chief Complaint  Patient presents with  . Annual Exam    HPI: Patient  Brad Terry  61 y.o. comes in today for Preventive Health Care visit  He has continued to do walking and avoiding processed carbs and sugars. He is maintained to 60 pound weight loss. No heart-lung symptoms get some aches and pains but they go away after his walking starts. No unusual bleeding. Has a little bump on his right middle finger and the little lump that he has in his right lower extremity with no change.  Health Maintenance  Topic Date Due  . PNEUMOCOCCAL POLYSACCHARIDE VACCINE (1) 01/08/1958  . FOOT EXAM  01/08/1966  . OPHTHALMOLOGY EXAM  01/08/1966  . URINE MICROALBUMIN  01/08/1966  . TETANUS/TDAP  01/10/2016  . HEMOGLOBIN A1C  07/02/2016  . Hepatitis C Screening  04/29/2018 (Originally [birth year])  . HIV Screening  04/29/2018 (Originally 01/09/1971)  . INFLUENZA VACCINE  06/05/2017  . COLONOSCOPY  12/28/2019   Health Maintenance Review LIFESTYLE:  Exercise:  Walking miles   Tobacco/ETS:n Alcohol: n Sugar beverages:n Sleep:enough some decrease by choice  Drug use: no HH of 6+  Work: yes  Minister plus cooking    ROS: vision last check 5 years ago  Teeth work completed  GEN/ HEENT: No fever, significant weight changes sweats headaches vision problems hearing changes, CV/ PULM; No chest pain shortness of breath cough, syncope,edema  change in exercise tolerance. GI /GU: No adominal pain, vomiting, change in bowel habits. No blood in the stool. No significant GU symptoms. SKIN/HEME: ,no acute skin rashes suspicious lesions or bleeding. No lymphadenopathy, nodules, masses.  NEURO/ PSYCH:  No neurologic signs such as weakness numbness. No depression anxiety. IMM/ Allergy: No unusual infections.  Allergy .   REST of 12 system review negative except as per HPI   Past Medical History:  Diagnosis Date  . CHEST PAIN UNSPECIFIED 01/04/2010   Qualifier: Diagnosis of  By: [name], MD,  [name]    . Diverticulosis   . Headaches due to old head injury   . Hyperlipidemia   . Internal hemorrhoids   . Renal cyst   . 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 KNEE ARTHROSCOPY     right    Family History  Problem Relation Age of Onset  . Diabetes Mother   . Diabetes Brother        x 2  . Prostate cancer Brother        x 2  . Colon cancer Neg Hx     Social History   Social History  . Marital status: Married    Spouse name: N/A  . Number of children: N/A  . Years of education: N/A   Social History Main Topics  . Smoking status: Never Smoker  . Smokeless tobacco: Never Used  . Alcohol use No  . Drug use: No  . Sexual activity: Not Asked   Other Topics Concern  . 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   Tries to walk with exercise      [place] of origin    Outpatient Medications Prior to Visit  Medication Sig Dispense Refill  . HYDROcodone-homatropine (HYCODAN) 5-1.5 MG/5ML syrup 1 tsp at night or every 4-6 hours if needed for cough 120 mL 0  . ibuprofen (ADVIL,MOTRIN)  600 MG tablet 1 po every 8 hours for 7 days and than as needed q8hours 30 tablet 1   No facility-administered medications prior to visit.      EXAM:  BP 110/68 (BP Location: Right Arm, Patient Position: Sitting, Cuff Size: Normal)   Pulse 77   Temp 98.5 F (36.9 C) (Oral)   Ht '5\' 8"'  (1.727 m)   Wt 191 lb 3.2 oz (86.7 kg)   BMI 29.07 kg/m   Body mass index is 29.07 kg/m. Wt Readings from Last 3 Encounters:  04/29/17 191 lb 3.2 oz (86.7 kg)  12/04/16 198 lb 12.8 oz (90.2 kg)  06/28/16 197 lb (89.4 kg)    Physical Exam: Vital signs reviewed BJS:EGBT is a well-developed well-nourished alert cooperative    who appearsr stated age in no acute distress.  HEENT: normocephalic atraumatic , Eyes: PERRL EOM's  full, conjunctiva clear, Nares: paten,t no deformity discharge or tenderness., Ears: no deformity EAC's clear TMs with normal landmarks. Mouth: clear OP, no lesions, edema.  Moist mucous membranes. Dentition in adequate repair. NECK: supple without masses, thyromegaly or bruits. CHEST/PULM:  Clear to auscultation and percussion breath sounds equal no wheeze , rales or rhonchi. No chest wall deformities or tenderness. Breast: normal by inspection . No dimpling, discharge, masses, tenderness or discharge . CV: PMI is nondisplaced, S1 S2 no gallops, murmurs, rubs. Peripheral pulses are full without delay.No JVD .  ABDOMEN: Bowel sounds normal nontender  No guard or rebound, no hepato splenomegal no CVA tenderness.  No hernia. Extremtities:  No clubbing cyanosis or edema, no acute joint swelling or redness no focal atrophy NEURO:  Oriented x3, cranial nerves 3-12 appear to be intact, no obvious focal weakness,gait within normal limits no abnormal reflexes or asymmetrical rectal hem tag no masses prostate 1+ nontendern  Consistency  SKIN: No acute rashes normal turgor, color, no bruising or petechiae. Right middle finger  Callus corn like a rea 4 mm with break in skin( pat says pickeda t this am)   RLW 8 mm ? Skin fibroma  No skin changes  PSYCH: Oriented, good eye contact, no obvious depression anxiety, cognition and judgment appear normal. LN: no cervical axillary inguinal adenopathy    BP Readings from Last 3 Encounters:  04/29/17 110/68  12/04/16 120/76  06/28/16 96/64    Lab results reviewed with patient   ASSESSMENT AND PLAN:  Discussed the following assessment and plan:  Visit for preventive health examination - Plan: CBC with Differential/Platelet, Basic metabolic panel, Lipid panel, Hepatic function panel, PSA, Hemoglobin A1c  Thrombocytopenia ([place]) - Plan: CBC with Differential/Platelet, Basic metabolic panel, Lipid panel, Hepatic function panel, Hemoglobin A1c  Hyperlipidemia,  unspecified hyperlipidemia type - Plan: CBC with Differential/Platelet, Basic metabolic panel, Lipid panel, Hepatic function panel, Hemoglobin A1c  Screening PSA (prostate specific antigen) - Plan: PSA  Fasting hyperglycemia - Plan: CBC with Differential/Platelet, Basic metabolic panel, Lipid panel, Hepatic function panel, Hemoglobin A1c His blood sugar issues basically have been in remission since he lost weight changed his eating and increased exercise. Suspect his metabolic changes we'll sustained for now. Labs today No history of bleeding or thrombocytopenia follow had previous hematology consult in this regard. utd on HCM  ifa ll ok then yearly check  Shared Decision Making  for psa testing. Patient Care Team: Panosh, [name], MD as PCP - General Olevia Perches, Lowella Bandy, MD (Inactive) (Gastroenterology) Curt Bears, MD (Hematology and Oncology) Patient Instructions  Glad you are doing so well.   Continue  lifestyle intervention healthy eating and exercise .  Will notify you  of labs when available.   Preventive Care 40-64 Years, Male Preventive care refers to lifestyle choices and visits with your health care provider that can promote health and wellness. What does preventive care include?  A yearly physical exam. This is also called an annual well check.  Dental exams once or twice a year.  Routine eye exams. Ask your health care provider how often you should have your eyes checked.  Personal lifestyle choices, including: ? Daily care of your teeth and gums. ? Regular physical activity. ? Eating a healthy diet. ? Avoiding tobacco and drug use. ? Limiting alcohol use. ? Practicing safe sex. ? Taking low-dose aspirin every day starting at age 1. What happens during an annual well check? The services and screenings done by your health care provider during your annual well check will depend on your age, overall health, lifestyle risk factors, and family history of  disease. Counseling Your health care provider may ask you questions about your:  Alcohol use.  Tobacco use.  Drug use.  Emotional well-being.  Home and relationship well-being.  Sexual activity.  Eating habits.  Work and work Statistician.  Screening You may have the following tests or measurements:  Height, weight, and BMI.  Blood pressure.  Lipid and cholesterol levels. These may be checked every 5 years, or more frequently if you are over 68 years old.  Skin check.  Lung cancer screening. You may have this screening every year starting at age 6 if you have a 30-pack-year history of smoking and currently smoke or have quit within the past 15 years.  Fecal occult blood test (FOBT) of the stool. You may have this test every year starting at age 58.  Flexible sigmoidoscopy or colonoscopy. You may have a sigmoidoscopy every 5 years or a colonoscopy every 10 years starting at age 61.  Prostate cancer screening. Recommendations will vary depending on your family history and other risks.  Hepatitis C blood test.  Hepatitis B blood test.  Sexually transmitted disease (STD) testing.  Diabetes screening. This is done by checking your blood sugar (glucose) after you have not eaten for a while (fasting). You may have this done every 1-3 years.  Discuss your test results, treatment options, and if necessary, the need for more tests with your health care provider. Vaccines Your health care provider may recommend certain vaccines, such as:  Influenza vaccine. This is recommended every year.  Tetanus, diphtheria, and acellular pertussis (Tdap, Td) vaccine. You may need a Td booster every 10 years.  Varicella vaccine. You may need this if you have not been vaccinated.  Zoster vaccine. You may need this after age 67.  Measles, mumps, and rubella (MMR) vaccine. You may need at least one dose of MMR if you were born in 1957 or later. You may also need a second  dose.  Pneumococcal 13-valent conjugate (PCV13) vaccine. You may need this if you have certain conditions and have not been vaccinated.  Pneumococcal polysaccharide (PPSV23) vaccine. You may need one or two doses if you smoke cigarettes or if you have certain conditions.  Meningococcal vaccine. You may need this if you have certain conditions.  Hepatitis A vaccine. You may need this if you have certain conditions or if you travel or work in places where you may be exposed to hepatitis A.  Hepatitis B vaccine. You may need this if you have certain conditions or if you travel  or work in places where you may be exposed to hepatitis B.  Haemophilus influenzae type b (Hib) vaccine. You may need this if you have certain risk factors.  Talk to your health care provider about which screenings and vaccines you need and how often you need them. This information is not intended to replace advice given to you by your health care provider. Make sure you discuss any questions you have with your health care provider. Document Released: 11/18/2015 Document Revised: 07/11/2016 Document Reviewed: 08/23/2015 Elsevier Interactive Patient Education  2017 Velva K. Panosh M.D.

## 2017-04-29 ENCOUNTER — Ambulatory Visit (INDEPENDENT_AMBULATORY_CARE_PROVIDER_SITE_OTHER): Payer: BC Managed Care – PPO | Admitting: Internal Medicine

## 2017-04-29 ENCOUNTER — Encounter: Payer: Self-pay | Admitting: Internal Medicine

## 2017-04-29 VITALS — BP 110/68 | HR 77 | Temp 98.5°F | Ht 68.0 in | Wt 191.2 lb

## 2017-04-29 DIAGNOSIS — Z0000 Encounter for general adult medical examination without abnormal findings: Secondary | ICD-10-CM

## 2017-04-29 DIAGNOSIS — Z125 Encounter for screening for malignant neoplasm of prostate: Secondary | ICD-10-CM | POA: Diagnosis not present

## 2017-04-29 DIAGNOSIS — R7301 Impaired fasting glucose: Secondary | ICD-10-CM | POA: Diagnosis not present

## 2017-04-29 DIAGNOSIS — D696 Thrombocytopenia, unspecified: Secondary | ICD-10-CM

## 2017-04-29 DIAGNOSIS — E785 Hyperlipidemia, unspecified: Secondary | ICD-10-CM | POA: Diagnosis not present

## 2017-04-29 LAB — CBC WITH DIFFERENTIAL/PLATELET
BASOS ABS: 0 10*3/uL (ref 0.0–0.1)
BASOS PCT: 0.6 % (ref 0.0–3.0)
Eosinophils Absolute: 0.1 10*3/uL (ref 0.0–0.7)
Eosinophils Relative: 4.7 % (ref 0.0–5.0)
HEMATOCRIT: 37 % — AB (ref 39.0–52.0)
HEMOGLOBIN: 12.5 g/dL — AB (ref 13.0–17.0)
LYMPHS PCT: 43.5 % (ref 12.0–46.0)
Lymphs Abs: 1.1 10*3/uL (ref 0.7–4.0)
MCHC: 33.8 g/dL (ref 30.0–36.0)
MCV: 88.6 fl (ref 78.0–100.0)
Monocytes Absolute: 0.2 10*3/uL (ref 0.1–1.0)
Monocytes Relative: 7.3 % (ref 3.0–12.0)
Neutro Abs: 1.1 10*3/uL — ABNORMAL LOW (ref 1.4–7.7)
Neutrophils Relative %: 43.9 % (ref 43.0–77.0)
Platelets: 97 10*3/uL — ABNORMAL LOW (ref 150.0–400.0)
RBC: 4.18 Mil/uL — AB (ref 4.22–5.81)
RDW: 13.2 % (ref 11.5–15.5)
WBC: 2.5 10*3/uL — ABNORMAL LOW (ref 4.0–10.5)

## 2017-04-29 LAB — BASIC METABOLIC PANEL
BUN: 14 mg/dL (ref 6–23)
CHLORIDE: 105 meq/L (ref 96–112)
CO2: 31 meq/L (ref 19–32)
Calcium: 9.3 mg/dL (ref 8.4–10.5)
Creatinine, Ser: 0.94 mg/dL (ref 0.40–1.50)
GFR: 104.82 mL/min (ref >60.00)
GLUCOSE: 77 mg/dL (ref 70–99)
POTASSIUM: 4.1 meq/L (ref 3.5–5.1)
SODIUM: 140 meq/L (ref 135–145)

## 2017-04-29 LAB — HEPATIC FUNCTION PANEL
ALBUMIN: 4.1 g/dL (ref 3.5–5.2)
ALT: 35 U/L (ref 0–53)
AST: 24 U/L (ref 0–37)
Alkaline Phosphatase: 54 U/L (ref 39–117)
BILIRUBIN TOTAL: 0.7 mg/dL (ref 0.2–1.2)
Bilirubin, Direct: 0.1 mg/dL (ref 0.0–0.3)
Total Protein: 6.2 g/dL (ref 6.0–8.3)

## 2017-04-29 LAB — LIPID PANEL
CHOL/HDL RATIO: 3
Cholesterol: 187 mg/dL (ref 0–200)
HDL: 54.6 mg/dL (ref >39.00)
LDL CALC: 125 mg/dL — AB (ref 0–99)
NonHDL: 132.63
Triglycerides: 37 mg/dL (ref 0.0–149.0)
VLDL: 7.4 mg/dL (ref 0.0–40.0)

## 2017-04-29 LAB — HEMOGLOBIN A1C: HEMOGLOBIN A1C: 5.8 % (ref 4.6–6.5)

## 2017-04-29 LAB — PSA: PSA: 0.79 ng/mL (ref 0.10–4.00)

## 2017-04-29 NOTE — Patient Instructions (Signed)
Glad you are doing so well.   Continue lifestyle intervention healthy eating and exercise .  Will notify you  of labs when available.   Preventive Care 40-64 Years, Male Preventive care refers to lifestyle choices and visits with your health care provider that can promote health and wellness. What does preventive care include?  A yearly physical exam. This is also called an annual well check.  Dental exams once or twice a year.  Routine eye exams. Ask your health care provider how often you should have your eyes checked.  Personal lifestyle choices, including: ? Daily care of your teeth and gums. ? Regular physical activity. ? Eating a healthy diet. ? Avoiding tobacco and drug use. ? Limiting alcohol use. ? Practicing safe sex. ? Taking low-dose aspirin every day starting at age 64. What happens during an annual well check? The services and screenings done by your health care provider during your annual well check will depend on your age, overall health, lifestyle risk factors, and family history of disease. Counseling Your health care provider may ask you questions about your:  Alcohol use.  Tobacco use.  Drug use.  Emotional well-being.  Home and relationship well-being.  Sexual activity.  Eating habits.  Work and work Statistician.  Screening You may have the following tests or measurements:  Height, weight, and BMI.  Blood pressure.  Lipid and cholesterol levels. These may be checked every 5 years, or more frequently if you are over 45 years old.  Skin check.  Lung cancer screening. You may have this screening every year starting at age 42 if you have a 30-pack-year history of smoking and currently smoke or have quit within the past 15 years.  Fecal occult blood test (FOBT) of the stool. You may have this test every year starting at age 35.  Flexible sigmoidoscopy or colonoscopy. You may have a sigmoidoscopy every 5 years or a colonoscopy every 10 years  starting at age 75.  Prostate cancer screening. Recommendations will vary depending on your family history and other risks.  Hepatitis C blood test.  Hepatitis B blood test.  Sexually transmitted disease (STD) testing.  Diabetes screening. This is done by checking your blood sugar (glucose) after you have not eaten for a while (fasting). You may have this done every 1-3 years.  Discuss your test results, treatment options, and if necessary, the need for more tests with your health care provider. Vaccines Your health care provider may recommend certain vaccines, such as:  Influenza vaccine. This is recommended every year.  Tetanus, diphtheria, and acellular pertussis (Tdap, Td) vaccine. You may need a Td booster every 10 years.  Varicella vaccine. You may need this if you have not been vaccinated.  Zoster vaccine. You may need this after age 28.  Measles, mumps, and rubella (MMR) vaccine. You may need at least one dose of MMR if you were born in 09/21/1956 or later. You may also need a second dose.  Pneumococcal 13-valent conjugate (PCV13) vaccine. You may need this if you have certain conditions and have not been vaccinated.  Pneumococcal polysaccharide (PPSV23) vaccine. You may need one or two doses if you smoke cigarettes or if you have certain conditions.  Meningococcal vaccine. You may need this if you have certain conditions.  Hepatitis A vaccine. You may need this if you have certain conditions or if you travel or work in places where you may be exposed to hepatitis A.  Hepatitis B vaccine. You may need this if  you have certain conditions or if you travel or work in places where you may be exposed to hepatitis B.  Haemophilus influenzae type b (Hib) vaccine. You may need this if you have certain risk factors.  Talk to your health care provider about which screenings and vaccines you need and how often you need them. This information is not intended to replace advice given to  you by your health care provider. Make sure you discuss any questions you have with your health care provider. Document Released: 11/18/2015 Document Revised: 07/11/2016 Document Reviewed: 08/23/2015 Elsevier Interactive Patient Education  2017 [employer].

## 2017-05-01 ENCOUNTER — Other Ambulatory Visit: Payer: Self-pay | Admitting: Emergency Medicine

## 2017-05-01 DIAGNOSIS — D72819 Decreased white blood cell count, unspecified: Secondary | ICD-10-CM

## 2017-05-01 DIAGNOSIS — D696 Thrombocytopenia, unspecified: Secondary | ICD-10-CM

## 2017-06-04 ENCOUNTER — Other Ambulatory Visit (INDEPENDENT_AMBULATORY_CARE_PROVIDER_SITE_OTHER): Payer: BC Managed Care – PPO

## 2017-06-04 DIAGNOSIS — D72819 Decreased white blood cell count, unspecified: Secondary | ICD-10-CM

## 2017-06-04 DIAGNOSIS — D696 Thrombocytopenia, unspecified: Secondary | ICD-10-CM | POA: Diagnosis not present

## 2017-06-04 LAB — CBC WITH DIFFERENTIAL/PLATELET
BASOS PCT: 0.5 % (ref 0.0–3.0)
Basophils Absolute: 0 10*3/uL (ref 0.0–0.1)
EOS PCT: 4.4 % (ref 0.0–5.0)
Eosinophils Absolute: 0.1 10*3/uL (ref 0.0–0.7)
HCT: 38.1 % — ABNORMAL LOW (ref 39.0–52.0)
Hemoglobin: 12.4 g/dL — ABNORMAL LOW (ref 13.0–17.0)
LYMPHS ABS: 1.2 10*3/uL (ref 0.7–4.0)
Lymphocytes Relative: 40.3 % (ref 12.0–46.0)
MCHC: 32.6 g/dL (ref 30.0–36.0)
MCV: 91.3 fl (ref 78.0–100.0)
MONOS PCT: 7.4 % (ref 3.0–12.0)
Monocytes Absolute: 0.2 10*3/uL (ref 0.1–1.0)
NEUTROS ABS: 1.4 10*3/uL (ref 1.4–7.7)
NEUTROS PCT: 47.4 % (ref 43.0–77.0)
PLATELETS: 90 10*3/uL — AB (ref 150.0–400.0)
RBC: 4.18 Mil/uL — AB (ref 4.22–5.81)
RDW: 13.6 % (ref 11.5–15.5)
WBC: 3.1 10*3/uL — ABNORMAL LOW (ref 4.0–10.5)

## 2017-06-10 ENCOUNTER — Other Ambulatory Visit: Payer: Self-pay | Admitting: Emergency Medicine

## 2017-06-10 DIAGNOSIS — D696 Thrombocytopenia, unspecified: Secondary | ICD-10-CM

## 2017-07-26 ENCOUNTER — Encounter: Payer: Self-pay | Admitting: Internal Medicine

## 2017-09-16 ENCOUNTER — Ambulatory Visit: Payer: BC Managed Care – PPO | Admitting: Internal Medicine

## 2017-09-16 ENCOUNTER — Encounter: Payer: Self-pay | Admitting: Internal Medicine

## 2017-09-16 VITALS — BP 122/78 | HR 67 | Temp 98.3°F | Wt 204.0 lb

## 2017-09-16 DIAGNOSIS — R51 Headache: Secondary | ICD-10-CM

## 2017-09-16 DIAGNOSIS — M542 Cervicalgia: Secondary | ICD-10-CM | POA: Diagnosis not present

## 2017-09-16 DIAGNOSIS — Z23 Encounter for immunization: Secondary | ICD-10-CM | POA: Diagnosis not present

## 2017-09-16 DIAGNOSIS — G4489 Other headache syndrome: Secondary | ICD-10-CM | POA: Diagnosis not present

## 2017-09-16 DIAGNOSIS — R519 Headache, unspecified: Secondary | ICD-10-CM

## 2017-09-16 MED ORDER — MELOXICAM 7.5 MG PO TABS: 7.5000 mg | ORAL_TABLET | Freq: Every day | ORAL | 1 refills | Status: DC

## 2017-09-16 NOTE — Progress Notes (Signed)
Chief Complaint  Patient presents with  . Neck Pain    Pt states that symptoms started while in [place] in September.  Pt states that he has a hard time raising neck when laying flat and also when he bends over to hold his head level is escrutiating. Pt c/o severe headache. Some right should and upper arm pain which shoots up into neck and lower back pain.     HPI: Brad Terry 61 y.o.  sda   To shoulder  And to up to head.     Pain on left rotation and  Pain ful  At night and hard to get up This is had.  Some sneezing   upt to go to bathroom and severe  Neck pain   Shooting .   Was taking tylenol and 500 mg  And  Then excedrin.    Done when he was visiting [place] wonders if it was how he was sleeping however it is been 2 months and it is getting worse.  He does is walking it bothers him but he tries to work through the pain. It is worse at night.  Shooting pain down the right side of his neck into the right side of his head into the shoulder upper chest area without associated weakness of his arm obvious He states he gets some congestion on the right side of his head and right-sided headache.  Wonders if it could be sinus.  He was unable to take naproxen but was able to take Aleve. ROS: See pertinent positives and negatives per HPI.  Past Medical History:  Diagnosis Date  . CHEST PAIN UNSPECIFIED 01/04/2010   Qualifier: Diagnosis of  By: [name], MD, [name]    . Diverticulosis   . Headaches due to old head injury   . Hyperlipidemia   . Internal hemorrhoids   . Renal cyst   . 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 Diabetes Mother   . Diabetes Brother        x 2  . Prostate cancer Brother        x 2  . Colon cancer Neg Hx     Social History   Socioeconomic History  . Marital status: Married    Spouse name: None  . Number of children: None  . Years of education: None   . Highest education level: None  Social Needs  . Financial resource strain: None  . Food insecurity - worry: None  . Food insecurity - inability: None  . Transportation needs - medical: None  . Transportation needs - non-medical: None  Occupational History  . None  Tobacco Use  . Smoking status: Never Smoker  . Smokeless tobacco: Never Used  Substance and Sexual Activity  . Alcohol use: No    Alcohol/week: 0.0 oz  . Drug use: No  . Sexual activity: None  Other Topics Concern  . 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   Tries to walk with exercise      [place] country of origin    No outpatient medications prior to visit.   No facility-administered medications prior to visit.      EXAM:  BP 122/78 (BP Location: Left Arm, Patient Position: Sitting, Cuff Size: Normal)   Pulse 67   Temp 98.3 F (36.8  C) (Oral)   Wt 204 lb (92.5 kg)   BMI 31.02 kg/m   Body mass index is 31.02 kg/m.  GENERAL: vitals reviewed and listed above, alert, oriented, appears well hydrated and in no acute distress has some discomfort with range of motion of his neck but in no acute distress HEENT: atraumatic, conjunctiva  clear, no obvious abnormalities on inspection of external nose and ears OP : no lesion edema or exudate  NECK: no obvious masses on inspection palpation tender at the right trapezius occipital area no rashes noted no mass in the supraclavicular area. Upper extremity strength appears grossly normal and unaffected. DTRs appear to be present in all extremities. LUNGS: clear to auscultation bilaterally, no wheezes, rales or rhonchi, good air movement CV: HRRR, no clubbing cyanosis or  peripheral edema nl cap refill  MS: moves all extremities without noticeable focal  abnormality PSYCH: pleasant and cooperative, Wt Readings from Last 3 Encounters:  09/16/17 204 lb (92.5 kg)  04/29/17 191 lb 3.2 oz (86.7 kg)   12/04/16 198 lb 12.8 oz (90.2 kg)   BP Readings from Last 3 Encounters:  09/16/17 122/78  04/29/17 110/68  12/04/16 120/76     ASSESSMENT AND PLAN:  Discussed the following assessment and plan:  Neck pain on right side - Plan: MR Cervical Spine Wo Contrast  Right-sided headache - Plan: MR Cervical Spine Wo Contrast  Need for influenza vaccination - Plan: Flu Vaccine QUAD 6+ mos PF IM (Fluarix Quad PF)  Other headache syndrome - Plan: MR Cervical Spine Wo Contrast, MR Brain Wo Contrast New onset but persistent severe and more nocturnal with some radiation but not typical.  Could be contributing to right-sided headache. Discussed imaging neck and brain because of this new onset and severity of such. Declined to take prednisone will try meloxicam anti-inflammatory and follow-up depending on scan results and how he was doing. May consider having him see a spine specialist.  If not improving  Okay to get flu vaccine today discussed.  Benefit more than risk of medication -Patient advised to return or notify health care team  if symptoms worsen ,persist or new concerns arise.  Patient Instructions  Plan imaging   Mri   Of  your neck and head   Because of severity of your sx and  Lasting  So long.   Can try Mobic in the interim    Once or twice a day .   May advise seeing a specialist if not getting better.                  [name]. Panosh M.D.

## 2017-09-16 NOTE — Patient Instructions (Addendum)
Plan imaging   Mri   Of  your neck and head   Because of severity of your sx and  Lasting  So long.   Can try Mobic in the interim    Once or twice a day .   May advise seeing a specialist if not getting better.

## 2017-09-19 ENCOUNTER — Telehealth: Payer: Self-pay | Admitting: Internal Medicine

## 2017-09-19 ENCOUNTER — Telehealth: Payer: Self-pay | Admitting: *Deleted

## 2017-09-19 NOTE — Telephone Encounter (Signed)
Initially contacted by pt's wife regarding his unrelieved by meloxicam; his wife Sydell Axon is wondering if he can take tylenol in addition to meloxicam; she is not sure what time he took his last dose or how many times he has taken the medication; prescription directions reviewed with wife; prescription was written for 7.5 mg daily but can take twice a day if needed; this prescription was written on 09/16/17.  Will route to [facility]; they can be reached at [phone]

## 2017-09-19 NOTE — Telephone Encounter (Signed)
See other phone note dated for today. This is a duplicate.

## 2017-09-19 NOTE — Telephone Encounter (Signed)
Copied from [place] [id number]. Topic: Referral - Request >> Sep 19, 2017  9:39 AM Lennox Solders wrote: [facility] can not see patient until 09/29/17. Pt wife is requesting a sooner appt at another facility

## 2017-09-19 NOTE — Telephone Encounter (Signed)
MRI brain and cervical spine ordered and patient is unable to be seen any sooner than 09/29/17 Is this okay or do they need to be worked in sooner somewhere else if possible?

## 2017-09-19 NOTE — Telephone Encounter (Signed)
Pleas make order stat at any place available within his insurance network

## 2017-09-19 NOTE — Telephone Encounter (Signed)
Deborah please advise if able to get patient scheduled ASAP somewhere other than [facility] - they are not able to work patient in STAT. Thanks.

## 2017-09-19 NOTE — Telephone Encounter (Signed)
  Spoke with Hoyle Sauer at [facility] regarding pt's unrelieved pain and if it were possible for him to take tylenol in addition to meloxicam; she will route it to a provider;  Pt's wife Sydell Axon verbalizes understanding and is awaiting a return call from the MD's office

## 2017-09-20 NOTE — Telephone Encounter (Signed)
He may take Meloxicam 7.5 mg bid if he wishes. He can also add Tylenol 500 mg to take 2 tablets (1000 mg) bid for pain

## 2017-09-20 NOTE — Telephone Encounter (Signed)
LM for Brad Terry - verified voicemail, left detailed message for wife with recommendations below. Nothing further needed.

## 2017-09-20 NOTE — Telephone Encounter (Addendum)
Please advise Dr [name] in Dr Velora Mediate absence.  Please advise of recommendations for symptom control. Thanks.

## 2017-09-29 ENCOUNTER — Other Ambulatory Visit: Payer: BC Managed Care – PPO

## 2018-02-25 ENCOUNTER — Ambulatory Visit: Payer: Self-pay

## 2018-02-25 NOTE — Telephone Encounter (Signed)
Patient called in with c/o "tingling." He says "it started about a week ago, tingling to the right side of my head, neck and shoulder. It comes and goes and I have neck pain, like a spasm. It lasts about 15-30 seconds about every few hours or longer." I asked about vision changes, headache, dizziness, weakness, loss of speech/slurred speech, he says "no, none of those." According to protocol, see PCP within 3 days, appointment scheduled for tomorrow at 1015 with Dr. Regis Bill, care advice given, patient verbalized understanding.   Reason for Disposition . [1] Numbness or tingling on both sides of body AND [2] is a new symptom present > 24 hours  Answer Assessment - Initial Assessment Questions 1. SYMPTOM: "What is the main symptom you are concerned about?" (e.g., weakness, numbness)     Tingling 2. ONSET: "When did this start?" (minutes, hours, days; while sleeping)     1 week ago off and on-lasting about 15-30 seconds about every few hours 3. LAST NORMAL: "When was the last time you were normal (no symptoms)?"     1 week ago 4. PATTERN "Does this come and go, or has it been constant since it started?"  "Is it present now?"     Come and go; not present at this time 5. CARDIAC SYMPTOMS: "Have you had any of the following symptoms: chest pain, difficulty breathing, palpitations?"     No 6. NEUROLOGIC SYMPTOMS: "Have you had any of the following symptoms: headache, dizziness, vision loss, double vision, changes in speech, unsteady on your feet?"     Ache to neck area like a muscle spasm 7. OTHER SYMPTOMS: "Do you have any other symptoms?"     No 8. PREGNANCY: "Is there any chance you are pregnant?" "When was your last menstrual period?"    N/A  Protocols used: NEUROLOGIC DEFICIT-A-AH

## 2018-02-25 NOTE — Progress Notes (Signed)
Chief Complaint  Patient presents with  . Numbness    Right arm through the head  . Leg Pain    Right leg pain    HPI: Brad Terry 62 y.o. come in for problem sda   r neck pain   And tingling down  r arm   No ob weakness    Hx of neck pain in past and rx with chiro with some relief  See fall 2018  But now present for over week    Radiates to right post head      When walks      After a while aches   After 8 miles . Tingling down right arm  Tired   Not particlary weak .  Laying right side.  Sometimes   Flares   Also  Rleg at times   Has difficulties   When sitting loing time    Same tingling   Drag foot. 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  Walks 10 miles and above per day .    No chest pain sob   Left pain since    Fall 3 mos ago .  When foot caught root .   ROS: See pertinent positives and negatives per HPI. No fever weight loss sob .   Past Medical History:  Diagnosis Date  . CHEST PAIN UNSPECIFIED 01/04/2010   Qualifier: Diagnosis of  By: [name], MD, [name]    . Diverticulosis   . Headaches due to old head injury   . Hyperlipidemia   . Internal hemorrhoids   . Renal cyst   . 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 Diabetes Mother   . Diabetes Brother        x 2  . Prostate cancer Brother        x 2  . Colon cancer Neg Hx     Social History   Socioeconomic History  . Marital status: Married    Spouse name: Not on file  . Number of children: Not on file  . Years of education: Not on file  . Highest education level: Not on file  Occupational History  . Not on file  Social Needs  . Financial resource strain: Not on file  . Food insecurity:    Worry: Not on file    Inability: Not on file  . Transportation needs:    Medical: Not on file    Non-medical: Not on file  Tobacco Use  . Smoking status: Never Smoker  . Smokeless  tobacco: Never Used  Substance and Sexual Activity  . Alcohol use: No    Alcohol/week: 0.0 oz  . Drug use: No  . Sexual activity: Not on file  Lifestyle  . Physical activity:    Days per week: Not on file    Minutes per session: Not on file  . Stress: Not on file  Relationships  . Social connections:    Talks on phone: Not on file    Gets together: Not on file    Attends religious service: Not on file    Active member of club or organization: Not on file    Attends meetings of clubs or organizations: Not on file    Relationship status: Not on file  Other Topics Concern  . 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   Tries to walk with exercise      [place] country of origin    Outpatient Medications Prior to Visit  Medication Sig Dispense Refill  . meloxicam (MOBIC) 7.5 MG tablet Take 1 tablet (7.5 mg total) daily by mouth. Can take twice a day if needed 30 tablet 1   No facility-administered medications prior to visit.      EXAM:  BP 112/80 (BP Location: Left Arm, Patient Position: Sitting, Cuff Size: Normal)   Pulse (!) 59   Temp 98.6 F (37 C) (Oral)   Ht 5\' 8"  (1.727 m)   Wt 205 lb 12.8 oz (93.4 kg)   SpO2 97%   BMI 31.29 kg/m   Body mass index is 31.29 kg/m.  GENERAL: vitals reviewed and listed above, alert, oriented, appears well hydrated and in no acute distress HEENT: atraumatic, conjunctiva  clear, no obvious abnormalities on inspection of external nose and ears NECK: no obvious masses on inspection palpation   Tender  righ lateral LUNGS: clear to auscultation bilaterally, no wheezes, rales or rhonchi, good air movement CV: HRRR, no clubbing cyanosis or  peripheral edema nl cap refill  MS: moves all extremities without noticeable focal  Abnormality Neuro non focal dtrs   present all 4 extremity   No obv weakness of atrophy  No masses neck cn 3-12 seem intact   PSYCH: pleasant and  cooperative, no obvious depression or anxiety  BP Readings from Last 3 Encounters:  02/26/18 112/80  09/16/17 122/78  04/29/17 110/68    ASSESSMENT AND PLAN:  Discussed the following assessment and plan:  Right cervical radiculopathy - Plan: MR Brain Wo Contrast, MR Cervical Spine Wo Contrast  Neck pain on right side - Plan: MR Brain Wo Contrast, MR Cervical Spine Wo Contrast  Numbness and tingling of right arm - Plan: MR Brain Wo Contrast, MR Cervical Spine Wo Contrast  Numbness and tingling of right leg - appears to be separate from neck arm ?  - Plan: MR Brain Wo Contrast, MR Cervical Spine Wo Contrast  Right-sided headache - ? cericogenic ?  based on hx  and exam? - Plan: MR Brain Wo Contrast, MR Cervical Spine Wo Contrast  Nonintractable episodic headache, unspecified headache type - Plan: MR Brain Wo Contrast Never got the mri from the fall   choiro helped some  But   Now more sx    Will re order  -Patient advised to return or notify health care team  if  new concerns arise.  Patient Instructions  Sounds like a pinched nerve  And will arrange   Mri of head and neck and then plan follow up.  Doesn't sound vascular.   consdier sports medicine and or  Neurosurgical specialty to evaluate    Cervical Radiculopathy Cervical radiculopathy happens when a nerve in the neck (cervical nerve) is pinched or bruised. This condition can develop because of an injury or as part of the normal aging process. Pressure on the cervical nerves can cause pain or numbness that runs from the neck all the way down into the arm and fingers. Usually, this condition gets better with rest. Treatment may be needed if the condition does not improve. What are the causes? This condition may be caused by:  Injury.  Slipped (herniated) disk.  Muscle tightness in the neck because of overuse.  Arthritis.  Breakdown or degeneration in the bones  and joints of the spine (spondylosis) due to aging.  Bone  spurs that may develop near the cervical nerves.  What are the signs or symptoms? Symptoms of this condition include:  Pain that runs from the neck to the arm and hand. The pain can be severe or irritating. It may be worse when the neck is moved.  Numbness or weakness in the affected arm and hand.  How is this diagnosed? This condition may be diagnosed based on symptoms, medical history, and a physical exam. You may also have tests, including:  X-rays.  CT scan.  MRI.  Electromyogram (EMG).  Nerve conduction tests.  How is this treated? In many cases, treatment is not needed for this condition. With rest, the condition usually gets better over time. If treatment is needed, options may include:  Wearing a soft neck collar for short periods of time.  Physical therapy to strengthen your neck muscles.  Medicines, such as NSAIDs, oral corticosteroids, or spinal injections.  Surgery. This may be needed if other treatments do not help. Various types of surgery may be done depending on the cause of your problems.  Follow these instructions at home: Managing pain  Take over-the-counter and prescription medicines only as told by your health care provider.  If directed, apply ice to the affected area. ? Put ice in a plastic bag. ? Place a towel between your skin and the bag. ? Leave the ice on for 20 minutes, 2-3 times per day.  If ice does not help, you can try using heat. Take a warm shower or warm bath, or use a heat pack as told by your health care provider.  Try a gentle neck and shoulder massage to help relieve symptoms. Activity  Rest as needed. Follow instructions from your health care provider about any restrictions on activities.  Do stretching and strengthening exercises as told by your health care provider or physical therapist. General instructions  If you were given a soft collar, wear it as told by your health care provider.  Use a flat pillow when you  sleep.  Keep all follow-up visits as told by your health care provider. This is important. Contact a health care provider if:  Your condition does not improve with treatment. Get help right away if:  Your pain gets much worse and cannot be controlled with medicines.  You have weakness or numbness in your hand, arm, face, or leg.  You have a high fever.  You have a stiff, rigid neck.  You lose control of your bowels or your bladder (have incontinence).  You have trouble with walking, balance, or speaking. This information is not intended to replace advice given to you by your health care provider. Make sure you discuss any questions you have with your health care provider. Document Released: 07/17/2001 Document Revised: 03/29/2016 Document Reviewed: 12/16/2014 Elsevier Interactive Patient Education  2018 [place]. Jonquil Stubbe M.D.

## 2018-02-26 ENCOUNTER — Ambulatory Visit: Payer: BC Managed Care – PPO | Admitting: Internal Medicine

## 2018-02-26 ENCOUNTER — Encounter: Payer: Self-pay | Admitting: Internal Medicine

## 2018-02-26 VITALS — BP 112/80 | HR 59 | Temp 98.6°F | Ht 68.0 in | Wt 205.8 lb

## 2018-02-26 DIAGNOSIS — R202 Paresthesia of skin: Secondary | ICD-10-CM | POA: Diagnosis not present

## 2018-02-26 DIAGNOSIS — R51 Headache: Secondary | ICD-10-CM

## 2018-02-26 DIAGNOSIS — R519 Headache, unspecified: Secondary | ICD-10-CM

## 2018-02-26 DIAGNOSIS — M542 Cervicalgia: Secondary | ICD-10-CM | POA: Diagnosis not present

## 2018-02-26 DIAGNOSIS — R200 Anesthesia of skin: Secondary | ICD-10-CM

## 2018-02-26 DIAGNOSIS — M5412 Radiculopathy, cervical region: Secondary | ICD-10-CM

## 2018-02-26 NOTE — Patient Instructions (Addendum)
Sounds like a pinched nerve  And will arrange   Mri of head and neck and then plan follow up.  Doesn't sound vascular.   consdier sports medicine and or  Neurosurgical specialty to evaluate    Cervical Radiculopathy Cervical radiculopathy happens when a nerve in the neck (cervical nerve) is pinched or bruised. This condition can develop because of an injury or as part of the normal aging process. Pressure on the cervical nerves can cause pain or numbness that runs from the neck all the way down into the arm and fingers. Usually, this condition gets better with rest. Treatment may be needed if the condition does not improve. What are the causes? This condition may be caused by:  Injury.  Slipped (herniated) disk.  Muscle tightness in the neck because of overuse.  Arthritis.  Breakdown or degeneration in the bones and joints of the spine (spondylosis) due to aging.  Bone spurs that may develop near the cervical nerves.  What are the signs or symptoms? Symptoms of this condition include:  Pain that runs from the neck to the arm and hand. The pain can be severe or irritating. It may be worse when the neck is moved.  Numbness or weakness in the affected arm and hand.  How is this diagnosed? This condition may be diagnosed based on symptoms, medical history, and a physical exam. You may also have tests, including:  X-rays.  CT scan.  MRI.  Electromyogram (EMG).  Nerve conduction tests.  How is this treated? In many cases, treatment is not needed for this condition. With rest, the condition usually gets better over time. If treatment is needed, options may include:  Wearing a soft neck collar for short periods of time.  Physical therapy to strengthen your neck muscles.  Medicines, such as NSAIDs, oral corticosteroids, or spinal injections.  Surgery. This may be needed if other treatments do not help. Various types of surgery may be done depending on the cause of your  problems.  Follow these instructions at home: Managing pain  Take over-the-counter and prescription medicines only as told by your health care provider.  If directed, apply ice to the affected area. ? Put ice in a plastic bag. ? Place a towel between your skin and the bag. ? Leave the ice on for 20 minutes, 2-3 times per day.  If ice does not help, you can try using heat. Take a warm shower or warm bath, or use a heat pack as told by your health care provider.  Try a gentle neck and shoulder massage to help relieve symptoms. Activity  Rest as needed. Follow instructions from your health care provider about any restrictions on activities.  Do stretching and strengthening exercises as told by your health care provider or physical therapist. General instructions  If you were given a soft collar, wear it as told by your health care provider.  Use a flat pillow when you sleep.  Keep all follow-up visits as told by your health care provider. This is important. Contact a health care provider if:  Your condition does not improve with treatment. Get help right away if:  Your pain gets much worse and cannot be controlled with medicines.  You have weakness or numbness in your hand, arm, face, or leg.  You have a high fever.  You have a stiff, rigid neck.  You lose control of your bowels or your bladder (have incontinence).  You have trouble with walking, balance, or speaking. This information  is not intended to replace advice given to you by your health care provider. Make sure you discuss any questions you have with your health care provider. Document Released: 07/17/2001 Document Revised: 03/29/2016 Document Reviewed: 12/16/2014 Elsevier Interactive Patient Education  [employer].

## 2018-03-11 ENCOUNTER — Inpatient Hospital Stay: Admission: RE | Admit: 2018-03-11 | Payer: BC Managed Care – PPO | Source: Ambulatory Visit

## 2018-12-05 ENCOUNTER — Encounter: Payer: Self-pay | Admitting: Family Medicine

## 2018-12-05 ENCOUNTER — Other Ambulatory Visit: Payer: Self-pay

## 2018-12-05 ENCOUNTER — Ambulatory Visit: Payer: BC Managed Care – PPO | Admitting: Family Medicine

## 2018-12-05 VITALS — BP 120/64 | HR 64 | Temp 98.3°F | Ht 68.0 in | Wt 212.6 lb

## 2018-12-05 DIAGNOSIS — L509 Urticaria, unspecified: Secondary | ICD-10-CM | POA: Diagnosis not present

## 2018-12-05 MED ORDER — MOMETASONE FUROATE 0.1 % EX SOLN: Freq: Every day | CUTANEOUS | 1 refills | Status: DC

## 2018-12-05 NOTE — Patient Instructions (Signed)
Hives Hives (urticaria) are itchy, red, swollen areas on the skin. Hives can appear on any part of the body. Hives often fade within 24 hours (acute hives). Sometimes, new hives appear after old ones fade and the cycle can continue for several days or weeks (chronic hives). Hives do not spread from person to person (are not contagious). Hives come from the body's reaction to something a person is allergic to (allergen), something that causes irritation, or various other triggers. When a person is exposed to a trigger, his or her body releases a chemical (histamine) that causes redness, itching, and swelling. Hives can appear right after exposure to a trigger or hours later. What are the causes? This condition may be caused by:  Allergies to foods or ingredients.  Insect bites or stings.  Exposure to pollen or pets.  Contact with latex or chemicals.  Spending time in sunlight, heat, or cold (exposure).  Exercise.  Stress.  Certain medicines. You can also get hives from other medical conditions and treatments, such as:  Viruses, including the common cold.  Bacterial infections, such as urinary tract infections and strep throat.  Certain medicines.  Allergy shots.  Blood transfusions. Sometimes, the cause of this condition is not known (idiopathic hives). What increases the risk? You are more likely to develop this condition if you:  Are a woman.  Have food allergies, especially to citrus fruits, milk, eggs, peanuts, tree nuts, or shellfish.  Are allergic to: ? Medicines. ? Latex. ? Insects. ? Animals. ? Pollen. What are the signs or symptoms? Common symptoms of this condition include raised, itchy, red or white bumps or patches on your skin. These areas may:  Become large and swollen (welts).  Change in shape and location, quickly and repeatedly.  Be separate hives or connect over a large area of skin.  Sting or become painful.  Turn white when pressed in the  center (blanch). In severe cases, yourhands, feet, and face may also become swollen. This may occur if hives develop deeper in your skin. How is this diagnosed? This condition may be diagnosed by your symptoms, medical history, and physical exam.  Your skin, urine, or blood may be tested to find out what is causing your hives and to rule out other health issues.  Your health care provider may also remove a small sample of skin from the affected area and examine it under a microscope (biopsy). How is this treated? Treatment for this condition depends on the cause and severity of your symptoms. Your health care provider may recommend using cool, wet cloths (cool compresses) or taking cool showers to relieve itching. Treatment may include:  Medicines that help: ? Relieve itching (antihistamines). ? Reduce swelling (corticosteroids). ? Treat infection (antibiotics).  An injectable medicine (omalizumab). Your health care provider may prescribe this if you have chronic idiopathic hives and you continue to have symptoms even after treatment with antihistamines. Severe cases may require an emergency injection of adrenaline (epinephrine) to prevent a life-threatening allergic reaction (anaphylaxis). Follow these instructions at home: Medicines  Take and apply over-the-counter and prescription medicines only as told by your health care provider.  If you were prescribed an antibiotic medicine, take it as told by your health care provider. Do not stop using the antibiotic even if you start to feel better. Skin care  Apply cool compresses to the affected areas.  Do not scratch or rub your skin. General instructions  Do not take hot showers or baths. This can make itching   worse.  Do not wear tight-fitting clothing.  Use sunscreen and wear protective clothing when you are outside.  Avoid any substances that cause your hives. Keep a journal to help track what causes your hives. Write  down: ? What medicines you take. ? What you eat and drink. ? What products you use on your skin.  Keep all follow-up visits as told by your health care provider. This is important. Contact a health care provider if:  Your symptoms are not controlled with medicine.  Your joints are painful or swollen. Get help right away if:  You have a fever.  You have pain in your abdomen.  Your tongue or lips are swollen.  Your eyelids are swollen.  Your chest or throat feels tight.  You have trouble breathing or swallowing. These symptoms may represent a serious problem that is an emergency. Do not wait to see if the symptoms will go away. Get medical help right away. Call your local emergency services (911 in the [place] Do not drive yourself to the hospital. Summary  Hives (urticaria) are itchy, red, swollen areas on your skin. Hives come from the body's reaction to something a person is allergic to (allergen), something that causes irritation, or various other triggers.  Treatment for this condition depends on the cause and severity of your symptoms.  Avoid any substances that cause your hives. Keep a journal to help track what causes your hives.  Take and apply over-the-counter and prescription medicines only as told by your health care provider.  Keep all follow-up visits as told by your health care provider. This is important. This information is not intended to replace advice given to you by your health care provider. Make sure you discuss any questions you have with your health care provider. Document Released: 10/22/2005 Document Revised: 05/07/2018 Document Reviewed: 05/07/2018 Elsevier Interactive Patient Education  2019 [employer].  Consider daily use of anti-histamine such as Allegra, Zyrtec, Claritan, or XYZAL Also consider Pepcid 20 mg twice daily

## 2018-12-05 NOTE — Progress Notes (Signed)
  Subjective:     Patient ID: Brad Terry, male   DOB: 07-May-1956, 63 y.o.   MRN: [id number]  HPI Patient is seen with recurrent skin rash for the past few weeks.  He brings in a picture on his phone and these look like classic urticaria.  These are elevated and somewhat elongated and can last for couple hours then fade.  Nonscaly.  Patient has been walking a lot recently- sometimes up to 20 miles per day and frequently wears longjohns.  His hives have occurred sometimes with exercise but not consistently.  There is no consistent trigger that he can see.  He uses [employer] which he has used for years.  No new medications.  He tried some hydrocortisone cream and occasional Benadryl without much difference.  Does have prior history of urticaria.  He does not describe any angioedema symptoms.  Past Medical History:  Diagnosis Date  . CHEST PAIN UNSPECIFIED 01/04/2010   Qualifier: Diagnosis of  By: [name], MD, [name]    . Diverticulosis   . Headaches due to old head injury   . Hyperlipidemia   . Internal hemorrhoids   . Renal cyst   . URTICARIA, ALLERGIC 12/30/2007   Qualifier: Diagnosis of  By: [name] MD, [name]    Past Surgical History:  Procedure Laterality Date  . KNEE ARTHROSCOPY     right    reports that he has never smoked. He has never used smokeless tobacco. He reports that he does not drink alcohol or use drugs. family history includes Diabetes in his brother and mother; Prostate cancer in his brother. Allergies  Allergen Reactions  . Naproxen     Able to take aleve  . Piroxicam     REACTION: tinnitus  . Statins     Hx of se of 2 statins      Review of Systems  Constitutional: Negative for appetite change, chills, fever and unexpected weight change.  Respiratory: Negative for cough and shortness of breath.   Cardiovascular: Negative for chest pain.  Skin: Positive for rash.  Hematological: Negative for adenopathy.       Objective:   Physical  Exam Constitutional:      Appearance: Normal appearance.  Cardiovascular:     Rate and Rhythm: Normal rate and regular rhythm.  Pulmonary:     Effort: Pulmonary effort is normal.     Breath sounds: Normal breath sounds.  Skin:    Comments: No rash noted at this time  Neurological:     Mental Status: He is alert.        Assessment:     Recurrent urticarial type lesions.  Etiology unclear.  No angioedema.   he walks a lot outdoors but these have not consistently occurred with cold air exposure.  He does wonder if wearing longjohns recently may be a trigger    Plan:     -We recommend he consider over-the-counter nonsedating antihistamine for daytime use and may try Pepcid 20 mg twice daily concomitantly. -We will try leaving off the longjohns for now -Follow-up with primary for any persistent or worsening urticarial lesions  Eulas Post MD [facility]

## 2019-01-06 NOTE — Progress Notes (Deleted)
No chief complaint on file.   HPI: Brad Terry 64 y.o. come in for  ROS: See pertinent positives and negatives per HPI.  Past Medical History:  Diagnosis Date  . CHEST PAIN UNSPECIFIED 01/04/2010   Qualifier: Diagnosis of  By: [name], MD, [name]    . Diverticulosis   . Headaches due to old head injury   . Hyperlipidemia   . Internal hemorrhoids   . Renal cyst   . 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 Diabetes Mother   . Diabetes Brother        x 2  . Prostate cancer Brother        x 2  . Colon cancer Neg Hx     Social History   Socioeconomic History  . Marital status: Married    Spouse name: Not on file  . Number of children: Not on file  . Years of education: Not on file  . Highest education level: Not on file  Occupational History  . Not on file  Social Needs  . Financial resource strain: Not on file  . Food insecurity:    Worry: Not on file    Inability: Not on file  . Transportation needs:    Medical: Not on file    Non-medical: Not on file  Tobacco Use  . Smoking status: Never Smoker  . Smokeless tobacco: Never Used  Substance and Sexual Activity  . Alcohol use: No    Alcohol/week: 0.0 standard drinks  . Drug use: No  . Sexual activity: Not on file  Lifestyle  . Physical activity:    Days per week: Not on file    Minutes per session: Not on file  . Stress: Not on file  Relationships  . Social connections:    Talks on phone: Not on file    Gets together: Not on file    Attends religious service: Not on file    Active member of club or organization: Not on file    Attends meetings of clubs or organizations: Not on file    Relationship status: Not on file  Other Topics Concern  . 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   Tries to walk with exercise      [place]  [place]    Outpatient Medications Prior to Visit  Medication Sig Dispense Refill  . meloxicam (MOBIC) 7.5 MG tablet Take 1 tablet (7.5 mg total) daily by mouth. Can take twice a day if needed (Patient not taking: Reported on 12/05/2018) 30 tablet 1  . mometasone (ELOCON) 0.1 % lotion Apply topically daily. 60 mL 1   No facility-administered medications prior to visit.      EXAM:  There were no vitals taken for this visit.  There is no height or weight on file to calculate BMI.  GENERAL: vitals reviewed and listed above, alert, oriented, appears well hydrated and in no acute distress HEENT: atraumatic, conjunctiva  clear, no obvious abnormalities on inspection of external nose and ears OP : no lesion edema or exudate  NECK: no obvious masses on inspection palpation  LUNGS: clear to auscultation bilaterally, no wheezes, rales or rhonchi, good air movement CV: HRRR, no clubbing cyanosis or  peripheral edema nl cap refill  MS: moves all extremities without  noticeable focal  abnormality PSYCH: pleasant and cooperative, no obvious depression or anxiety Lab Results  Component Value Date   WBC 3.1 (L) 06/04/2017   HGB 12.4 (L) 06/04/2017   HCT 38.1 (L) 06/04/2017   PLT 90.0 (L) 06/04/2017   GLUCOSE 77 04/29/2017   CHOL 187 04/29/2017   TRIG 37.0 04/29/2017   HDL 54.60 04/29/2017   LDLDIRECT 140.2 07/31/2012   LDLCALC 125 (H) 04/29/2017   ALT 35 04/29/2017   AST 24 04/29/2017   NA 140 04/29/2017   K 4.1 04/29/2017   CL 105 04/29/2017   CREATININE 0.94 04/29/2017   BUN 14 04/29/2017   CO2 31 04/29/2017   TSH 2.56 10/10/2015   PSA 0.79 04/29/2017   HGBA1C 5.8 04/29/2017   BP Readings from Last 3 Encounters:  12/05/18 120/64  02/26/18 112/80  09/16/17 122/78    ASSESSMENT AND PLAN:  Discussed the following assessment and plan:  No diagnosis found.  -Patient advised to return or notify health care team  if  new concerns arise.  There are no Patient  Instructions on file for this visit.   [name]. Panosh M.D.

## 2019-01-07 ENCOUNTER — Ambulatory Visit: Payer: BC Managed Care – PPO | Admitting: Internal Medicine

## 2019-01-07 ENCOUNTER — Ambulatory Visit (INDEPENDENT_AMBULATORY_CARE_PROVIDER_SITE_OTHER): Payer: BC Managed Care – PPO | Admitting: Internal Medicine

## 2019-01-07 ENCOUNTER — Encounter: Payer: Self-pay | Admitting: Internal Medicine

## 2019-01-07 VITALS — BP 114/66 | HR 66 | Temp 98.3°F | Wt 213.6 lb

## 2019-01-07 DIAGNOSIS — R1310 Dysphagia, unspecified: Secondary | ICD-10-CM

## 2019-01-07 DIAGNOSIS — Z872 Personal history of diseases of the skin and subcutaneous tissue: Secondary | ICD-10-CM | POA: Diagnosis not present

## 2019-01-07 NOTE — Progress Notes (Signed)
Chief Complaint  Patient presents with  . belching    Pt states that he has been burping and has trouble swallowing. Pt states that when he tries to take a pill he feels a pain in his chest .pt states this has been going on for about 3 weeks to a month     HPI: Brad Terry 63 y.o. come in for problem evaluation  Has had about 3 weeks or so of  Mid upper chest  Pressure when eating solids  Like getting stuck   Feels likje narrowness  Constriction and feels like food slows  down  There  Solids more than liquids ocass.  No vomiting  Not worse at night  Got  At pharmacy  nexium and taking  Once a day  For the past   2 day.  Was taking  multivit and had painful  Swallowing  Uncertain if  Predated the current sx   Pill pain  About 2 weeks ago . And now break the pill .   ( otc multivit)  Not currently taking  nsaids or mobic ROS: See pertinent positives and negatives per HPI. No cpsb has had hives seen in jan uncertain cause today soe itchy red areas lateral flank  No  Specific triggers  Stopped  Pedicure in case had itching feet.  No unintentional weight loss Still walking  No cp sob with his exercise  Past Medical History:  Diagnosis Date  . CHEST PAIN UNSPECIFIED 01/04/2010   Qualifier: Diagnosis of  By: [name], MD, [name]    . Diverticulosis   . Headaches due to old head injury   . Hyperlipidemia   . Internal hemorrhoids   . Renal cyst   . 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 Diabetes Mother   . Diabetes Brother        x 2  . Prostate cancer Brother        x 2  . Colon cancer Neg Hx     Social History   Socioeconomic History  . Marital status: Married    Spouse name: Not on file  . Number of children: Not on file  . Years of education: Not on file  . Highest education level: Not on file  Occupational History  . Not on file  Social Needs  . Financial  resource strain: Not on file  . Food insecurity:    Worry: Not on file    Inability: Not on file  . Transportation needs:    Medical: Not on file    Non-medical: Not on file  Tobacco Use  . Smoking status: Never Smoker  . Smokeless tobacco: Never Used  Substance and Sexual Activity  . Alcohol use: No    Alcohol/week: 0.0 standard drinks  . Drug use: No  . Sexual activity: Not on file  Lifestyle  . Physical activity:    Days per week: Not on file    Minutes per session: Not on file  . Stress: Not on file  Relationships  . Social connections:    Talks on phone: Not on file    Gets together: Not on file    Attends religious service: Not on file    Active member of club or organization: Not on file    Attends meetings of clubs or organizations: Not on file    Relationship status: Not  on file  Other Topics Concern  . 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   Tries to walk with exercise      [place] country of origin    Outpatient Medications Prior to Visit  Medication Sig Dispense Refill  . mometasone (ELOCON) 0.1 % lotion Apply topically daily. 60 mL 1  . meloxicam (MOBIC) 7.5 MG tablet Take 1 tablet (7.5 mg total) daily by mouth. Can take twice a day if needed (Patient not taking: Reported on 12/05/2018) 30 tablet 1   No facility-administered medications prior to visit.      EXAM:  BP 114/66 (BP Location: Right Arm, Patient Position: Sitting, Cuff Size: Large)   Pulse 66   Temp 98.3 F (36.8 C) (Oral)   Wt 213 lb 9.6 oz (96.9 kg)   BMI 32.48 kg/m   Body mass index is 32.48 kg/m.  GENERAL: vitals reviewed and listed above, alert, oriented, appears well hydrated and in no acute distress HEENT: atraumatic, conjunctiva  clear, no obvious abnormalities on inspection of external nose and ears OP : no lesion edema or exudate  Goo d  airway  NECK: no obvious masses on inspection palpation  LUNGS:  clear to auscultation bilaterally, no wheezes, rales or rhonchi, good air movement CV: HRRR, no clubbing cyanosis or  peripheral edema nl cap refill  Abdomen:  Sof,t normal bowel sounds without hepatosplenomegaly, no guarding rebound or masses no CVA tenderness  Skin faint red blotchiness left side abd wall no lesions no anicteric.  MS: moves all extremities without noticeable focal  abnormality PSYCH: pleasant and cooperative, no obvious depression or anxiety  BP Readings from Last 3 Encounters:  01/07/19 114/66  12/05/18 120/64  02/26/18 112/80    ASSESSMENT AND PLAN:  Discussed the following assessment and plan:  Dysphagia, unspecified type - Plan: DG ESOPHAGUS W DOUBLE CM (HD), Ambulatory referral to Gastroenterology  History of urticaria recurrent Hx of hives uncertain cause  At this time continue the nexium  Proceed with esophagram and  Gi consults    Hx of gerd 2011 endo neg for h pylori   R/o obstruction  Etc .   Fu earlier if worse    Expectant management.  -Patient advised to return or notify health care team  if  new concerns arise.  Patient Instructions  You will be contacted about getting an esophagram ans swallowing  X ray  And referral to the GI specialist.   Stay  On the nexium .   Once a day  Best 30 - 60 minutes before eating .   This acts like an esophageal problem and sometimes  Is inflammation want to make sure no  Blockage .  Sometimes  Just slow muscle    Peristalsis    Dysphagia  Dysphagia is trouble swallowing. This condition occurs when solids and liquids stick in a person's throat on the way down to the stomach, or when food takes longer to get to the stomach. You may have problems swallowing food, liquids, or both. You may also have pain while trying to swallow. It may take you more time and effort to swallow something. What are the causes? This condition is caused by:  Problems with the muscles. They may make it difficult for you to move food and  liquids through the tube that connects your mouth to your stomach (esophagus). You may have ulcers, scar tissue, or inflammation that blocks  the normal passage of food and liquids. Causes of these problems include: ? Acid reflux from your stomach into your esophagus (gastroesophageal reflux). ? Infections. ? Radiation treatment for cancer. ? Medicines taken without enough fluids to wash them down into your stomach.  Nerve problems. These prevent signals from being sent to the muscles of your esophagus to squeeze (contract) and move what you swallow down to your stomach.  Globus pharyngeus. This is a common problem that involves feeling like something is stuck in the throat or a sense of trouble with swallowing even though nothing is wrong with the swallowing passages.  Stroke. This can affect the nerves and make it difficult to swallow.  Certain conditions, such as cerebral palsy or Parkinson disease. What are the signs or symptoms? Common symptoms of this condition include:  A feeling that solids or liquids are stuck in your throat on the way down to the stomach.  Food taking too long to get to the stomach. Other symptoms include:  Food moving back from your stomach to your mouth (regurgitation).  Noises coming from your throat.  Chest discomfort with swallowing.  A feeling of fullness when swallowing.  Drooling, especially when the throat is blocked.  Pain while swallowing.  Heartburn.  Coughing or gagging while trying to swallow. How is this diagnosed? This condition is diagnosed by:  Barium X-ray. In this test, you swallow a white substance (contrast medium)that sticks to the inside of your esophagus. X-ray images are then taken.  Endoscopy. In this test, a flexible telescope is inserted down your throat to look at your esophagus and your stomach.  CT scans and MRI. How is this treated? Treatment for dysphagia depends on the cause of the condition:  If the dysphagia  is caused by acid reflux or infection, medicines may be used. They may include antibiotics and heartburn medicines.  If the dysphagia is caused by problems with your muscles, swallowing therapy may be used to help you strengthen your swallowing muscles. You may have to do specific exercises to strengthen the muscles or stretch them.  If the dysphagia is caused by a blockage or mass, procedures to remove the blockage may be done. You may need surgery and a feeding tube. You may need to make diet changes. Ask your health care provider for specific instructions. Follow these instructions at home: Eating and drinking  Try to eat soft food that is easier to swallow.  Follow any diet changes as told by your health care provider.  Cut your food into small pieces and eat slowly.  Eat and drink only when you are sitting upright.  Do not drink alcohol or caffeine. If you need help quitting, ask your health care provider. General instructions  Check your weight every day to make sure you are not losing weight.  Take over-the-counter and prescription medicines only as told by your health care provider.  If you were prescribed an antibiotic medicine, take it as told by your health care provider. Do not stop taking the antibiotic even if you start to feel better.  Do not use any products that contain nicotine or tobacco, such as cigarettes and e-cigarettes. If you need help quitting, ask your health care provider.  Keep all follow-up visits as told by your health care provider. This is important. Contact a health care provider if:  You lose weight because you cannot swallow.  You cough when you drink liquids (aspiration).  You cough up partially digested food. Get help right away  if:  You cannot swallow your saliva.  You have shortness of breath or a fever, or both.  You have a hoarse voice and also have trouble swallowing. Summary  Dysphagia is trouble swallowing. This condition  occurs when solids and liquids stick in a person's throat on the way down to the stomach, or when food takes longer to get to the stomach.  Dysphagia has many possible causes and symptoms.  Treatment for dysphagia depends on the cause of the condition. This information is not intended to replace advice given to you by your health care provider. Make sure you discuss any questions you have with your health care provider. Document Released: 10/19/2000 Document Revised: 10/11/2016 Document Reviewed: 10/11/2016 Elsevier Interactive Patient Education  2019 [place] [employer]. Damica Gravlin M.D.

## 2019-01-07 NOTE — Patient Instructions (Signed)
You will be contacted about getting an esophagram ans swallowing  X ray  And referral to the GI specialist.   Stay  On the nexium .   Once a day  Best 30 - 60 minutes before eating .   This acts like an esophageal problem and sometimes  Is inflammation want to make sure no  Blockage .  Sometimes  Just slow muscle    Peristalsis    Dysphagia  Dysphagia is trouble swallowing. This condition occurs when solids and liquids stick in a person's throat on the way down to the stomach, or when food takes longer to get to the stomach. You may have problems swallowing food, liquids, or both. You may also have pain while trying to swallow. It may take you more time and effort to swallow something. What are the causes? This condition is caused by:  Problems with the muscles. They may make it difficult for you to move food and liquids through the tube that connects your mouth to your stomach (esophagus). You may have ulcers, scar tissue, or inflammation that blocks the normal passage of food and liquids. Causes of these problems include: ? Acid reflux from your stomach into your esophagus (gastroesophageal reflux). ? Infections. ? Radiation treatment for cancer. ? Medicines taken without enough fluids to wash them down into your stomach.  Nerve problems. These prevent signals from being sent to the muscles of your esophagus to squeeze (contract) and move what you swallow down to your stomach.  Globus pharyngeus. This is a common problem that involves feeling like something is stuck in the throat or a sense of trouble with swallowing even though nothing is wrong with the swallowing passages.  Stroke. This can affect the nerves and make it difficult to swallow.  Certain conditions, such as cerebral palsy or Parkinson disease. What are the signs or symptoms? Common symptoms of this condition include:  A feeling that solids or liquids are stuck in your throat on the way down to the stomach.  Food  taking too long to get to the stomach. Other symptoms include:  Food moving back from your stomach to your mouth (regurgitation).  Noises coming from your throat.  Chest discomfort with swallowing.  A feeling of fullness when swallowing.  Drooling, especially when the throat is blocked.  Pain while swallowing.  Heartburn.  Coughing or gagging while trying to swallow. How is this diagnosed? This condition is diagnosed by:  Barium X-ray. In this test, you swallow a white substance (contrast medium)that sticks to the inside of your esophagus. X-ray images are then taken.  Endoscopy. In this test, a flexible telescope is inserted down your throat to look at your esophagus and your stomach.  CT scans and MRI. How is this treated? Treatment for dysphagia depends on the cause of the condition:  If the dysphagia is caused by acid reflux or infection, medicines may be used. They may include antibiotics and heartburn medicines.  If the dysphagia is caused by problems with your muscles, swallowing therapy may be used to help you strengthen your swallowing muscles. You may have to do specific exercises to strengthen the muscles or stretch them.  If the dysphagia is caused by a blockage or mass, procedures to remove the blockage may be done. You may need surgery and a feeding tube. You may need to make diet changes. Ask your health care provider for specific instructions. Follow these instructions at home: Eating and drinking  Try to eat soft food that  is easier to swallow.  Follow any diet changes as told by your health care provider.  Cut your food into small pieces and eat slowly.  Eat and drink only when you are sitting upright.  Do not drink alcohol or caffeine. If you need help quitting, ask your health care provider. General instructions  Check your weight every day to make sure you are not losing weight.  Take over-the-counter and prescription medicines only as told by  your health care provider.  If you were prescribed an antibiotic medicine, take it as told by your health care provider. Do not stop taking the antibiotic even if you start to feel better.  Do not use any products that contain nicotine or tobacco, such as cigarettes and e-cigarettes. If you need help quitting, ask your health care provider.  Keep all follow-up visits as told by your health care provider. This is important. Contact a health care provider if:  You lose weight because you cannot swallow.  You cough when you drink liquids (aspiration).  You cough up partially digested food. Get help right away if:  You cannot swallow your saliva.  You have shortness of breath or a fever, or both.  You have a hoarse voice and also have trouble swallowing. Summary  Dysphagia is trouble swallowing. This condition occurs when solids and liquids stick in a person's throat on the way down to the stomach, or when food takes longer to get to the stomach.  Dysphagia has many possible causes and symptoms.  Treatment for dysphagia depends on the cause of the condition. This information is not intended to replace advice given to you by your health care provider. Make sure you discuss any questions you have with your health care provider. Document Released: 10/19/2000 Document Revised: 10/11/2016 Document Reviewed: 10/11/2016 Elsevier Interactive Patient Education  2019 [employer].

## 2019-01-15 ENCOUNTER — Other Ambulatory Visit: Payer: Self-pay

## 2019-01-15 ENCOUNTER — Ambulatory Visit
Admission: RE | Admit: 2019-01-15 | Discharge: 2019-01-15 | Disposition: A | Discharge status: Home / Self Care | Payer: BC Managed Care – PPO | Source: Ambulatory Visit | Attending: Internal Medicine | Admitting: Internal Medicine

## 2019-01-15 DIAGNOSIS — R1310 Dysphagia, unspecified: Secondary | ICD-10-CM

## 2019-02-09 ENCOUNTER — Telehealth: Payer: Self-pay

## 2019-02-09 NOTE — Telephone Encounter (Signed)
Pt has been notified.

## 2019-02-09 NOTE — Telephone Encounter (Signed)
Copied from [place] [id number]. Topic: Quick Communication - Other Results (Clinic Use ONLY) >> Feb 09, 2019  2:27 PM Lennox Solders wrote: Pt would like someone to call him back with results of esophagus. Pt does not want to go through mychart to see results

## 2019-02-26 ENCOUNTER — Telehealth: Payer: Self-pay

## 2019-02-26 NOTE — Telephone Encounter (Signed)
Copied from [place] [phone]. Topic: Referral - Request for Referral >> Feb 26, 2019  3:57 PM Vernona Rieger wrote: Has patient seen PCP for this complaint? yes *If NO, is insurance requiring patient see PCP for this issue before PCP can refer them? Referral for which specialty: Dermatologist  Preferred provider/office: Anyone that can get him in sooner than August. Dr Allyson Sabal can not see him until August Reason for referral: Rash that comes and goes all over his body

## 2019-02-27 NOTE — Telephone Encounter (Signed)
Is t his hives  Or another rash   Do we need a video visit?  Or

## 2019-02-27 NOTE — Telephone Encounter (Signed)
Lvm for pt to call back. 

## 2019-03-02 NOTE — Telephone Encounter (Signed)
Left message on machine for patient to return our call CRM 

## 2019-03-04 ENCOUNTER — Ambulatory Visit (INDEPENDENT_AMBULATORY_CARE_PROVIDER_SITE_OTHER): Payer: Self-pay | Admitting: Internal Medicine

## 2019-03-04 ENCOUNTER — Encounter: Payer: Self-pay | Admitting: Internal Medicine

## 2019-03-04 ENCOUNTER — Other Ambulatory Visit: Payer: Self-pay

## 2019-03-04 DIAGNOSIS — L508 Other urticaria: Secondary | ICD-10-CM

## 2019-03-04 DIAGNOSIS — R21 Rash and other nonspecific skin eruption: Secondary | ICD-10-CM

## 2019-03-04 NOTE — Progress Notes (Signed)
Virtual Visit via Video Note  I connected with@ on 03/04/19 at  3:15 PM EDT by a video enabled telemedicine application and verified that I am speaking with the correct person using two identifiers. Location patient: home Location provider: home office Persons participating in the virtual visit: patient, provider  [place] national recommendations  regarding COVID 19 pandemic   video visit is advised over in office visit for this patient.  Pt aware  Of the limitations of evaluation and management by telemedicine and  availability of in person appointments. The patient expressed understanding and agreed to proceed.   HPI: Brad Terry pressesnts for video visit today for help with  Recurrent rash poss hives. Has been having  Itchy ? Hive rash off an on for months    Controls if taking  Zyrtec and pepcid.    Varies location  Arms legs  Other    No associated sx   Fever sob  Cough .  Not seeming ly related to  Medicaitons.     Although stopped aleve incase .    Made appt with derm but out to august for evaluation.   ROS: See pertinent positives and negatives per HPI. No resp associ sx   Past Medical History:  Diagnosis Date  . CHEST PAIN UNSPECIFIED 01/04/2010   Qualifier: Diagnosis of  By: [name], MD, [name]    . Diverticulosis   . Headaches due to old head injury   . Hyperlipidemia   . Internal hemorrhoids   . Renal cyst   . 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 KNEE ARTHROSCOPY     right    Family History  Problem Relation Age of Onset  . Diabetes Mother   . Diabetes Brother        x 2  . Prostate cancer Brother        x 2  . Colon cancer Neg Hx     Social History   Tobacco Use  . Smoking status: Never Smoker  . Smokeless tobacco: Never Used  Substance Use Topics  . Alcohol use: No    Alcohol/week: 0.0 standard drinks  . Drug use: No      Current  Outpatient Medications:  .  meloxicam (MOBIC) 7.5 MG tablet, Take 1 tablet (7.5 mg total) daily by mouth. Can take twice a day if needed (Patient not taking: Reported on 12/05/2018), Disp: 30 tablet, Rfl: 1 .  mometasone (ELOCON) 0.1 % lotion, Apply topically daily., Disp: 60 mL, Rfl: 1  EXAM: BP Readings from Last 3 Encounters:  01/07/19 114/66  12/05/18 120/64  02/26/18 112/80    VITALS per patient if applicable:  GENERAL: alert, oriented, appears well and in no acute distress  HEENT: atraumatic, conjunttiva clear, no obvious abnormalities on inspection of external nose and ears  NECK: normal movements of the head and neck  LUNGS: on inspection no signs of respiratory distress, breathing rate appears normal, no obvious gross SOB, gasping or wheezing  CV: no obvious cyanosis  MS: moves all visible extremities without noticeable abnormality  PSYCH/NEURO: pleasant and cooperative, no obvious depression or anxiety, speech and thought processing grossly intact   ASSESSMENT AND PLAN:  Discussed the following assessment and plan:  Recurrent urticaria - Plan: Ambulatory referral to Allergy  Rash - Plan: Ambulatory referral to Allergy Sounds like recurrent hives and poss suppressed by antihistamines and  pepcid.  I advise  Referral to allergy for help eval  Placed  Referral  But fu  If worse or resp sx  .  Ok to continue pon medication if needed in the interim.   Save and send picture as possible  No rash today  Counseled.     Expectant management and discussion of plan and treatment with patient with opportunity to ask questions and all were answered. The patient agreed with the plan and demonstrated an understanding of the instructions.   The patient was advised to call back or seek an in-person evaluation if worsening  or having concerns .    Shanon Ace, MD

## 2019-03-19 ENCOUNTER — Ambulatory Visit: Payer: Self-pay | Admitting: Allergy

## 2019-03-20 ENCOUNTER — Other Ambulatory Visit: Payer: Self-pay

## 2019-03-20 ENCOUNTER — Ambulatory Visit (INDEPENDENT_AMBULATORY_CARE_PROVIDER_SITE_OTHER): Payer: BC Managed Care – PPO | Admitting: Allergy

## 2019-03-20 ENCOUNTER — Encounter: Payer: Self-pay | Admitting: Allergy

## 2019-03-20 VITALS — BP 128/74 | HR 63 | Temp 97.8°F | Resp 16 | Ht 67.5 in | Wt 214.0 lb

## 2019-03-20 DIAGNOSIS — L508 Other urticaria: Secondary | ICD-10-CM | POA: Diagnosis not present

## 2019-03-20 NOTE — Patient Instructions (Addendum)
Hives, chronic  -  at this time etiology of hives is unknown.  Hives can be caused by a variety of different triggers including illness/infection, foods, medications, stings, exercise, pressure, vibrations, extremes of temperature to name a few however majority of the time there is no identifiable trigger.  Your symptoms have been ongoing for >6 weeks making this chronic thus will obtain labwork to evaluate: CBC w diff, CMP, tryptase, hive panel, alpha-gal panel  - environmental allergy skin testing today is positive to grasses, weeds, trees and dust mites  - common food allergens skin testing today is negative.   - for management of hives try Xyzal 5mg  daily (this can replace Zyrtec if doesn't make you drowsy and is as effective as Zyrtec in controlling hives) and continue Pepcid 20mg  daily.   - if hives persist despite daily dosing of above medications then increase both medication to 1 tab twice a day  - let us know if twice a day dosing of above medications is still not enough to control hives.  We did briefly discuss Xolair monthly injections for better hive control when high-dose antihistamine regimen is not effective  Follow-up 3-4 months or sooner if needed

## 2019-03-20 NOTE — Progress Notes (Signed)
New Patient Note  RE: Brad Lieurance Terry MRN: [id number] DOB: 1956/09/16 Date of Office Visit: 03/20/2019  Referring provider: Burnis Medin, MD Primary care provider: Burnis Medin, MD  Chief Complaint: Hives  History of present illness: Brad Terry is a 63 y.o. male presenting today for evaluation of hives.  History obtained by Dr. Heber [place], internal medicine resident.   He states that he first experienced hives in January.  His symptoms included bumps and itching on hands, feet, arms, stomach and back. He states that if he scratches the bumps they become welts.  He was evaluated in January by his PCP and started on daily allegra and pepcid and reports improvement in symptoms.  He stopped the medications in April and once again developed the hives.  He started zyrtec and continued pepcid with resolution of symptoms.  He reports drowsiness with zyrtec and allegra the following day after taking these in the evening.  When he does have symptoms they resolve after a few minutes with the use of hydrocortizone cream or taking antihistamines.  If he is not on medications his symptoms happen daily.  Hot showers will trigger symptoms.  He denies bruising with resolution of symptoms.  He denies rhinorrhea, itchy/water eyes.  He has a new dog for less than a year and hardwood in the home.  He denies history of seasonal allergies, eczema or asthma.      Review of systems: Review of Systems  Constitutional: Negative for chills and fever.  HENT: Negative for congestion and sore throat.   Respiratory: Negative for shortness of breath and wheezing.   Gastrointestinal: Negative for nausea and vomiting.  Musculoskeletal: Negative for joint pain.  Skin: Positive for itching.    All other systems negative unless noted above in HPI  Past medical history: Past Medical History:  Diagnosis Date  . CHEST PAIN UNSPECIFIED 01/04/2010   Qualifier: Diagnosis of  By: [name], MD, [name]     . Diverticulosis   . Headaches due to old head injury   . Hyperlipidemia   . Internal hemorrhoids   . Renal cyst   . URTICARIA, ALLERGIC 12/30/2007   Qualifier: Diagnosis of  By: [name] MD, [name]     Past surgical history: Past Surgical History:  Procedure Laterality Date  . KNEE ARTHROSCOPY     right    Family history:  Family History  Problem Relation Age of Onset  . Diabetes Mother   . Diabetes Brother        x 2  . Prostate cancer Brother        x 2  . Colon cancer Neg Hx     Social history: He lives in a home without carpeting with electric heating.  There is a dog in the home.  There is no concern for water damage, mildew or roaches in the home.  He is a [employer].  He denies a smoking history.  Medication List: Allergies as of 03/20/2019      Reactions   Naproxen    Able to take aleve   Piroxicam    REACTION: tinnitus   Statins    Hx of se of 2 statins       Medication List       Accurate as of Mar 20, 2019 11:31 AM. If you have any questions, ask your nurse or doctor.        STOP taking these medications   meloxicam 7.5 MG tablet Commonly known as:  MOBIC Stopped by:  Cayli Escajeda Charmian Muff, MD   mometasone 0.1 % lotion Commonly known as:  [place] Stopped by:  Montserrat Shek Charmian Muff, MD     TAKE these medications   cetirizine 10 MG tablet Commonly known as:  ZYRTEC Take 10 mg by mouth daily.   famotidine 20 MG tablet Commonly known as:  PEPCID Take 20 mg by mouth daily.       Known medication allergies: Allergies  Allergen Reactions  . Naproxen     Able to take aleve  . Piroxicam     REACTION: tinnitus  . Statins     Hx of se of 2 statins      Physical examination: Blood pressure 128/74, pulse 63, temperature 97.8 F (36.6 C), temperature source Oral, resp. rate 16, height 5' 7.5" (1.715 m), weight 214 lb (97.1 kg), SpO2 96 %.  General: Alert, interactive, in no acute distress. HEENT: TMs pearly gray, turbinates mildly  edematous without discharge, post-pharynx unremarkable. Neck: Supple without lymphadenopathy. Lungs: Clear to auscultation without wheezing, rhonchi or rales. {no increased work of breathing. CV: Normal S1, S2 without murmurs. Abdomen: Nondistended, nontender. Skin: Warm and dry, hyperpigmented round lesions noted on top right shoulder and upper back some abraded with open skin Extremities:  No clubbing, cyanosis or edema. Neuro:   Grossly intact.  Diagnositics/Labs: Allergy testing: Environmental allergy skin prick testing is positive to grasses, weeds, trees and dust mites. Common 10 food allergen skin prick testing is negative Allergy testing results were read and interpreted by provider, documented by clinical staff.   Assessment and plan: Urticaria, chronic  -  at this time etiology of hives is unknown.  Hives can be caused by a variety of different triggers including illness/infection, foods, medications, stings, exercise, pressure, vibrations, extremes of temperature to name a few however majority of the time there is no identifiable trigger.  Your symptoms have been ongoing for >6 weeks making this chronic thus will obtain labwork to evaluate: CBC w diff, CMP, tryptase, hive panel, alpha-gal panel  - environmental allergy skin testing today is positive to grasses, weeds, trees and dust mites.  Allergen avoidance measures provided.  - common food allergens skin testing today is negative.   - for management of hives try Xyzal 5mg  daily (this can replace Zyrtec if doesn't make you drowsy and is as effective as Zyrtec in controlling hives) and continue Pepcid 20mg  daily.   - if hives persist despite daily dosing of above medications then increase both medication to 1 tab twice a day  - let us know if twice a day dosing of above medications is still not enough to control hives.  We did briefly discuss Xolair monthly injections for better hive control when high-dose antihistamine regimen is  not effective  Follow-up 3-4 months or sooner if needed  I appreciate the opportunity to take part in Caswell's care. Please do not hesitate to contact me with questions.  Sincerely,   Prudy Feeler, MD Allergy/Immunology [facility] [place] [facility] [place]

## 2019-03-28 LAB — CBC WITH DIFFERENTIAL
Basophils Absolute: 0 10*3/uL (ref 0.0–0.2)
Basos: 1 %
EOS (ABSOLUTE): 0.1 10*3/uL (ref 0.0–0.4)
Eos: 4 %
Hematocrit: 36.5 % — ABNORMAL LOW (ref 37.5–51.0)
Hemoglobin: 12.1 g/dL — ABNORMAL LOW (ref 13.0–17.7)
Immature Grans (Abs): 0 10*3/uL (ref 0.0–0.1)
Immature Granulocytes: 0 %
Lymphocytes Absolute: 1.3 10*3/uL (ref 0.7–3.1)
Lymphs: 37 %
MCH: 29.9 pg (ref 26.6–33.0)
MCHC: 33.2 g/dL (ref 31.5–35.7)
MCV: 90 fL (ref 79–97)
Monocytes Absolute: 0.3 10*3/uL (ref 0.1–0.9)
Monocytes: 8 %
Neutrophils Absolute: 1.7 10*3/uL (ref 1.4–7.0)
Neutrophils: 50 %
RBC: 4.05 x10E6/uL — ABNORMAL LOW (ref 4.14–5.80)
RDW: 13 % (ref 11.6–15.4)
WBC: 3.4 10*3/uL (ref 3.4–10.8)

## 2019-03-28 LAB — COMPREHENSIVE METABOLIC PANEL
ALT: 13 IU/L (ref 0–44)
AST: 21 IU/L (ref 0–40)
Albumin/Globulin Ratio: 1.9 (ref 1.2–2.2)
Albumin: 4.2 g/dL (ref 3.8–4.8)
Alkaline Phosphatase: 53 IU/L (ref 39–117)
BUN/Creatinine Ratio: 13 (ref 10–24)
BUN: 12 mg/dL (ref 8–27)
Bilirubin Total: 0.3 mg/dL (ref 0.0–1.2)
CO2: 25 mmol/L (ref 20–29)
Calcium: 8.8 mg/dL (ref 8.6–10.2)
Chloride: 104 mmol/L (ref 96–106)
Creatinine, Ser: 0.92 mg/dL (ref 0.76–1.27)
GFR calc Af Amer: 102 mL/min/{1.73_m2} (ref >59)
GFR calc non Af Amer: 88 mL/min/{1.73_m2} (ref >59)
Globulin, Total: 2.2 g/dL (ref 1.5–4.5)
Glucose: 100 mg/dL — ABNORMAL HIGH (ref 65–99)
Potassium: 4.1 mmol/L (ref 3.5–5.2)
Sodium: 140 mmol/L (ref 134–144)
Total Protein: 6.4 g/dL (ref 6.0–8.5)

## 2019-03-28 LAB — ALPHA-GAL PANEL
Alpha Gal IgE*: <0.1 kU/L (ref <0.10)
Beef (Bos spp) IgE: <0.1 kU/L (ref <0.35)
Class Interpretation: 0
Class Interpretation: 0
Class Interpretation: 0
Lamb/Mutton (Ovis spp) IgE: <0.1 kU/L (ref <0.35)
Pork (Sus spp) IgE: <0.1 kU/L (ref <0.35)

## 2019-03-28 LAB — TRYPTASE: Tryptase: 5.2 ug/L (ref 2.2–13.2)

## 2019-03-28 LAB — CHRONIC URTICARIA: cu index: 2.6 (ref <10)

## 2019-03-28 LAB — THYROID ANTIBODIES
Thyroglobulin Antibody: <1 IU/mL (ref 0.0–0.9)
Thyroperoxidase Ab SerPl-aCnc: 6 IU/mL (ref 0–34)

## 2019-07-15 ENCOUNTER — Telehealth: Payer: Self-pay

## 2019-07-15 DIAGNOSIS — M549 Dorsalgia, unspecified: Secondary | ICD-10-CM

## 2019-07-15 NOTE — Telephone Encounter (Signed)
Copied from [place] [id number]. Topic: Referral - Request for Referral >> Jul 15, 2019  4:08 PM Parke Poisson wrote: Has patient seen PCP for this complaint? Yes  Referral for which specialty: Physical Therapy Preferred provider/office: [facility]. 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

## 2019-07-16 NOTE — Telephone Encounter (Signed)
Please do referral  As requested for PT

## 2019-07-17 NOTE — Telephone Encounter (Signed)
Referral for PT placed and pt notified that their office will contact with scheduling details.

## 2019-08-04 ENCOUNTER — Encounter: Payer: BC Managed Care – PPO | Admitting: Internal Medicine

## 2019-08-04 ENCOUNTER — Telehealth: Payer: Self-pay

## 2019-08-04 DIAGNOSIS — Z0289 Encounter for other administrative examinations: Secondary | ICD-10-CM

## 2019-08-04 NOTE — Telephone Encounter (Signed)
lvm for pt to call back since pt missed appt at 9:30 tried calling at 9:40 no answer

## 2019-08-18 NOTE — Progress Notes (Signed)
Chief Complaint  Patient presents with  . Annual Exam    HPI: Patient  Brad Terry  63 y.o. comes in today for Preventive Health Care visit    Had hive eval 5- 2020 and suppressed but only if keeps taking  meds Zyrtec pepcid and xyzal.   Reservations about getting flu vaccine while still having recurring hives.    right lower side and back hip area and some down front of leg  But still walks 8.5 miles   Some gets better with exercise but other continues   No injury noted  Sometimes feels  Shoots from deep inside     Right sided head discomfort   No bleeding   Hx of hemorrhoids   Health Maintenance  Topic Date Due  . Hepatitis C Screening  [birth year]  . PNEUMOCOCCAL POLYSACCHARIDE VACCINE AGE 51-64 HIGH RISK  01/08/1958  . HIV Screening  01/09/1971  . TETANUS/TDAP  01/10/2016  . HEMOGLOBIN A1C  10/29/2017  . INFLUENZA VACCINE  06/06/2019  . COLONOSCOPY  12/28/2019   Health Maintenance Review LIFESTYLE:  Exercise:   8.5 miles    Tobacco/ETS: no Alcohol:   no Sugar beverages: Sleep: 5-7  Drug use: no HH of  Work: 5  A dog   ROS:  See hpi  GEN/ HEENT: No fever, significant weight changes sweats headaches vision problems hearing changes, CV/ PULM; No chest pain shortness of breath cough, syncope,edema  change in exercise tolerance. GI /GU: No adominal pain, vomiting, change in bowel habits. No blood in the stool. No significant GU symptoms. SKIN/HEME: ,no acute skin rashes suspicious lesions or bleeding. No lymphadenopathy, nodules, masses.  NEURO/ PSYCH:  No neurologic signs such as weakness numbness. No depression anxiety. IMM/ Allergy: No unusual infections.  Allergy .   REST of 12 system review negative except as per HPI   Past Medical History:  Diagnosis Date  . CHEST PAIN UNSPECIFIED 01/04/2010   Qualifier: Diagnosis of  By: [name], MD, [name]    . Diverticulosis   . Headaches due to old head injury   . Hyperlipidemia   . Internal hemorrhoids    . RECTAL BLEEDING 09/14/2010   Qualifier: Diagnosis of  By: Regis Bill MD, [name]   . Renal cyst   . Rotator cuff (capsule) sprain 05/27/2013   Right    . 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 KNEE ARTHROSCOPY     right    Family History  Problem Relation Age of Onset  . Diabetes Mother   . Diabetes Brother        x 2  . Prostate cancer Brother        x 2  . Colon cancer Neg Hx     Social History   Socioeconomic History  . Marital status: Married    Spouse name: Not on file  . Number of children: Not on file  . Years of education: Not on file  . Highest education level: Not on file  Occupational History  . Not on file  Social Needs  . Financial resource strain: Not on file  . Food insecurity    Worry: Not on file    Inability: Not on file  . Transportation needs    Medical: Not on file    Non-medical: Not on file  Tobacco Use  . Smoking status: Never Smoker  . Smokeless  tobacco: Never Used  Substance and Sexual Activity  . Alcohol use: No    Alcohol/week: 0.0 standard drinks  . Drug use: No  . Sexual activity: Not on file  Lifestyle  . Physical activity    Days per week: Not on file    Minutes per session: Not on file  . Stress: Not on file  Relationships  . Social Herbalist on phone: Not on file    Gets together: Not on file    Attends religious service: Not on file    Active member of club or organization: Not on file    Attends meetings of clubs or organizations: Not on file    Relationship status: Not on file  Other Topics Concern  . 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   Tries to walk with exercise      [place] country of origin    Outpatient Medications Prior to Visit  Medication Sig Dispense Refill  . cetirizine (ZYRTEC) 10 MG tablet Take 10 mg by mouth  daily.    . famotidine (PEPCID) 20 MG tablet Take 20 mg by mouth daily.     No facility-administered medications prior to visit.      EXAM:  BP 122/62 (BP Location: Left Arm, Patient Position: Sitting, Cuff Size: Large)   Pulse 69   Temp 98 F (36.7 C) (Temporal)   Ht 5\' 8"  (1.727 m)   Wt 211 lb (95.7 kg)   SpO2 96%   BMI 32.08 kg/m   Body mass index is 32.08 kg/m. Wt Readings from Last 3 Encounters:  08/19/19 211 lb (95.7 kg)  03/20/19 214 lb (97.1 kg)  01/07/19 213 lb 9.6 oz (96.9 kg)    Physical Exam: Vital signs reviewed [id number] is a well-developed well-nourished alert cooperative    who appearsr stated age in no acute distress.  HEENT: normocephalic atraumatic , no bruits  Eyes: PERRL EOM's full, conjunctiva clear, has bilateral ptyrigeum    , Ears: no deformity EAC's clear TMs with normal landmarks OP, no masked . NECK: supple without masses, thyromegaly or bruits. CHEST/PULM:  Clear to auscultation and percussion breath sounds equal no wheeze , rales or rhonchi. No chest wall deformities or tenderness. CV: PMI is nondisplaced, S1 S2 no gallops, murmurs, rubs. Peripheral pulses are full without delay.No JVD .  ABDOMEN: Bowel sounds normal nontender  No guard or rebound, no hepato splenomegal no CVA tenderness.  Extremtities:  No clubbing cyanosis or edema, no acute joint swelling or redness no focal atrophy rom of hip seems ok   NEURO:  Oriented x3, cranial nerves 3-12 appear to be intact, no obvious focal weakness,gait within normal limits no abnormal reflexes or asymmetrical SKIN: No acute rashes normal turgor, color, no bruising or petechiae. PSYCH: Oriented, good eye contact, no obvious depression anxiety, cognition and judgment appear normal. LN: no cervical axillary inguinal adenopathy Rectal: no masses ; noted  Prostate 1+  Brown stool smear  .   Lab Results  Component Value Date   WBC 3.4 03/20/2019   HGB 12.1 (L) 03/20/2019   HCT 36.5 (L) 03/20/2019    PLT 90.0 (L) 06/04/2017   GLUCOSE 100 (H) 03/20/2019   CHOL 187 04/29/2017   TRIG 37.0 04/29/2017   HDL 54.60 04/29/2017   LDLDIRECT 140.2 07/31/2012   LDLCALC 125 (H) 04/29/2017   ALT 13 03/20/2019   AST 21 03/20/2019  NA 140 03/20/2019   K 4.1 03/20/2019   CL 104 03/20/2019   CREATININE 0.92 03/20/2019   BUN 12 03/20/2019   CO2 25 03/20/2019   TSH 2.56 10/10/2015   PSA 0.79 04/29/2017   HGBA1C 5.8 04/29/2017    BP Readings from Last 3 Encounters:  08/19/19 122/62  03/20/19 128/74  01/07/19 114/66       ASSESSMENT AND PLAN:  Discussed the following assessment and plan:    ICD-10-CM   1. Visit for preventive health examination  123456 Basic metabolic panel    CBC with Differential/Platelet    Hepatic function panel    Lipid panel    TSH  2. Recurrent urticaria  A999333 Basic metabolic panel    CBC with Differential/Platelet    Hepatic function panel    Lipid panel    TSH    C-reactive protein  3. Thrombocytopenia (HCC)  99991111 Basic metabolic panel    CBC with Differential/Platelet    Hepatic function panel    Lipid panel    TSH    C-reactive protein  4. Right-sided back pain, unspecified back location,righ latera and hip to leg   99991111 Basic metabolic panel    CBC with Differential/Platelet    Hepatic function panel    Lipid panel    TSH    C-reactive protein  5. Screening PSA (prostate specific antigen)  Z12.5 PSA  6. Need for hepatitis C screening test  Z11.59 Hepatitis C antibody   Not diabetic  Not sure why flagged as such  Shared Decision Making plan labs  I encourage flu vaccine but he can contact allergy office about  Flu vaccine and hives situation And  Seek care about right hip abd area   Seems mechanical and ms cause  Let us know if we need to do a referral  Patient Care Team: Ivey Cina, [name], MD as PCP - General Olevia Perches Lowella Bandy, MD (Inactive) (Gastroenterology) Curt Bears, MD (Hematology and Oncology) Patient Instructions  Will notify  you  of labs when available.  I advise seeing orthopedics .  Or sports medicine to help define.    And how to help this problem .    Advise flu vaccine     And you are also may be due for td     Health Maintenance, Male Adopting a healthy lifestyle and getting preventive care are important in promoting health and wellness. Ask your health care provider about:  The right schedule for you to have regular tests and exams.  Things you can do on your own to prevent diseases and keep yourself healthy. What should I know about diet, weight, and exercise? Eat a healthy diet   Eat a diet that includes plenty of vegetables, fruits, low-fat dairy products, and lean protein.  Do not eat a lot of foods that are high in solid fats, added sugars, or sodium. Maintain a healthy weight Body mass index (BMI) is a measurement that can be used to identify possible weight problems. It estimates body fat based on height and weight. Your health care provider can help determine your BMI and help you achieve or maintain a healthy weight. Get regular exercise Get regular exercise. This is one of the most important things you can do for your health. Most adults should:  Exercise for at least 150 minutes each week. The exercise should increase your heart rate and make you sweat (moderate-intensity exercise).  Do strengthening exercises at least twice a week. This is in  addition to the moderate-intensity exercise.  Spend less time sitting. Even light physical activity can be beneficial. Watch cholesterol and blood lipids Have your blood tested for lipids and cholesterol at 63 years of age, then have this test every 5 years. You may need to have your cholesterol levels checked more often if:  Your lipid or cholesterol levels are high.  You are older than 63 years of age.  You are at high risk for heart disease. What should I know about cancer screening? Many types of cancers can be detected early and may  often be prevented. Depending on your health history and family history, you may need to have cancer screening at various ages. This may include screening for:  Colorectal cancer.  Prostate cancer.  Skin cancer.  Lung cancer. What should I know about heart disease, diabetes, and high blood pressure? Blood pressure and heart disease  High blood pressure causes heart disease and increases the risk of stroke. This is more likely to develop in people who have high blood pressure readings, are of African descent, or are overweight.  Talk with your health care provider about your target blood pressure readings.  Have your blood pressure checked: ? Every 3-5 years if you are 79-48 years of age. ? Every year if you are 27 years old or older.  If you are between the ages of 3 and 13 and are a current or former smoker, ask your health care provider if you should have a one-time screening for abdominal aortic aneurysm (AAA). Diabetes Have regular diabetes screenings. This checks your fasting blood sugar level. Have the screening done:  Once every three years after age 58 if you are at a normal weight and have a low risk for diabetes.  More often and at a younger age if you are overweight or have a high risk for diabetes. What should I know about preventing infection? Hepatitis B If you have a higher risk for hepatitis B, you should be screened for this virus. Talk with your health care provider to find out if you are at risk for hepatitis B infection. Hepatitis C Blood testing is recommended for:  Everyone born from [birth year] through 1965.  Anyone with known risk factors for hepatitis C. Sexually transmitted infections (STIs)  You should be screened each year for STIs, including gonorrhea and chlamydia, if: ? You are sexually active and are younger than 63 years of age. ? You are older than 63 years of age and your health care provider tells you that you are at risk for this type of  infection. ? Your sexual activity has changed since you were last screened, and you are at increased risk for chlamydia or gonorrhea. Ask your health care provider if you are at risk.  Ask your health care provider about whether you are at high risk for HIV. Your health care provider may recommend a prescription medicine to help prevent HIV infection. If you choose to take medicine to prevent HIV, you should first get tested for HIV. You should then be tested every 3 months for as long as you are taking the medicine. Follow these instructions at home: Lifestyle  Do not use any products that contain nicotine or tobacco, such as cigarettes, e-cigarettes, and chewing tobacco. If you need help quitting, ask your health care provider.  Do not use street drugs.  Do not share needles.  Ask your health care provider for help if you need support or information about quitting drugs. Alcohol  use  Do not drink alcohol if your health care provider tells you not to drink.  If you drink alcohol: ? Limit how much you have to 0-2 drinks a day. ? Be aware of how much alcohol is in your drink. In the [place] one drink equals one 12 oz bottle of beer (355 mL), one 5 oz glass of wine (148 mL), or one 1 oz glass of hard liquor (44 mL). General instructions  Schedule regular health, dental, and eye exams.  Stay current with your vaccines.  Tell your health care provider if: ? You often feel depressed. ? You have ever been abused or do not feel safe at home. Summary  Adopting a healthy lifestyle and getting preventive care are important in promoting health and wellness.  Follow your health care provider's instructions about healthy diet, exercising, and getting tested or screened for diseases.  Follow your health care provider's instructions on monitoring your cholesterol and blood pressure. This information is not intended to replace advice given to you by your health care provider. Make sure you  discuss any questions you have with your health care provider. Document Released: 04/19/2008 Document Revised: 10/15/2018 Document Reviewed: 10/15/2018 Elsevier Patient Education  2020 Artas Paizleigh Wilds M.D.

## 2019-08-19 ENCOUNTER — Ambulatory Visit (INDEPENDENT_AMBULATORY_CARE_PROVIDER_SITE_OTHER): Payer: BC Managed Care – PPO | Admitting: Internal Medicine

## 2019-08-19 ENCOUNTER — Encounter: Payer: Self-pay | Admitting: Internal Medicine

## 2019-08-19 ENCOUNTER — Other Ambulatory Visit: Payer: Self-pay

## 2019-08-19 VITALS — BP 122/62 | HR 69 | Temp 98.0°F | Ht 68.0 in | Wt 211.0 lb

## 2019-08-19 DIAGNOSIS — Z1159 Encounter for screening for other viral diseases: Secondary | ICD-10-CM

## 2019-08-19 DIAGNOSIS — M549 Dorsalgia, unspecified: Secondary | ICD-10-CM

## 2019-08-19 DIAGNOSIS — Z125 Encounter for screening for malignant neoplasm of prostate: Secondary | ICD-10-CM | POA: Diagnosis not present

## 2019-08-19 DIAGNOSIS — Z0000 Encounter for general adult medical examination without abnormal findings: Secondary | ICD-10-CM | POA: Diagnosis not present

## 2019-08-19 DIAGNOSIS — L508 Other urticaria: Secondary | ICD-10-CM

## 2019-08-19 DIAGNOSIS — D696 Thrombocytopenia, unspecified: Secondary | ICD-10-CM | POA: Diagnosis not present

## 2019-08-19 NOTE — Patient Instructions (Signed)
Will notify you  of labs when available.  I advise seeing orthopedics .  Or sports medicine to help define.    And how to help this problem .    Advise flu vaccine     And you are also may be due for td     Health Maintenance, Male Adopting a healthy lifestyle and getting preventive care are important in promoting health and wellness. Ask your health care provider about:  The right schedule for you to have regular tests and exams.  Things you can do on your own to prevent diseases and keep yourself healthy. What should I know about diet, weight, and exercise? Eat a healthy diet   Eat a diet that includes plenty of vegetables, fruits, low-fat dairy products, and lean protein.  Do not eat a lot of foods that are high in solid fats, added sugars, or sodium. Maintain a healthy weight Body mass index (BMI) is a measurement that can be used to identify possible weight problems. It estimates body fat based on height and weight. Your health care provider can help determine your BMI and help you achieve or maintain a healthy weight. Get regular exercise Get regular exercise. This is one of the most important things you can do for your health. Most adults should:  Exercise for at least 150 minutes each week. The exercise should increase your heart rate and make you sweat (moderate-intensity exercise).  Do strengthening exercises at least twice a week. This is in addition to the moderate-intensity exercise.  Spend less time sitting. Even light physical activity can be beneficial. Watch cholesterol and blood lipids Have your blood tested for lipids and cholesterol at 63 years of age, then have this test every 5 years. You may need to have your cholesterol levels checked more often if:  Your lipid or cholesterol levels are high.  You are older than 63 years of age.  You are at high risk for heart disease. What should I know about cancer screening? Many types of cancers can be detected  early and may often be prevented. Depending on your health history and family history, you may need to have cancer screening at various ages. This may include screening for:  Colorectal cancer.  Prostate cancer.  Skin cancer.  Lung cancer. What should I know about heart disease, diabetes, and high blood pressure? Blood pressure and heart disease  High blood pressure causes heart disease and increases the risk of stroke. This is more likely to develop in people who have high blood pressure readings, are of African descent, or are overweight.  Talk with your health care provider about your target blood pressure readings.  Have your blood pressure checked: ? Every 3-5 years if you are 59-58 years of age. ? Every year if you are 35 years old or older.  If you are between the ages of 23 and 61 and are a current or former smoker, ask your health care provider if you should have a one-time screening for abdominal aortic aneurysm (AAA). Diabetes Have regular diabetes screenings. This checks your fasting blood sugar level. Have the screening done:  Once every three years after age 39 if you are at a normal weight and have a low risk for diabetes.  More often and at a younger age if you are overweight or have a high risk for diabetes. What should I know about preventing infection? Hepatitis B If you have a higher risk for hepatitis B, you should be screened for  this virus. Talk with your health care provider to find out if you are at risk for hepatitis B infection. Hepatitis C Blood testing is recommended for:  Everyone born from [birth year] through 1965.  Anyone with known risk factors for hepatitis C. Sexually transmitted infections (STIs)  You should be screened each year for STIs, including gonorrhea and chlamydia, if: ? You are sexually active and are younger than 63 years of age. ? You are older than 63 years of age and your health care provider tells you that you are at risk for this  type of infection. ? Your sexual activity has changed since you were last screened, and you are at increased risk for chlamydia or gonorrhea. Ask your health care provider if you are at risk.  Ask your health care provider about whether you are at high risk for HIV. Your health care provider may recommend a prescription medicine to help prevent HIV infection. If you choose to take medicine to prevent HIV, you should first get tested for HIV. You should then be tested every 3 months for as long as you are taking the medicine. Follow these instructions at home: Lifestyle  Do not use any products that contain nicotine or tobacco, such as cigarettes, e-cigarettes, and chewing tobacco. If you need help quitting, ask your health care provider.  Do not use street drugs.  Do not share needles.  Ask your health care provider for help if you need support or information about quitting drugs. Alcohol use  Do not drink alcohol if your health care provider tells you not to drink.  If you drink alcohol: ? Limit how much you have to 0-2 drinks a day. ? Be aware of how much alcohol is in your drink. In the [place] one drink equals one 12 oz bottle of beer (355 mL), one 5 oz glass of wine (148 mL), or one 1 oz glass of hard liquor (44 mL). General instructions  Schedule regular health, dental, and eye exams.  Stay current with your vaccines.  Tell your health care provider if: ? You often feel depressed. ? You have ever been abused or do not feel safe at home. Summary  Adopting a healthy lifestyle and getting preventive care are important in promoting health and wellness.  Follow your health care provider's instructions about healthy diet, exercising, and getting tested or screened for diseases.  Follow your health care provider's instructions on monitoring your cholesterol and blood pressure. This information is not intended to replace advice given to you by your health care provider. Make sure  you discuss any questions you have with your health care provider. Document Released: 04/19/2008 Document Revised: 10/15/2018 Document Reviewed: 10/15/2018 Elsevier Patient Education  2020 [employer].

## 2019-08-20 LAB — HEPATITIS C ANTIBODY
Hepatitis C Ab: NONREACTIVE
SIGNAL TO CUT-OFF: 0.01 (ref <1.00)

## 2019-08-20 LAB — BASIC METABOLIC PANEL
BUN: 11 mg/dL (ref 6–23)
CO2: 27 mEq/L (ref 19–32)
Calcium: 9.3 mg/dL (ref 8.4–10.5)
Chloride: 106 mEq/L (ref 96–112)
Creatinine, Ser: 1 mg/dL (ref 0.40–1.50)
GFR: 91.14 mL/min (ref >60.00)
Glucose, Bld: 79 mg/dL (ref 70–99)
Potassium: 3.8 mEq/L (ref 3.5–5.1)
Sodium: 140 mEq/L (ref 135–145)

## 2019-08-20 LAB — HEPATIC FUNCTION PANEL
ALT: 12 U/L (ref 0–53)
AST: 17 U/L (ref 0–37)
Albumin: 4.4 g/dL (ref 3.5–5.2)
Alkaline Phosphatase: 47 U/L (ref 39–117)
Bilirubin, Direct: 0.1 mg/dL (ref 0.0–0.3)
Total Bilirubin: 0.6 mg/dL (ref 0.2–1.2)
Total Protein: 6.6 g/dL (ref 6.0–8.3)

## 2019-08-20 LAB — CBC WITH DIFFERENTIAL/PLATELET
Basophils Absolute: 0.1 10*3/uL (ref 0.0–0.1)
Basophils Relative: 1.2 % (ref 0.0–3.0)
Eosinophils Absolute: 0.1 10*3/uL (ref 0.0–0.7)
Eosinophils Relative: 2.9 % (ref 0.0–5.0)
HCT: 37.3 % — ABNORMAL LOW (ref 39.0–52.0)
Hemoglobin: 12.5 g/dL — ABNORMAL LOW (ref 13.0–17.0)
Lymphocytes Relative: 27.3 % (ref 12.0–46.0)
Lymphs Abs: 1.2 10*3/uL (ref 0.7–4.0)
MCHC: 33.6 g/dL (ref 30.0–36.0)
MCV: 89.4 fl (ref 78.0–100.0)
Monocytes Absolute: 0.3 10*3/uL (ref 0.1–1.0)
Monocytes Relative: 6.5 % (ref 3.0–12.0)
Neutro Abs: 2.8 10*3/uL (ref 1.4–7.7)
Neutrophils Relative %: 62.1 % (ref 43.0–77.0)
Platelets: 95 10*3/uL — ABNORMAL LOW (ref 150.0–400.0)
RBC: 4.17 Mil/uL — ABNORMAL LOW (ref 4.22–5.81)
RDW: 13.2 % (ref 11.5–15.5)
WBC: 4.5 10*3/uL (ref 4.0–10.5)

## 2019-08-20 LAB — LIPID PANEL
Cholesterol: 216 mg/dL — ABNORMAL HIGH (ref 0–200)
HDL: 55.3 mg/dL (ref >39.00)
LDL Cholesterol: 147 mg/dL — ABNORMAL HIGH (ref 0–99)
NonHDL: 160.29
Total CHOL/HDL Ratio: 4
Triglycerides: 67 mg/dL (ref 0.0–149.0)
VLDL: 13.4 mg/dL (ref 0.0–40.0)

## 2019-08-20 LAB — PSA: PSA: 3.99 ng/mL (ref 0.10–4.00)

## 2019-08-20 LAB — TSH: TSH: 3.2 u[IU]/mL (ref 0.35–4.50)

## 2019-08-20 LAB — C-REACTIVE PROTEIN: CRP: <1 mg/dL (ref 0.5–20.0)

## 2019-08-24 ENCOUNTER — Other Ambulatory Visit: Payer: Self-pay

## 2019-08-24 DIAGNOSIS — Z125 Encounter for screening for malignant neoplasm of prostate: Secondary | ICD-10-CM

## 2019-09-03 ENCOUNTER — Telehealth: Payer: Self-pay | Admitting: Internal Medicine

## 2019-09-03 NOTE — Telephone Encounter (Signed)
Patient is calling for a refill of medication for Hemorid. Patient does not know the name of the pharmacy. And patient stated that he was driving. He was not able to look up the name of the medication.  [facility] ([place]), [place] - [address]  [address] [place] ([place]) [place] [address]  Phone: [phone] Fax: [phone]  Not a 24 hour pharmacy; exact hours not known.

## 2019-09-04 MED ORDER — HYDROCORTISONE ACETATE 25 MG RE SUPP: 25.0000 mg | Freq: Every day | RECTAL | 1 refills | Status: DC

## 2019-09-04 NOTE — Telephone Encounter (Signed)
I refilled med but if  On going needs to go  Go back seeing GI team

## 2019-09-04 NOTE — Telephone Encounter (Signed)
Pt has been notified that refill sent in and to go to GI if on going

## 2019-11-16 ENCOUNTER — Ambulatory Visit: Payer: BC Managed Care – PPO | Attending: Internal Medicine

## 2019-11-16 DIAGNOSIS — Z20822 Contact with and (suspected) exposure to covid-19: Secondary | ICD-10-CM

## 2019-11-18 ENCOUNTER — Telehealth: Payer: Self-pay

## 2019-11-18 LAB — NOVEL CORONAVIRUS, NAA: SARS-CoV-2, NAA: DETECTED — AB

## 2019-11-18 NOTE — Telephone Encounter (Signed)
Pt notified of positive COVID-19 test results. Pt verbalized understanding. Pt reports that he has a cough, and itchy throat .Pt advised to remain in self quarantine until at least 10 days since symptom onset And 3 consecutive days fever free without antipyretics And improvement in respiratory symptoms. Patient advised to utilize over the counter medications to treat symptoms. Pt advised to seek treatment in the ED if respiratory issues/distress develops.Pt advised they should only leave home to seek and medical care and must wear a mask in public. Pt instructed to limit contact with family members or caregivers in the home. Pt advised to practice social distancing and to continue to use good preventative care measures such has frequent hand washing, staying out of crowds and cleaning hard surfaces frequently touched in the home.Pt informed that the health department will likely follow up and may have additional recommendations.

## 2019-11-30 ENCOUNTER — Ambulatory Visit: Payer: BC Managed Care – PPO | Attending: Internal Medicine

## 2019-11-30 DIAGNOSIS — Z20822 Contact with and (suspected) exposure to covid-19: Secondary | ICD-10-CM

## 2019-12-01 ENCOUNTER — Other Ambulatory Visit: Payer: BC Managed Care – PPO

## 2019-12-01 LAB — NOVEL CORONAVIRUS, NAA: SARS-CoV-2, NAA: NOT DETECTED

## 2019-12-04 ENCOUNTER — Telehealth: Payer: Self-pay | Admitting: Internal Medicine

## 2019-12-04 NOTE — Telephone Encounter (Signed)
My last note I had advised   referral to ortho or sports medicine    May we proceed with referral?  ( advised ) imaging can be done as indicated by specialists   Otherwise  We would need a  Updated visit in person or virutal to discuss

## 2019-12-04 NOTE — Telephone Encounter (Signed)
Please advise 

## 2019-12-04 NOTE — Telephone Encounter (Signed)
Wants a referral for a MRI for his arthritis in his back  Please advise

## 2019-12-07 NOTE — Telephone Encounter (Signed)
Lvm for patient to call back

## 2019-12-07 NOTE — Telephone Encounter (Signed)
Spoke with patient and made appt to discuss with Dr.Panosh

## 2019-12-08 ENCOUNTER — Encounter: Payer: Self-pay | Admitting: Internal Medicine

## 2019-12-08 ENCOUNTER — Other Ambulatory Visit: Payer: Self-pay

## 2019-12-08 ENCOUNTER — Telehealth (INDEPENDENT_AMBULATORY_CARE_PROVIDER_SITE_OTHER): Payer: BC Managed Care – PPO | Admitting: Internal Medicine

## 2019-12-08 DIAGNOSIS — M545 Low back pain, unspecified: Secondary | ICD-10-CM

## 2019-12-08 DIAGNOSIS — M533 Sacrococcygeal disorders, not elsewhere classified: Secondary | ICD-10-CM

## 2019-12-08 DIAGNOSIS — M48061 Spinal stenosis, lumbar region without neurogenic claudication: Secondary | ICD-10-CM | POA: Diagnosis not present

## 2019-12-08 DIAGNOSIS — Z8616 Personal history of COVID-19: Secondary | ICD-10-CM | POA: Diagnosis not present

## 2019-12-08 NOTE — Progress Notes (Signed)
Virtual Visit via Video Note  I connected with@ on 12/08/19 at  1:30 PM EST by a video enabled telemedicine application and verified that I am speaking with the correct person using two identifiers. Location patient: home Location provider:work office Persons participating in the virtual visit: patient, provider  [place] national recommendations  regarding COVID 19 pandemic   video visit is advised over in office visit for this patient.  Patient aware  of the limitations of evaluation and management by telemedicine and  availability of in person appointments. and agreed to proceed.   HPI: Brad Terry presents for video visit  Seen last   Fall for  Back and leg problem over time then worsening  And at that time disc  Referral for more evaluation and rx    He is requesting MRI of back  Or other to get more info about the ongoing  Problem still walking but has pain down   right back of leg to achilles  No weakness   No injury Saw Raliegh Ip in dec nov had back x ray and advised pt first before but patient feels  Has been doing self  rx such as this   Earlier in January he and his family developed Covid.  He had upper respiratory congestion some headache and a cough and achy but it is quickly resolved and he has no residual symptoms.  Positive test covid 1 11 21  and negati e 1 26  ROS: See pertinent positives and negatives per HPI.  Still gets right-sided head pain from the back up to the front but no real change from what he has had over the years. He still walks many miles 8 a day.  Past Medical History:  Diagnosis Date  . CHEST PAIN UNSPECIFIED 01/04/2010   Qualifier: Diagnosis of  By: [name], MD, [name]    . Diverticulosis   . Headaches due to old head injury   . Hyperlipidemia   . Internal hemorrhoids   . RECTAL BLEEDING 09/14/2010   Qualifier: Diagnosis of  By: Regis Bill MD, [name]   . Renal cyst   . Rotator cuff (capsule) sprain 05/27/2013   Right    . URTICARIA,  ALLERGIC 12/30/2007   Qualifier: Diagnosis of  By: [name] MD, [name]     Past Surgical History:  Procedure Laterality Date  . KNEE ARTHROSCOPY     right    Family History  Problem Relation Age of Onset  . Diabetes Mother   . Diabetes Brother        x 2  . Prostate cancer Brother        x 2  . Colon cancer Neg Hx     Social History   Tobacco Use  . Smoking status: Never Smoker  . Smokeless tobacco: Never Used  Substance Use Topics  . Alcohol use: No    Alcohol/week: 0.0 standard drinks  . Drug use: No      Current Outpatient Medications:  .  cetirizine (ZYRTEC) 10 MG tablet, Take 10 mg by mouth daily., Disp: , Rfl:  .  famotidine (PEPCID) 20 MG tablet, Take 20 mg by mouth daily., Disp: , Rfl:  .  hydrocortisone (ANUSOL-HC) 25 MG suppository, Place 1 suppository (25 mg total) rectally at bedtime. As needed, Disp: 12 suppository, Rfl: 1  EXAM: BP Readings from Last 3 Encounters:  08/19/19 122/62  03/20/19 128/74  01/07/19 114/66    VITALS per patient if applicable: Looks well.  GENERAL: alert, oriented,  appears well and in no acute distress  HEENT: atraumatic, conjunttiva clear, no obvious abnormalities on inspection of external nose and ears  NECK: normal movements of the head and neck  LUNGS: on inspection no signs of respiratory distress, breathing rate appears normal, no obvious gross SOB, gasping or wheezing  CV: no obvious cyanosis   PSYCH/NEURO: pleasant and cooperative, no obvious depression or anxiety, speech and thought processing grossly intact   ASSESSMENT AND PLAN:  Discussed the following assessment and plan:    ICD-10-CM   1. Low back pain radiating to right leg  M54.5 MR Lumbar Spine Wo Contrast  2. Sacroiliac joint pain  M53.3 MR Lumbar Spine Wo Contrast  3. History of 2019 novel coronavirus disease (COVID-19)  Z86.16    On going and not improving despite walking ctivity other exrecises   Declined PT  As feels not helpfuyl without  knowing what is the cause  reasonable to do imagine study  Since on going and an active person .   No other alarm sx  He still has some somes  Right head radiation pain seems like   Cervicogenic ha  Review of mri head and neck 2011   borderline inc  Pituitary size  No abnormality     consdier neuro other eval if indicated  But at this time the  Back/ leg is the most problematic and I agree for further evaluation it is reasonable to get an MRI imaging.  Conservative treatment so far activity and management has been unsuccessful.  Hx of recent covid doing well    Now denies residual.  Counseled.  Even though has had Covid  w recovery still get vaccine when eligible.  Expectant management and discussion of plan and treatment with opportunity to ask questions and all were answered. The patient agreed with the plan and demonstrated an understanding of the instructions.  I cannot find any office notes from his orthopedic check with Raliegh Ip at this time. Advised to call back or seek an in-person evaluation if worsening  or having  further concerns . Return for depending on results and referral as appropriate.Shanon Ace, MD    Outside external source  DATA REVIEWED:  Record  Prev imaging in record  covid testing   Total time on date  of service including record review ordering counseling  and plan of care:  30   LUMBAR SPINE - 2-3 VIEW  Comparison: 05/22/2010  Findings: There are five lumbar type vertebral bodies.  Vertebral body heights and disc spaces appear maintained.  Bony alignment appears intact.  No evidence for acute fracture or dislocation is seen.  No significant degenerative changes noted.  Sclerosis associated with the superior aspect of the right sacroiliac joint corresponds with posterior osteophytosis of the sacroiliac joint as noted on prior CT from 2008.  This is increased slightly in size since the prior exam in 2011. The sacroiliac joints otherwise  appear within normal limits and the sacral white lines appear maintained.  IMPRESSION: Slight interval increase in size of a posterior right sacroiliac osteophyte. Otherwise negative with no acute abnormality or significant degenerative change seen.

## 2019-12-28 ENCOUNTER — Encounter: Payer: Self-pay | Admitting: Gastroenterology

## 2020-01-08 ENCOUNTER — Other Ambulatory Visit: Payer: BC Managed Care – PPO

## 2020-01-26 ENCOUNTER — Ambulatory Visit (AMBULATORY_SURGERY_CENTER): Payer: Self-pay | Admitting: *Deleted

## 2020-01-26 ENCOUNTER — Other Ambulatory Visit: Payer: Self-pay

## 2020-01-26 VITALS — Temp 96.6°F | Ht 68.0 in | Wt 208.0 lb

## 2020-01-26 DIAGNOSIS — Z1211 Encounter for screening for malignant neoplasm of colon: Secondary | ICD-10-CM

## 2020-01-26 NOTE — Progress Notes (Signed)
Patient is here in-person for PV. Patient denies any allergies to eggs or soy. Patient denies any problems with anesthesia/sedation. Patient denies any oxygen use at home. Patient denies taking any diet/weight loss medications or blood thinners. Patient is not being treated for MRSA or C-diff. EMMI education assisgned to the patient for the procedure, this was explained and instructions given to patient. COVID-19 screening test not needed, pt + covid on 11/16/2019.  Patient is aware of our care-partner policy and 0000000 safety protocol.

## 2020-02-03 ENCOUNTER — Ambulatory Visit
Admission: RE | Admit: 2020-02-03 | Discharge: 2020-02-03 | Disposition: A | Discharge status: Home / Self Care | Payer: BC Managed Care – PPO | Source: Ambulatory Visit | Attending: Internal Medicine | Admitting: Internal Medicine

## 2020-02-03 ENCOUNTER — Other Ambulatory Visit: Payer: Self-pay

## 2020-02-03 DIAGNOSIS — M533 Sacrococcygeal disorders, not elsewhere classified: Secondary | ICD-10-CM

## 2020-02-03 DIAGNOSIS — M79604 Pain in right leg: Secondary | ICD-10-CM

## 2020-02-03 DIAGNOSIS — M545 Low back pain: Secondary | ICD-10-CM

## 2020-02-03 NOTE — Progress Notes (Signed)
So back mri shows moderate  spinal stenosis at mostly L4 L5  this is most like y causing your pain . I advise we  have you see a neurosurgeon( does nor mean should have surgery ) or other spine specialties to evaluate  You may benefit from injections . Let us know if you agree and we can do referral to neurosurgery or spine spine specialist ( let us know if you have a preference )

## 2020-02-04 NOTE — Addendum Note (Signed)
Addended by: Gwenyth Ober R on: 02/04/2020 12:12 PM   Modules accepted: Orders

## 2020-02-08 ENCOUNTER — Encounter: Payer: Self-pay | Admitting: Gastroenterology

## 2020-02-09 ENCOUNTER — Ambulatory Visit (AMBULATORY_SURGERY_CENTER): Payer: BC Managed Care – PPO | Admitting: Gastroenterology

## 2020-02-09 ENCOUNTER — Encounter: Payer: Self-pay | Admitting: Gastroenterology

## 2020-02-09 ENCOUNTER — Other Ambulatory Visit: Payer: Self-pay

## 2020-02-09 VITALS — BP 125/72 | HR 50 | Temp 96.6°F | Resp 11 | Ht 68.0 in | Wt 208.0 lb

## 2020-02-09 DIAGNOSIS — Z1211 Encounter for screening for malignant neoplasm of colon: Secondary | ICD-10-CM | POA: Diagnosis present

## 2020-02-09 DIAGNOSIS — D122 Benign neoplasm of ascending colon: Secondary | ICD-10-CM | POA: Diagnosis not present

## 2020-02-09 DIAGNOSIS — D123 Benign neoplasm of transverse colon: Secondary | ICD-10-CM

## 2020-02-09 MED ORDER — SODIUM CHLORIDE 0.9 % IV SOLN: 500.0000 mL | Freq: Once | INTRAVENOUS | Status: DC

## 2020-02-09 NOTE — Patient Instructions (Signed)
Handouts Provided:  Polyps, Diverticulosis, Hemorrhoids  YOU HAD AN ENDOSCOPIC PROCEDURE TODAY AT [facility]:   Refer to the procedure report that was given to you for any specific questions about what was found during the examination.  If the procedure report does not answer your questions, please call your gastroenterologist to clarify.  If you requested that your care partner not be given the details of your procedure findings, then the procedure report has been included in a sealed envelope for you to review at your convenience later.  YOU SHOULD EXPECT: Some feelings of bloating in the abdomen. Passage of more gas than usual.  Walking can help get rid of the air that was put into your GI tract during the procedure and reduce the bloating. If you had a lower endoscopy (such as a colonoscopy or flexible sigmoidoscopy) you may notice spotting of blood in your stool or on the toilet paper. If you underwent a bowel prep for your procedure, you may not have a normal bowel movement for a few days.  Please Note:  You might notice some irritation and congestion in your nose or some drainage.  This is from the oxygen used during your procedure.  There is no need for concern and it should clear up in a day or so.  SYMPTOMS TO REPORT IMMEDIATELY:   Following lower endoscopy (colonoscopy or flexible sigmoidoscopy):  Excessive amounts of blood in the stool  Significant tenderness or worsening of abdominal pains  Swelling of the abdomen that is new, acute  Fever of 100F or higher  For urgent or emergent issues, a gastroenterologist can be reached at any hour by calling [phone]. Do not use MyChart messaging for urgent concerns.    DIET:  We do recommend a small meal at first, but then you may proceed to your regular diet.  Drink plenty of fluids but you should avoid alcoholic beverages for 24 hours.  ACTIVITY:  You should plan to take it easy for the rest of today and you should  NOT DRIVE or use heavy machinery until tomorrow (because of the sedation medicines used during the test).    FOLLOW UP: Our staff will call the number listed on your records 48-72 hours following your procedure to check on you and address any questions or concerns that you may have regarding the information given to you following your procedure. If we do not reach you, we will leave a message.  We will attempt to reach you two times.  During this call, we will ask if you have developed any symptoms of COVID 19. If you develop any symptoms (ie: fever, flu-like symptoms, shortness of breath, cough etc.) before then, please call [phone].  If you test positive for Covid 19 in the 2 weeks post procedure, please call and report this information to [place].    If any biopsies were taken you will be contacted by phone or by letter within the next 1-3 weeks.  Please call us at [phone] if you have not heard about the biopsies in 3 weeks.    SIGNATURES/CONFIDENTIALITY: You and/or your care partner have signed paperwork which will be entered into your electronic medical record.  These signatures attest to the fact that that the information above on your After Visit Summary has been reviewed and is understood.  Full responsibility of the confidentiality of this discharge information lies with you and/or your care-partner.

## 2020-02-09 NOTE — Progress Notes (Signed)
Called to room to assist during endoscopic procedure.  Patient ID and intended procedure confirmed with present staff. Received instructions for my participation in the procedure from the performing physician.  

## 2020-02-09 NOTE — Progress Notes (Signed)
Pt's states no medical or surgical changes since previsit or office visit. 

## 2020-02-09 NOTE — Progress Notes (Signed)
Temperature taken by J.B.. VS taken by C.W.

## 2020-02-09 NOTE — Op Note (Signed)
Brad Terry Patient Name: Brad Terry Procedure Date: 02/09/2020 8:50 AM MRN: [id number] Endoscopist: Mauri Pole , MD Age: 64 Referring MD:  Date of Birth: 12/27/1955 Gender: Male Account #: [id number] Procedure:                Colonoscopy Indications:              Screening for colorectal malignant neoplasm Medicines:                Monitored Anesthesia Care Procedure:                Pre-Anesthesia Assessment:                           - Prior to the procedure, a History and Physical                            was performed, and patient medications and                            allergies were reviewed. The patient's tolerance of                            previous anesthesia was also reviewed. The risks                            and benefits of the procedure and the sedation                            options and risks were discussed with the patient.                            All questions were answered, and informed consent                            was obtained. Prior Anticoagulants: The patient has                            taken no previous anticoagulant or antiplatelet                            agents. ASA Grade Assessment: II - A patient with                            mild systemic disease. After reviewing the risks                            and benefits, the patient was deemed in                            satisfactory condition to undergo the procedure.                           After obtaining informed consent, the colonoscope  was passed under direct vision. Throughout the                            procedure, the patient's blood pressure, pulse, and                            oxygen saturations were monitored continuously. The                            Colonoscope was introduced through the anus and                            advanced to the the cecum, identified by                            appendiceal orifice  and ileocecal valve. The                            colonoscopy was performed without difficulty. The                            patient tolerated the procedure well. The quality                            of the bowel preparation was excellent. The                            ileocecal valve, appendiceal orifice, and rectum                            were photographed. Scope In: 9:02:12 AM Scope Out: 9:14:30 AM Scope Withdrawal Time: 0 hours 10 minutes 3 seconds  Total Procedure Duration: 0 hours 12 minutes 18 seconds  Findings:                 The perianal and digital rectal examinations were                            normal.                           Three sessile polyps were found in the transverse                            colon and ascending colon. The polyps were 5 to 9                            mm in size. These polyps were removed with a cold                            snare. Resection and retrieval were complete.                           A less than 1 mm polyp was found in the ascending  colon. The polyp was sessile. The polyp was removed                            with a cold biopsy forceps. Resection and retrieval                            were complete.                           A few small-mouthed diverticula were found in the                            sigmoid colon, descending colon, transverse colon                            and ascending colon.                           Non-bleeding internal hemorrhoids were found during                            retroflexion. The hemorrhoids were small.                           The exam was otherwise without abnormality. Complications:            No immediate complications. Estimated Blood Loss:     Estimated blood loss was minimal. Impression:               - Three 5 to 9 mm polyps in the transverse colon                            and in the ascending colon, removed with a cold                             snare. Resected and retrieved.                           - One less than 1 mm polyp in the ascending colon,                            removed with a cold biopsy forceps. Resected and                            retrieved.                           - Mild diverticulosis in the sigmoid colon, in the                            descending colon, in the transverse colon and in                            the ascending colon.                           -  Non-bleeding internal hemorrhoids.                           - The examination was otherwise normal. Recommendation:           - Patient has a contact number available for                            emergencies. The signs and symptoms of potential                            delayed complications were discussed with the                            patient. Return to normal activities tomorrow.                            Written discharge instructions were provided to the                            patient.                           - Resume previous diet.                           - Continue present medications.                           - Await pathology results.                           - Repeat colonoscopy in 3 - 5 years for                            surveillance based on pathology results. Mauri Pole, MD 02/09/2020 9:18:20 AM This report has been signed electronically.

## 2020-02-09 NOTE — Progress Notes (Signed)
pt tolerated well. VSS. awake and to recovery. Report given to RN.  

## 2020-02-11 ENCOUNTER — Telehealth: Payer: Self-pay

## 2020-02-11 ENCOUNTER — Telehealth: Payer: Self-pay | Admitting: *Deleted

## 2020-02-11 NOTE — Telephone Encounter (Signed)
Left message on follow up call. 

## 2020-02-11 NOTE — Telephone Encounter (Signed)
No answer for post procedure call back. Left message for patient to call with questions or concerns. 

## 2020-02-12 ENCOUNTER — Encounter: Payer: Self-pay | Admitting: Gastroenterology

## 2020-10-03 ENCOUNTER — Telehealth: Payer: Self-pay | Admitting: Internal Medicine

## 2020-10-03 DIAGNOSIS — H5713 Ocular pain, bilateral: Secondary | ICD-10-CM

## 2020-10-03 NOTE — Telephone Encounter (Signed)
OK to refer  But we need a diagnosis

## 2020-10-03 NOTE — Telephone Encounter (Signed)
Patient needs a referral to an eye dr.

## 2020-10-04 NOTE — Telephone Encounter (Signed)
Called LVM for patient to call back and give more information as to why needing referral

## 2020-10-04 NOTE — Telephone Encounter (Signed)
Patient called back and stated that he is requesting a referral because for the past 6 months he has been having pain behind both eye sockets and he have spots on his eyes, please advise. CB is [phone]

## 2020-10-04 NOTE — Telephone Encounter (Signed)
Referral entered, they will contact pt to schedule appt.

## 2020-10-04 NOTE — Addendum Note (Signed)
Addended by: Rodrigo Ran on: 10/04/2020 04:40 PM   Modules accepted: Orders

## 2021-07-11 ENCOUNTER — Other Ambulatory Visit: Payer: Self-pay

## 2021-07-12 ENCOUNTER — Encounter: Payer: Self-pay | Admitting: Internal Medicine

## 2021-07-12 ENCOUNTER — Ambulatory Visit (INDEPENDENT_AMBULATORY_CARE_PROVIDER_SITE_OTHER): Payer: BC Managed Care – PPO | Admitting: Internal Medicine

## 2021-07-12 VITALS — BP 120/70 | HR 66 | Temp 98.7°F | Ht 68.0 in | Wt 200.4 lb

## 2021-07-12 DIAGNOSIS — R2241 Localized swelling, mass and lump, right lower limb: Secondary | ICD-10-CM

## 2021-07-12 DIAGNOSIS — Z9181 History of falling: Secondary | ICD-10-CM

## 2021-07-12 DIAGNOSIS — M79604 Pain in right leg: Secondary | ICD-10-CM

## 2021-07-12 DIAGNOSIS — S299XXA Unspecified injury of thorax, initial encounter: Secondary | ICD-10-CM | POA: Diagnosis not present

## 2021-07-12 DIAGNOSIS — R519 Headache, unspecified: Secondary | ICD-10-CM

## 2021-07-12 DIAGNOSIS — D696 Thrombocytopenia, unspecified: Secondary | ICD-10-CM

## 2021-07-12 NOTE — Patient Instructions (Signed)
You will  be notified about sports medicine  consult  and headache consult.   To address the  nodule and right leg pain  and right sided headaches continuing .Lungs are clear  you may have bruised ribs   if  persistent or progressive can get more evaluation x ray .  Marland Kitchen   Get appt for fasting blood work .  As we discussed to check  sugar lipids blood count etc.

## 2021-07-12 NOTE — Progress Notes (Signed)
Chief Complaint  Patient presents with   Referral     HPI: Brad Terry 65 y.o. come in for a couple of problems of concern Lump on right lower calf continues and now pain from the lower leg that radiates up affects his ability to walk and exercise he walks 10 to 11 miles a day.  Has some discomfort in the hip area on the left. He did see maybe a dermatologist someone who injected hydrocortisone in the area and it did not make a difference in the past He recently fell and slipped on the gravel around the mailbox and hit his left lateral chest wall that was very sore a couple days ago.  Does not hurt to breathe but when moves a certain way.  He continues to have intermittent right-sided headaches he points to right frontal and back and top. Remote history of headache evaluation and neck pain years ago.  He states that it could be worse with stress and activity but does not bother him on his 11 mile walks. Would like some help with this.  I  ROS: See pertinent positives and negatives per HPI.  States that he has some mild upper respiratory may be allergy symptoms.  No shortness of breath no unusual bleeding has seen the urologist in the last recent past with PSA levels  Past Medical History:  Diagnosis Date   Allergy    CHEST PAIN UNSPECIFIED 01/04/2010   Qualifier: Diagnosis of  By: [name], MD, [name]     Diverticulosis    GERD (gastroesophageal reflux disease)    Headaches due to old head injury    Hyperlipidemia    Internal hemorrhoids    RECTAL BLEEDING 09/14/2010   Qualifier: Diagnosis of  By: Regis Bill MD, [name]    Renal cyst    Rotator cuff (capsule) sprain 05/27/2013   Right     URTICARIA, ALLERGIC 12/30/2007   Qualifier: Diagnosis of  By: [name] MD, [name]     Family History  Problem Relation Age of Onset   Diabetes Mother    Diabetes Brother        x 2   Prostate cancer Brother        x 2   Colon cancer Neg Hx    Colon polyps Neg Hx    Esophageal  cancer Neg Hx    Rectal cancer Neg Hx    Stomach cancer Neg Hx     Social History   Socioeconomic History   Marital status: Married    Spouse name: Not on file   Number of children: Not on file   Years of education: Not on file   Highest education level: Not on file  Occupational History   Not on file  Tobacco Use   Smoking status: Never   Smokeless tobacco: Never  Vaping Use   Vaping Use: Never used  Substance and Sexual Activity   Alcohol use: No    Alcohol/week: 0.0 standard drinks   Drug use: No   Sexual activity: Not on file  Other Topics Concern   Not on file  Social History Narrative   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   Tries to walk with exercise      [place] country of origin   Social Determinants of Health   Financial Resource Strain: Not on [employer] Insecurity: Not on file  Transportation Needs: Not on file  Physical Activity: Not on file  Stress: Not on file  Social Connections: Not on file    Outpatient Medications Prior to Visit  Medication Sig Dispense Refill   Ascorbic Acid (VITAMIN C) 1000 MG tablet Take 1,000 mg by mouth daily.     cholecalciferol (VITAMIN D3) 25 MCG (1000 UNIT) tablet Take 2,000 Units by mouth daily.     ELDERBERRY PO Take by mouth.     hydrocortisone (ANUSOL-HC) 25 MG suppository Place 1 suppository (25 mg total) rectally at bedtime. As needed 12 suppository 1   levocetirizine (XYZAL) 5 MG tablet Take 5 mg by mouth every evening.     No facility-administered medications prior to visit.     EXAM:  BP 120/70 (BP Location: Left Arm, Patient Position: Sitting, Cuff Size: Normal)   Pulse 66   Temp 98.7 F (37.1 C) (Oral)   Ht '5\' 8"'  (1.727 m)   Wt 200 lb 6.4 oz (90.9 kg)   SpO2 96%   BMI 30.47 kg/m   Body mass index is 30.47 kg/m. Wt Readings from Last 3 Encounters:  07/12/21 200 lb 6.4 oz (90.9 kg)  02/09/20 208 lb (94.3 kg)  01/26/20 208 lb (94.3 kg)    GENERAL: vitals  reviewed and listed above, alert, oriented, appears well hydrated and in no acute distress HEENT: atraumatic, conjunctiva  clear, no obvious abnormalities on inspection of external nose and ears OP : Masked NECK: no obvious masses on inspection palpation  LUNGS: clear to auscultation bilaterally, no wheezes, rales or rhonchi, he is tender on the left lateral lower rib cage area but no bruising step-off or crepitus. CV: HRRR, no clubbing cyanosis or  peripheral edema nl cap refill  MS: moves all extremities right lower extremity with a 2 to 3 cm subcutaneous cystic feeling nodule with some hyperpigmentation without redness or severe tenderness.  Points to the anterior upper leg at the area of pain PSYCH: pleasant and cooperative, no obvious depression or anxiety Lab Results  Component Value Date   WBC 4.5 08/19/2019   HGB 12.5 (L) 08/19/2019   HCT 37.3 (L) 08/19/2019   PLT 95.0 (L) 08/19/2019   GLUCOSE 79 08/19/2019   CHOL 216 (H) 08/19/2019   TRIG 67.0 08/19/2019   HDL 55.30 08/19/2019   LDLDIRECT 140.2 07/31/2012   LDLCALC 147 (H) 08/19/2019   ALT 12 08/19/2019   AST 17 08/19/2019   NA 140 08/19/2019   K 3.8 08/19/2019   CL 106 08/19/2019   CREATININE 1.00 08/19/2019   BUN 11 08/19/2019   CO2 27 08/19/2019   TSH 3.20 08/19/2019   PSA 3.99 08/19/2019   HGBA1C 5.8 04/29/2017   BP Readings from Last 3 Encounters:  07/12/21 120/70  02/09/20 125/72  08/19/19 122/62  Review of record shows MRI of the brain over 10 years ago and 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  No recent brain scans.  ASSESSMENT AND PLAN:  Discussed the following assessment and plan:  Right leg pain - Plan: Basic metabolic panel, CBC with Differential/Platelet, Hemoglobin A1c, Hepatic function panel, Lipid panel, TSH, Sedimentation rate  Skin lump of leg, right - Plan: Basic metabolic panel, CBC with Differential/Platelet, Hemoglobin A1c, Hepatic function panel, Lipid panel, TSH, Sedimentation rate  History  of fall - Plan: Basic metabolic panel, CBC with Differential/Platelet, Hemoglobin A1c, Hepatic function panel, Lipid panel, TSH, Sedimentation rate  Soft tissue injury of left chest wall - Plan: Basic metabolic panel, CBC with Differential/Platelet, Hemoglobin A1c, Hepatic function panel, Lipid panel, TSH, Sedimentation rate  Right-sided headache -  Consider cervicogenic ?continued problem will proceed with referral - Plan: Basic metabolic panel, CBC with Differential/Platelet, Hemoglobin A1c, Hepatic function panel, Lipid panel, TSH, Sedimentation rate  Thrombocytopenia (HCC) - Plan: Basic metabolic panel, CBC with Differential/Platelet, Hemoglobin A1c, Hepatic function panel, Lipid panel, TSH, Sedimentation rate ? Last labs up-to-date with his history of thrombocytopenia will come back for fasting labs.  To get cholesterol blood sugar platelet count etc. in addition ESR Sports medicine referral option of point-of-care ultrasound nodule on the right calf and right shooting leg pain.  He reports it coming from the leg up as opposed to back down like sciatica.  Imaging is appropriate. If the left chest wall symptoms or not improving over the weeks or getting worse consider further evaluation x-ray etc. Headache referral discussed. -Patient advised to return or notify health care team  if  new concerns arise.  Patient Instructions  You will  be notified about sports medicine  consult  and headache consult.   To address the  nodule and right leg pain  and right sided headaches continuing .Lungs are clear  you may have bruised ribs   if  persistent or progressive can get more evaluation x ray .  Marland Kitchen   Get appt for fasting blood work .  As we discussed to check  sugar lipids blood count etc.     [name]. Verbon Giangregorio M.D.

## 2021-07-13 ENCOUNTER — Other Ambulatory Visit (INDEPENDENT_AMBULATORY_CARE_PROVIDER_SITE_OTHER): Payer: BC Managed Care – PPO

## 2021-07-13 ENCOUNTER — Other Ambulatory Visit: Payer: Self-pay

## 2021-07-13 DIAGNOSIS — R2241 Localized swelling, mass and lump, right lower limb: Secondary | ICD-10-CM

## 2021-07-13 DIAGNOSIS — Z9181 History of falling: Secondary | ICD-10-CM | POA: Diagnosis not present

## 2021-07-13 DIAGNOSIS — D696 Thrombocytopenia, unspecified: Secondary | ICD-10-CM

## 2021-07-13 DIAGNOSIS — R519 Headache, unspecified: Secondary | ICD-10-CM

## 2021-07-13 DIAGNOSIS — S299XXA Unspecified injury of thorax, initial encounter: Secondary | ICD-10-CM

## 2021-07-13 DIAGNOSIS — M79604 Pain in right leg: Secondary | ICD-10-CM | POA: Diagnosis not present

## 2021-07-13 LAB — LIPID PANEL
Cholesterol: 206 mg/dL — ABNORMAL HIGH (ref 0–200)
HDL: 57.1 mg/dL (ref >39.00)
LDL Cholesterol: 138 mg/dL — ABNORMAL HIGH (ref 0–99)
NonHDL: 148.71
Total CHOL/HDL Ratio: 4
Triglycerides: 54 mg/dL (ref 0.0–149.0)
VLDL: 10.8 mg/dL (ref 0.0–40.0)

## 2021-07-13 LAB — CBC WITH DIFFERENTIAL/PLATELET
Basophils Absolute: 0 10*3/uL (ref 0.0–0.1)
Basophils Relative: 0.4 % (ref 0.0–3.0)
Eosinophils Absolute: 0.1 10*3/uL (ref 0.0–0.7)
Eosinophils Relative: 4 % (ref 0.0–5.0)
HCT: 38.6 % — ABNORMAL LOW (ref 39.0–52.0)
Hemoglobin: 12.7 g/dL — ABNORMAL LOW (ref 13.0–17.0)
Lymphocytes Relative: 37.9 % (ref 12.0–46.0)
Lymphs Abs: 1.3 10*3/uL (ref 0.7–4.0)
MCHC: 32.9 g/dL (ref 30.0–36.0)
MCV: 88.9 fl (ref 78.0–100.0)
Monocytes Absolute: 0.2 10*3/uL (ref 0.1–1.0)
Monocytes Relative: 7.3 % (ref 3.0–12.0)
Neutro Abs: 1.7 10*3/uL (ref 1.4–7.7)
Neutrophils Relative %: 50.4 % (ref 43.0–77.0)
Platelets: 98 10*3/uL — ABNORMAL LOW (ref 150.0–400.0)
RBC: 4.34 Mil/uL (ref 4.22–5.81)
RDW: 13.8 % (ref 11.5–15.5)
WBC: 3.4 10*3/uL — ABNORMAL LOW (ref 4.0–10.5)

## 2021-07-13 LAB — BASIC METABOLIC PANEL
BUN: 16 mg/dL (ref 6–23)
CO2: 31 mEq/L (ref 19–32)
Calcium: 8.7 mg/dL (ref 8.4–10.5)
Chloride: 104 mEq/L (ref 96–112)
Creatinine, Ser: 0.97 mg/dL (ref 0.40–1.50)
GFR: 81.92 mL/min (ref >60.00)
Glucose, Bld: 82 mg/dL (ref 70–99)
Potassium: 3.8 mEq/L (ref 3.5–5.1)
Sodium: 140 mEq/L (ref 135–145)

## 2021-07-13 LAB — SEDIMENTATION RATE: Sed Rate: 11 mm/hr (ref 0–20)

## 2021-07-13 LAB — HEPATIC FUNCTION PANEL
ALT: 17 U/L (ref 0–53)
AST: 18 U/L (ref 0–37)
Albumin: 4.2 g/dL (ref 3.5–5.2)
Alkaline Phosphatase: 58 U/L (ref 39–117)
Bilirubin, Direct: 0.1 mg/dL (ref 0.0–0.3)
Total Bilirubin: 0.6 mg/dL (ref 0.2–1.2)
Total Protein: 6.6 g/dL (ref 6.0–8.3)

## 2021-07-13 LAB — HEMOGLOBIN A1C: Hgb A1c MFr Bld: 5.9 % (ref 4.6–6.5)

## 2021-07-13 LAB — TSH: TSH: 3.8 u[IU]/mL (ref 0.35–5.50)

## 2021-07-15 NOTE — Progress Notes (Addendum)
Blood ,low platelet count is stable , no diabetes ; kidney and liver tests are normal. Cholesterol could be better but better than in past . 10 year risk  is 8.6% Continue lifestyle intervention healthy eating and exercise .  Proceed with sports medicine and neurology referrals that have already placed

## 2021-07-17 ENCOUNTER — Encounter: Payer: Self-pay | Admitting: Neurology

## 2021-07-21 ENCOUNTER — Other Ambulatory Visit: Payer: Self-pay

## 2021-07-21 ENCOUNTER — Ambulatory Visit: Payer: Self-pay

## 2021-07-21 ENCOUNTER — Ambulatory Visit: Payer: BC Managed Care – PPO | Admitting: Sports Medicine

## 2021-07-21 ENCOUNTER — Ambulatory Visit (INDEPENDENT_AMBULATORY_CARE_PROVIDER_SITE_OTHER): Payer: BC Managed Care – PPO

## 2021-07-21 VITALS — BP 112/82 | HR 60 | Ht 68.0 in | Wt 200.0 lb

## 2021-07-21 DIAGNOSIS — M25551 Pain in right hip: Secondary | ICD-10-CM | POA: Diagnosis not present

## 2021-07-21 DIAGNOSIS — M79661 Pain in right lower leg: Secondary | ICD-10-CM | POA: Diagnosis not present

## 2021-07-21 DIAGNOSIS — M1611 Unilateral primary osteoarthritis, right hip: Secondary | ICD-10-CM | POA: Diagnosis not present

## 2021-07-21 MED ORDER — MELOXICAM 15 MG PO TABS
15.0000 mg | ORAL_TABLET | Freq: Every day | ORAL | 0 refills | Status: DC
Start: 2021-07-21 — End: 2021-12-25

## 2021-07-21 NOTE — Progress Notes (Signed)
Brad Terry D.[place] Phone: [phone]   Assessment and Plan:     1. Right hip pain 3. Primary osteoarthritis of right hip -Subacute, uncomplicated, initial sports medicine visit - Likely underlying osteoarthritis flare that is causing patient's pain based on HPI, physical exam, x-rays - X-ray obtained in clinic.  My interpretation: No acute fracture, decreased joint space with femoral acetabular cortical irregularities consistent with OA - Start meloxicam 15 mg daily x2 to 3 weeks, and may use remaining as needed for pain control - Start HEP for hip for general hip and core strengthening and attempt to offload arthritic joint - Relative rest and activity as tolerated  2. Right calf pain -Chronic, unchanged, initial sports medicine visit - Subcutaneous mass based on ultrasound, likely a lipoma - No further intervention or treatment is necessary at this time [place] LIMITED JOINT SPACE STRUCTURES LOW RIGHT(NO LINKED CHARGES); Future    Other orders - meloxicam (MOBIC) 15 MG tablet; Take 1 tablet (15 mg total) by mouth daily.  Pertinent previous records reviewed include previous FM office note   Follow Up: In 4 weeks for reevaluation.  Could consider advanced imaging versus intra-articular hip CSI if no improvement or worsening of symptoms   Subjective:   I, Brad Terry, am serving as a scribe for Dr. Glennon Terry  Chief Complaint: Right leg pain   HPI: 65 year old Male presenting with right leg pain   07/21/21 Patient states he has lump on right lower calf continues and now pain from the lower leg that radiates up affects his ability to walk and exercise he walks 10 to 11 miles a day.  Has some discomfort in the hip area on the left. He did see maybe a dermatologist someone who injected hydrocortisone in the area and it did not make a difference in the past. Patient describes pain as shooting  pain that goes up the R leg to the lower back. Patient has been having this pain for months. Patient is also having left side Rib pain that has been bothering him for about 2 weeks   Swelling: no Aggravates: walking  Treatments tried: tylenol   Relevant Historical Information: History of chronic intermittent right leg and hip pain  Additional pertinent review of systems negative.   Current Outpatient Medications:    Ascorbic Acid (VITAMIN C) 1000 MG tablet, Take 1,000 mg by mouth daily., Disp: , Rfl:    cholecalciferol (VITAMIN D3) 25 MCG (1000 UNIT) tablet, Take 2,000 Units by mouth daily., Disp: , Rfl:    ELDERBERRY PO, Take by mouth., Disp: , Rfl:    hydrocortisone (ANUSOL-HC) 25 MG suppository, Place 1 suppository (25 mg total) rectally at bedtime. As needed, Disp: 12 suppository, Rfl: 1   levocetirizine (XYZAL) 5 MG tablet, Take 5 mg by mouth every evening., Disp: , Rfl:    Objective:     There were no vitals filed for this visit.    There is no height or weight on file to calculate BMI.    Physical Exam:    General: awake, alert, and oriented no acute distress, nontoxic Skin: no suspicious lesions or rashes Neuro:sensation intact distally with no dificits, normal muscle tone, no atrophy, strength 5/5 in all tested lower ext groups Psych: normal mood and affect, speech clear  Right hip: No deformity, swelling or wasting ROM Fexion 70, ext 15, IR 35, ER 35 NTTP over the hip flexors, adductors, inguinal canal,  pubic ramus, greater troch, glute musculature, si joint, lumbar spine Negative log roll with FROM Positive FABER Positive FADIR Negative Piriformis test Positive trendelenberg Gait normal   Sports Medicine: Musculoskeletal Ultrasound. Exam: Limited US of right lower leg Diagnosis: Right lower leg mass  US Findings: Hypoechoic mass in the subcutaneous tissue with hyperemia and mildly tender with palpation.  Normal-appearing Achilles tendon  US Impression:   Lipoma Electronically signed by:  Brad Terry D.[facility] 7:43 AM 07/21/21

## 2021-07-21 NOTE — Patient Instructions (Addendum)
Good to see you  Meloxicam '15mg'$  2-3 weeks  Hip exercises given  See me again in 4 weeks

## 2021-07-23 NOTE — Progress Notes (Signed)
He was seen  on 9 16 by sports medicine

## 2021-08-07 ENCOUNTER — Telehealth: Payer: Self-pay

## 2021-08-18 ENCOUNTER — Other Ambulatory Visit: Payer: Self-pay

## 2021-08-18 ENCOUNTER — Ambulatory Visit: Payer: BC Managed Care – PPO | Admitting: Sports Medicine

## 2021-08-18 VITALS — BP 122/80 | HR 58 | Ht 68.0 in | Wt 200.0 lb

## 2021-08-18 DIAGNOSIS — M25551 Pain in right hip: Secondary | ICD-10-CM | POA: Diagnosis not present

## 2021-08-18 DIAGNOSIS — M1611 Unilateral primary osteoarthritis, right hip: Secondary | ICD-10-CM

## 2021-08-18 NOTE — Progress Notes (Signed)
Brad Terry D.[place] Phone: [phone]   Assessment and Plan:     1. Primary osteoarthritis of right hip 2. Right hip pain -Acute on chronic, moderately improved, subsequent visit - Likely an acute, resolving flare of chronic osteoarthritis of right hip - Transition meloxicam to as needed use.  May use until completion and transition to Tylenol/NSAIDs OTC for maintenance - Advised to get OTC Voltaren gel to use as needed for topical pain - Continue HEP provided at previous visit    Pertinent previous records reviewed include none pertinent   Follow Up: As needed if no improvement or worsening of symptoms   Subjective:   I, Brad Terry, am serving as a scribe for Dr. Glennon Terry  Chief Complaint: Right hip and leg pain follow up  HPI:   07/21/21 Patient states he has lump on right lower calf continues and now pain from the lower leg that radiates up affects his ability to walk and exercise he walks 10 to 11 miles a day.  Has some discomfort in the hip area on the left. He did see maybe a dermatologist someone who injected hydrocortisone in the area and it did not make a difference in the past. Patient describes pain as shooting pain that goes up the R leg to the lower back. Patient has been having this pain for months. Patient is also having left side Rib pain that has been bothering him for about 2 weeks    Swelling: no Aggravates: walking  Treatments tried: tylenol  08/18/21 Patient states that the pain in the hip is not as pronounced as it was but can still feel the pain when he walks. Patient states he walk 7 miles this morning pain didn't start till he was at the peak.  Relevant Historical Information: None pertinent  Additional pertinent review of systems negative.   Current Outpatient Medications:    Ascorbic Acid (VITAMIN C) 1000 MG tablet, Take 1,000 mg by mouth daily.,  Disp: , Rfl:    cholecalciferol (VITAMIN D3) 25 MCG (1000 UNIT) tablet, Take 2,000 Units by mouth daily., Disp: , Rfl:    ELDERBERRY PO, Take by mouth., Disp: , Rfl:    hydrocortisone (ANUSOL-HC) 25 MG suppository, Place 1 suppository (25 mg total) rectally at bedtime. As needed, Disp: 12 suppository, Rfl: 1   levocetirizine (XYZAL) 5 MG tablet, Take 5 mg by mouth every evening., Disp: , Rfl:    meloxicam (MOBIC) 15 MG tablet, Take 1 tablet (15 mg total) by mouth daily., Disp: 30 tablet, Rfl: 0   Objective:     Vitals:   08/18/21 1134  BP: 122/80  Pulse: (!) 58  SpO2: 98%  Weight: 200 lb (90.7 kg)  Height: 5\' 8"  (1.727 m)      Body mass index is 30.41 kg/m.    Physical Exam:    General: awake, alert, and oriented no acute distress, nontoxic Skin: no suspicious lesions or rashes Neuro:sensation intact distally with no dificits, normal muscle tone, no atrophy, strength 5/5 in all tested lower ext groups Psych: normal mood and affect, speech clear  Right hip: No deformity, swelling or wasting ROM Fexion 90, ext 30, IR 45, ER 45 NTTP over the hip flexors, adductors, inguinal canal, pubic ramus, greater troch, glute musculature, si joint, lumbar spine Negative log roll with FROM Negative FABER Negative FADIR Negative Piriformis test Negative trendelenberg Gait normal    Electronically signed by:  Suezanne Jacquet  Gypsy Lore.[facility] 11:52 AM 08/18/21

## 2021-08-18 NOTE — Patient Instructions (Addendum)
Good to see you  Voltaren gel over the counter/at pharmacy. Use on areas that hurt  See me again as needed

## 2021-09-20 ENCOUNTER — Ambulatory Visit: Payer: BC Managed Care – PPO | Admitting: Psychiatry

## 2021-09-20 ENCOUNTER — Encounter: Payer: Self-pay | Admitting: Psychiatry

## 2021-09-20 ENCOUNTER — Other Ambulatory Visit: Payer: Self-pay

## 2021-09-20 VITALS — BP 114/70 | HR 59 | Ht 70.0 in | Wt 200.0 lb

## 2021-09-20 DIAGNOSIS — G43009 Migraine without aura, not intractable, without status migrainosus: Secondary | ICD-10-CM

## 2021-09-20 DIAGNOSIS — E237 Disorder of pituitary gland, unspecified: Secondary | ICD-10-CM | POA: Diagnosis not present

## 2021-09-20 DIAGNOSIS — M5481 Occipital neuralgia: Secondary | ICD-10-CM

## 2021-09-20 MED ORDER — LORAZEPAM 0.5 MG PO TABS: ORAL_TABLET | ORAL | 0 refills | Status: DC

## 2021-09-20 NOTE — Patient Instructions (Addendum)
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 Pain is often described as a sharp, shooting pain at the back of the head which travels to the scalp and behind the eye. Other symptoms include tenderness at the base of the head, neck pain, and a sensation of numbness, tingling, or crawling on the scalp. It is often associated with neck tension. When neck muscles are tight they can compress the nerve and cause irritation.   Treatment options include physical therapy for neck pain and tightness. We can also use medications for neuropathic pain (pain originating from a nerve) as well as muscle relaxers. We can also perform an occipital nerve block, which is an injection of steroids and a numbing medication in the back of the head. This can be performed every 3 months.

## 2021-09-20 NOTE — Progress Notes (Signed)
Referring:  Burnis Medin, MD [address] [place],  [place] [address]  PCP: Burnis Medin, MD  Neurology was asked to evaluate Brad Terry, a 65 year old male for a chief complaint of headaches.  Our recommendations of care will be communicated by shared medical record.    CC:  headaches  HPI:  Medical co-morbidities: osteoarthritis  The patient presents for evaluation of right sided headaches. They have been present off and on over the past 20 years, but have increased in frequency lately. Currently has headaches 3 days per week. They are described as shooting pain behind the eye which radiates to the back of the head. They are associated with blurred vision, photophobia, and nausea. Has seen flashing lights with his headaches 1-2 times. Headaches last for several hours until he goes to sleep. He prefers not to take medication and will typically just try to power through his headaches.  ESR 07/13/21 was normal.  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  LABS: BMP Latest Ref Rng & Units 07/13/2021 08/19/2019 03/20/2019  Glucose 70 - 99 mg/dL 82 79 100(H)  BUN 6 - 23 mg/dL _0 Creatinine 0.40 - 1.50 mg/dL 0.97 1.00 0.92  BUN/Creat Ratio 10 - 24 - - 13  Sodium 135 - 145 mEq/L 140 140 140  Potassium 3.5 - 5.1 mEq/L 3.8 3.8 4.1   Chloride 96 - 112 mEq/L 104 106 104  CO2 19 - 32 mEq/L _1 Calcium 8.4 - 10.5 mg/dL 8.7 9.3 8.8   CBC    Component Value Date/Time   WBC 3.4 (L) 07/13/2021 1008   RBC 4.34 07/13/2021 1008   HGB 12.7 (L) 07/13/2021 1008   HGB 12.1 (L) 03/20/2019 1115   HGB 13.2 11/29/2010 1432   HCT 38.6 (L) 07/13/2021 1008   HCT 36.5 (L) 03/20/2019 1115   HCT 39.2 11/29/2010 1432   PLT 98.0 (L) 07/13/2021 1008   PLT 108 (L) 11/29/2010 1432   MCV 88.9 07/13/2021 1008   MCV 90 03/20/2019 1115   MCV 85.2 11/29/2010 1432   MCH 29.9 03/20/2019 1115   MCH 28.7 11/29/2010 1432   MCHC 32.9 07/13/2021 1008   RDW 13.8 07/13/2021 1008   RDW 13.0 03/20/2019 1115   RDW 13.4 11/29/2010 1432   LYMPHSABS 1.3 07/13/2021 1008   LYMPHSABS 1.3 03/20/2019 1115   LYMPHSABS 1.4 11/29/2010 1432   MONOABS 0.2 07/13/2021 1008   MONOABS 0.3 11/29/2010 1432   EOSABS 0.1 07/13/2021 1008   EOSABS 0.1 03/20/2019 1115   BASOSABS 0.0 07/13/2021 1008   BASOSABS 0.0 03/20/2019 1115   BASOSABS 0.0 11/29/2010 1432      IMAGING:  MRI brain 2011: slightly enlarged pituitary gland compared to prior MRI  MRI C-spine 2011: mild degenerative changes  Imaging independently reviewed on September 20, 2021   Current Outpatient Medications on File Prior to Visit  Medication Sig Dispense Refill   Ascorbic Acid (VITAMIN C) 1000 MG tablet Take 1,000 mg by mouth daily.     cholecalciferol (VITAMIN D3) 25 MCG (1000 UNIT) tablet Take 2,000 Units by mouth daily.     ELDERBERRY PO Take by mouth. (Patient not taking: Reported on 09/20/2021)     hydrocortisone (ANUSOL-HC) 25 MG suppository Place 1 suppository (25 mg total) rectally at bedtime. As needed (Patient not taking: Reported on 09/20/2021) 12 suppository 1   levocetirizine (XYZAL) 5 MG tablet Take 5 mg by mouth every evening. (Patient not taking: Reported on 09/20/2021)     meloxicam (MOBIC) 15 MG tablet Take 1 tablet (15 mg total) by mouth daily. (Patient not taking:  Reported on 09/20/2021) 30 tablet 0   No current facility-administered medications on file prior to visit.     Allergies: Allergies  Allergen Reactions   Naproxen     Able to take aleve, tinnitus   Piroxicam     REACTION: tinnitus   Statins     Hx of se of 2 statins     Family History: Migraine or other headaches in the family:  no Aneurysms in a first degree relative:  no Brain tumors in the family:  no Other neurological illness in the family:   brother has Parkinson's disease  Past Medical History: Past Medical History:  Diagnosis Date   Allergy    CHEST PAIN UNSPECIFIED 01/04/2010   Qualifier: Diagnosis of  By: [name], MD, [name]     Diverticulosis    GERD (gastroesophageal reflux disease)    Headaches due to old head injury    Hyperlipidemia    Internal hemorrhoids    RECTAL BLEEDING 09/14/2010   Qualifier: Diagnosis of  By: Regis Bill MD, [name]    Renal cyst    Rotator cuff (capsule) sprain 05/27/2013   Right     URTICARIA, ALLERGIC 12/30/2007   Qualifier: Diagnosis of  By: [name] MD, [name]     Past Surgical History Past Surgical History:  Procedure Laterality Date   COLONOSCOPY  12/27/2009   Brodie-hyperplastic polyp   KNEE ARTHROSCOPY     right   under the chin surgery       Social History: Social History   Tobacco Use   Smoking status: Never   Smokeless tobacco: Never  Vaping Use   Vaping Use: Never used  Substance Use Topics   Alcohol use: No    Alcohol/week: 0.0 standard drinks   Drug use: No    ROS: Negative for fevers, chills. Positive for headaches. All other systems reviewed and negative unless stated otherwise in HPI.   Physical Exam:   Vital Signs: BP 114/70   Pulse (!) 59   Ht _0  (1.778 m)   Wt 200 lb (90.7 kg)   BMI 28.70 kg/m  GENERAL: well appearing,in no acute distress,alert SKIN:  Color, texture, turgor normal. No rashes or lesions HEAD:  Normocephalic/atraumatic. CV:  RRR RESP: Normal respiratory effort MSK:  no tenderness to palpation over occiput, neck, or shoulders, limited cervical ROM when turning head to the right  NEUROLOGICAL: Mental Status: Alert, oriented to person, place and time,Follows commands Cranial Nerves: PERRL,visual fields intact to confrontation,extraocular movements intact, decreased sensation to light touch over right V1,no facial droop or ptosis,hearing intact to finger rub bilaterally,no dysarthria,palate elevate symmetrically,tongue protrudes midline,shoulder shrug  intact and symmetric Motor: muscle strength 5/5 both upper and lower extremities,no drift, normal tone Reflexes: 2+ throughout Sensation: intact to light touch all 4 extremities Coordination: Finger-to- nose-finger intact bilaterally,Heel-to-shin intact bilaterally Gait: normal-based   IMPRESSION: 65 year old male who presents for evaluation of right sided headaches. Headaches do have migrainous features as well as features of right occipital neuralgia. Exam today is significant for decreased sensation over right V1. He had a prior MRI in 2011 which showed an enlarged pituitary gland when compared to MRI in 2008. Will order repeat MRI today given sensory deficit on exam. Discussed treatment options including medications, physical therapy, and occipital nerve block. He would prefer to have the MRI done before deciding on any treatments at this time.  PLAN: -MRI brain -next steps: consider neck PT, nerve block   I spent a total of 41 minutes chart reviewing and counseling the patient. Headache education was done. Discussed treatment options including preventive and acute medications, natural supplements, and physical therapy. Discussed medication side effects, adverse reactions and drug interactions. Written educational materials and patient instructions outlining all of the above were given.  Follow-up: after MRI   Genia Harold, MD 09/20/2021   2:40 PM

## 2021-10-03 ENCOUNTER — Telehealth: Payer: Self-pay | Admitting: Psychiatry

## 2021-10-03 NOTE — Telephone Encounter (Signed)
LVM for pt to call back to schedule  [employer] auth: [id number] (exp. 10/02/21 to 10/31/21)

## 2021-10-11 ENCOUNTER — Ambulatory Visit: Payer: BC Managed Care – PPO | Admitting: Neurology

## 2021-11-15 NOTE — Telephone Encounter (Signed)
updated Brad Terry: [id number] (exp. 11/15/21 to 12/14/21)Medicare order sent to [facility]. They will reach out to the patient to schedule.

## 2021-11-20 ENCOUNTER — Ambulatory Visit: Payer: BC Managed Care – PPO | Admitting: Neurology

## 2021-11-27 ENCOUNTER — Other Ambulatory Visit: Payer: Self-pay

## 2021-11-27 ENCOUNTER — Ambulatory Visit
Admission: RE | Admit: 2021-11-27 | Discharge: 2021-11-27 | Disposition: A | Discharge status: Home / Self Care | Payer: Medicare Other | Source: Ambulatory Visit | Attending: Psychiatry | Admitting: Psychiatry

## 2021-11-27 DIAGNOSIS — E237 Disorder of pituitary gland, unspecified: Secondary | ICD-10-CM | POA: Diagnosis not present

## 2021-11-27 MED ORDER — GADOBENATE DIMEGLUMINE 529 MG/ML IV SOLN
9.0000 mL | Freq: Once | INTRAVENOUS | Status: AC | PRN
  Administered 2021-11-27: 9 mL via INTRAVENOUS

## 2021-12-19 ENCOUNTER — Ambulatory Visit: Payer: BC Managed Care – PPO | Admitting: Neurology

## 2021-12-25 ENCOUNTER — Ambulatory Visit (INDEPENDENT_AMBULATORY_CARE_PROVIDER_SITE_OTHER): Payer: Medicare Other | Admitting: Family Medicine

## 2021-12-25 ENCOUNTER — Ambulatory Visit: Payer: Medicare Other | Admitting: Family Medicine

## 2021-12-25 ENCOUNTER — Encounter: Payer: Self-pay | Admitting: Family Medicine

## 2021-12-25 VITALS — BP 120/70 | HR 71 | Resp 16 | Ht 70.0 in | Wt 206.1 lb

## 2021-12-25 DIAGNOSIS — L509 Urticaria, unspecified: Secondary | ICD-10-CM

## 2021-12-25 DIAGNOSIS — R1310 Dysphagia, unspecified: Secondary | ICD-10-CM | POA: Diagnosis not present

## 2021-12-25 DIAGNOSIS — K219 Gastro-esophageal reflux disease without esophagitis: Secondary | ICD-10-CM | POA: Diagnosis not present

## 2021-12-25 MED ORDER — FAMOTIDINE 20 MG PO TABS: 20.0000 mg | ORAL_TABLET | Freq: Two times a day (BID) | ORAL | 0 refills | Status: DC

## 2021-12-25 MED ORDER — OMEPRAZOLE 40 MG PO CPDR: 40.0000 mg | DELAYED_RELEASE_CAPSULE | Freq: Every day | ORAL | 1 refills | Status: DC

## 2021-12-25 MED ORDER — PREDNISONE 20 MG PO TABS: 40.0000 mg | ORAL_TABLET | Freq: Every day | ORAL | 0 refills | Status: AC

## 2021-12-25 NOTE — Progress Notes (Signed)
ACUTE VISIT Chief Complaint  Patient presents with   Rash    Took tylenol & then broke out in a rash. Has tried using hydrocortisone with no relief.    trouble swallowing   HPI: [name] is a 66 y.o. male with hx of HLD,OA,and anemia here today complaining of pruritic rash as described above. He is reporting similar episodes intermittently for the past 3-4 years, problem was attributed to Ibuprofen. Very pruritic skin rash started again 3 days ago a day after he took Tylenol for body aches. No associated facial lesions or edema. Rash This is a new problem. The current episode started yesterday. The affected locations include the chest, scalp, abdomen, head, back and torso. The rash is characterized by itchiness. He was exposed to a new medication. Associated symptoms include congestion and fatigue. Pertinent negatives include no anorexia, diarrhea, eye pain, facial edema, fever, joint pain, nail changes, sore throat or vomiting. Past treatments include anti-itch cream. The treatment provided no relief. His past medical history is significant for allergies. There is no history of asthma, eczema or varicella.  Pruritus interferes with sleep. A few days ago he had a cold, reports negative home COVID-19 test. Symptoms are improving. Still having some nasal congestion and rhinorrhea. Occasional cough, negative for dyspnea and wheezing. He takes Xyzal 5 mg daily for allergies.  She is also reporting difficulty swallowing. For the past week or so he feels mid anterior chest discomfort, like water and food get stuck and "go down slowly." + Belching. Symptoms exacerbated by oral intake. He has not noted heartburn, abdominal pain, nausea, vomiting, changes in bowel habits, or melena. He tried OTC Pepto-Bismol. EGD in 05/2010, which was ordered because chest pain when he was eating.  It was otherwise negative, at that time Nexium samples were provided. Negative for exertional  CP,DOE,palpitations,or diaphoresis.  Review of Systems  Constitutional:  Positive for fatigue. Negative for chills and fever.  HENT:  Positive for congestion. Negative for mouth sores, nosebleeds and sore throat.   Eyes:  Negative for pain and redness.  Respiratory:  Negative for choking and stridor.   Cardiovascular:  Negative for leg swelling.  Gastrointestinal:  Negative for anorexia, diarrhea and vomiting.  Genitourinary:  Negative for decreased urine volume, dysuria and hematuria.  Musculoskeletal:  Negative for joint pain.  Skin:  Positive for rash. Negative for nail changes and wound.  Allergic/Immunologic: Positive for environmental allergies.  Neurological:  Negative for syncope and weakness.  Psychiatric/Behavioral:  Positive for sleep disturbance. Negative for confusion.   Rest see pertinent positives and negatives per HPI.  Current Outpatient Medications on File Prior to Visit  Medication Sig Dispense Refill   Ascorbic Acid (VITAMIN C) 1000 MG tablet Take 1,000 mg by mouth daily.     cholecalciferol (VITAMIN D3) 25 MCG (1000 UNIT) tablet Take 2,000 Units by mouth daily.     No current facility-administered medications on file prior to visit.   Past Medical History:  Diagnosis Date   Allergy    CHEST PAIN UNSPECIFIED 01/04/2010   Qualifier: Diagnosis of  By: [name], MD, [name]     Diverticulosis    GERD (gastroesophageal reflux disease)    Headaches due to old head injury    Hyperlipidemia    Internal hemorrhoids    RECTAL BLEEDING 09/14/2010   Qualifier: Diagnosis of  By: Regis Bill MD, [name]    Renal cyst    Rotator cuff (capsule) sprain 05/27/2013   Right  URTICARIA, ALLERGIC 12/30/2007   Qualifier: Diagnosis of  By: [name] MD, [name]    Allergies  Allergen Reactions   Naproxen     Able to take aleve, tinnitus   Piroxicam     REACTION: tinnitus   Statins     Hx of se of 2 statins     Social History   Socioeconomic History   Marital status: Married     Spouse name: [name]   Number of children: Not on file   Years of education: Not on file   Highest education level: Not on file  Occupational History   Not on file  Tobacco Use   Smoking status: Never   Smokeless tobacco: Never  Vaping Use   Vaping Use: Never used  Substance and Sexual Activity   Alcohol use: No    Alcohol/week: 0.0 standard drinks   Drug use: No   Sexual activity: Not on file  Other Topics Concern   Not on file  Social History Narrative   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   Tries to walk with exercise      [place] origin      Caffeine- seldom    Social Determinants of Radio broadcast assistant Strain: Not on file  Food Insecurity: Not on file  Transportation Needs: Not on file  Physical Activity: Not on file  Stress: Not on file  Social Connections: Not on file   Vitals:   12/25/21 1124  BP: 120/70  Pulse: 71  Resp: 16  SpO2: 97%   Body mass index is 29.58 kg/m.  Physical Exam Vitals and nursing note reviewed.  Constitutional:      General: He is not in acute distress.    Appearance: He is well-developed.  HENT:     Head: Normocephalic and atraumatic.     Nose: Rhinorrhea present.     Mouth/Throat:     Mouth: Mucous membranes are moist.     Pharynx: Oropharynx is clear.  Eyes:     Conjunctiva/sclera: Conjunctivae normal.  Neck:     Trachea: No tracheal deviation.  Cardiovascular:     Rate and Rhythm: Normal rate and regular rhythm.  Pulmonary:     Effort: Pulmonary effort is normal. No respiratory distress.     Breath sounds: Normal breath sounds.  Abdominal:     Palpations: Abdomen is soft. There is no mass.     Tenderness: There is no abdominal tenderness.  Musculoskeletal:     Right lower leg: No edema.     Left lower leg: No edema.  Lymphadenopathy:     Cervical: No cervical adenopathy.  Skin:    General: Skin is warm.     Findings: Rash present. No erythema. Rash is  urticarial. Rash is not vesicular.     Comments: Raised confluent, erythematous lesions on upper and lower back, chest,and abdomen. Also lesions on neck and low occipital.  Neurological:     General: No focal deficit present.     Mental Status: He is alert and oriented to person, place, and time.     Gait: Gait normal.  Psychiatric:     Comments: Well groomed, good eye contact.   ASSESSMENT AND PLAN:  Mr. Kunio was seen today for rash and trouble swallowing.  Diagnoses and all orders for this visit:  Dysphagia, unspecified type We discussed possible etiologies. Instructed about warning signs.  Urticaria He has had problem before. We discussed possible  causes. For now avoid taking Tylenol. OTC sarna lotion may help. Because of the extension of involvement, recommend oral prednisone.  We discussed some side effects. Recommend prednisone 40 mg with breakfast. Pepcid 20 mg twice daily may also help. Continue Xyzal 5 mg daily. Clearly instructed about warning signs.  -     predniSONE (DELTASONE) 20 MG tablet; Take 2 tablets (40 mg total) by mouth daily with breakfast for 5 days. -     famotidine (PEPCID) 20 MG tablet; Take 1 tablet (20 mg total) by mouth 2 (two) times daily.  Gastroesophageal reflux disease, unspecified whether esophagitis present This could be causing chest discomfort with swallowing and dysphagia. Recommend omeprazole 40 mg daily and Pepcid 20 mg twice daily. We discussed some side effects. GERD precautions recommended. Follow-up with PCP in 6 weeks, before if needed.  -     famotidine (PEPCID) 20 MG tablet; Take 1 tablet (20 mg total) by mouth 2 (two) times daily. -     omeprazole (PRILOSEC) 40 MG capsule; Take 1 capsule (40 mg total) by mouth daily.  Return in about 6 weeks (around 02/05/2022) for PCP.  Easter Kennebrew G. Martinique, MD  [facility]. [place] office.

## 2021-12-25 NOTE — Progress Notes (Unsigned)
ACUTE VISIT No chief complaint on file.  HPI: [name] is a 66 y.o. male, who is here today complaining of *** HPI  Review of Systems Rest see pertinent positives and negatives per HPI.  Current Outpatient Medications on File Prior to Visit  Medication Sig Dispense Refill   Ascorbic Acid (VITAMIN C) 1000 MG tablet Take 1,000 mg by mouth daily.     cholecalciferol (VITAMIN D3) 25 MCG (1000 UNIT) tablet Take 2,000 Units by mouth daily.     ELDERBERRY PO Take by mouth. (Patient not taking: Reported on 09/20/2021)     hydrocortisone (ANUSOL-HC) 25 MG suppository Place 1 suppository (25 mg total) rectally at bedtime. As needed (Patient not taking: Reported on 09/20/2021) 12 suppository 1   levocetirizine (XYZAL) 5 MG tablet Take 5 mg by mouth every evening. (Patient not taking: Reported on 09/20/2021)     LORazepam (ATIVAN) 0.5 MG tablet Take one pill an hour before MRI. If still anxious after 30 minutes can take second pill 2 tablet 0   meloxicam (MOBIC) 15 MG tablet Take 1 tablet (15 mg total) by mouth daily. (Patient not taking: Reported on 09/20/2021) 30 tablet 0   No current facility-administered medications on file prior to visit.     Past Medical History:  Diagnosis Date   Allergy    CHEST PAIN UNSPECIFIED 01/04/2010   Qualifier: Diagnosis of  By: [name], MD, [name]     Diverticulosis    GERD (gastroesophageal reflux disease)    Headaches due to old head injury    Hyperlipidemia    Internal hemorrhoids    RECTAL BLEEDING 09/14/2010   Qualifier: Diagnosis of  By: Regis Bill MD, [name]    Renal cyst    Rotator cuff (capsule) sprain 05/27/2013   Right     URTICARIA, ALLERGIC 12/30/2007   Qualifier: Diagnosis of  By: [name] MD, [name]    Allergies  Allergen Reactions   Naproxen     Able to take aleve, tinnitus   Piroxicam     REACTION: tinnitus   Statins     Hx of se of 2 statins     Social History   Socioeconomic History   Marital status:  Married    Spouse name: [name]   Number of children: Not on file   Years of education: Not on file   Highest education level: Not on file  Occupational History   Not on file  Tobacco Use   Smoking status: Never   Smokeless tobacco: Never  Vaping Use   Vaping Use: Never used  Substance and Sexual Activity   Alcohol use: No    Alcohol/week: 0.0 standard drinks   Drug use: No   Sexual activity: Not on file  Other Topics Concern   Not on file  Social History Narrative   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   Tries to walk with exercise      [place] country of origin      Caffeine- seldom    Social Determinants of Radio broadcast assistant Strain: Not on file  Food Insecurity: Not on file  Transportation Needs: Not on file  Physical Activity: Not on file  Stress: Not on file  Social Connections: Not on file    There were no vitals filed for this visit. There is no height or weight on file to calculate BMI.  Physical Exam  ASSESSMENT AND PLAN:  There are no diagnoses  linked to this encounter.   No follow-ups on file.   Betty G. Martinique, MD  [facility]. [place] office.  Discharge Instructions   None

## 2021-12-25 NOTE — Patient Instructions (Addendum)
A few things to remember from today's visit:   Urticaria - Plan: predniSONE (DELTASONE) 20 MG tablet, famotidine (PEPCID) 20 MG tablet  Dysphagia, unspecified type  Gastroesophageal reflux disease, unspecified whether esophagitis present - Plan: famotidine (PEPCID) 20 MG tablet, omeprazole (PRILOSEC) 40 MG capsule  If you need refills please call your pharmacy. Do not use My Chart to request refills or for acute issues that need immediate attention.   Pepcid 20 mg 2 times daily and Omeprazole 30 min before breakfast. Continue Xyzal. Prednisone 40 mg with breakfast for 5 days.  Please be sure medication list is accurate. If a new problem present, please set up appointment sooner than planned today. Hives Hives (urticaria) are itchy, red, swollen areas on the skin. Hives can appear on any part of the body. Hives often fade within 24 hours (acute hives). Sometimes, new hives appear after old ones fade and the cycle can continue for several days or weeks (chronic hives). Hives do not spread from person to person (are not contagious). Hives come from the body's reaction to something a person is allergic to (allergen), something that causes irritation, or various other triggers. When a person is exposed to a trigger, his or her body releases a chemical (histamine) that causes redness, itching, and swelling. Hives can appear right after exposure to a trigger or hours later. What are the causes? This condition may be caused by: Allergies to foods or ingredients. Insect bites or stings. Exposure to pollen or pets. Spending time in sunlight, heat, or cold (exposure). Exercise. Stress. You can also get hives from other medical conditions and treatments, such as: Viruses, including the common cold. Bacterial infections, such as urinary tract infections and strep throat. Certain medicines. Contact with latex or chemicals. Allergy shots. Blood transfusions. Sometimes, the cause of this  condition is not known (idiopathic hives). What increases the risk? You are more likely to develop this condition if you: Are a woman. Have food allergies, especially to citrus fruits, milk, eggs, peanuts, tree nuts, or shellfish. Are allergic to: Medicines. Latex. Insects. Animals. Pollen. What are the signs or symptoms? Common symptoms of this condition include raised, itchy, red or white bumps or patches on your skin. These areas may: Become large and swollen (welts). Change in shape and location, quickly and repeatedly. Be separate hives or connect over a large area of skin. Sting or become painful. Turn white when pressed in the center (blanch). In severe cases, your hands, feet, and face may also become swollen. This may occur if hives develop deeper in your skin. How is this diagnosed? This condition may be diagnosed by your symptoms, medical history, and physical exam. Your skin, urine, or blood may be tested to find out what is causing your hives and to rule out other health issues. Your health care provider may also remove a small sample of skin from the affected area and examine it under a microscope (biopsy). How is this treated? Treatment for this condition depends on the cause and severity of your symptoms. Your health care provider may recommend using cool, wet cloths (cool compresses) or taking cool showers to relieve itching. Treatment may include: Medicines that help: Relieve itching (antihistamines). Reduce swelling (corticosteroids). Treat infection (antibiotics). An injectable medicine (omalizumab). Your health care provider may prescribe this if you have chronic idiopathic hives and you continue to have symptoms even after treatment with antihistamines. Severe cases may require an emergency injection of adrenaline (epinephrine) to prevent a life-threatening allergic reaction (anaphylaxis). Follow  these instructions at home: Medicines Take and apply  over-the-counter and prescription medicines only as told by your health care provider. If you were prescribed an antibiotic medicine, take it as told by your health care provider. Do not stop using the antibiotic even if you start to feel better. Skin care Apply cool compresses to the affected areas. Do not scratch or rub your skin. General instructions Do not take hot showers or baths. This can make itching worse. Do not wear tight-fitting clothing. Use sunscreen and wear protective clothing when you are outside. Avoid any substances that cause your hives. Keep a journal to help track what causes your hives. Write down: What medicines you take. What you eat and drink. What products you use on your skin. Keep all follow-up visits as told by your health care provider. This is important. Contact a health care provider if: Your symptoms are not controlled with medicine. Your joints are painful or swollen. Get help right away if: You have a fever. You have pain in your abdomen. Your tongue or lips are swollen. Your eyelids are swollen. Your chest or throat feels tight. You have trouble breathing or swallowing. These symptoms may represent a serious problem that is an emergency. Do not wait to see if the symptoms will go away. Get medical help right away. Call your local emergency services (911 in the [place] Do not drive yourself to the hospital. Summary Hives (urticaria) are itchy, red, swollen areas on your skin. Hives come from the body's reaction to something a person is allergic to (allergen), something that causes irritation, or various other triggers. Treatment for this condition depends on the cause and severity of your symptoms. Avoid any substances that cause your hives. Keep a journal to help track what causes your hives. Take and apply over-the-counter and prescription medicines only as told by your health care provider. Get help right away if your chest or throat feels tight or  if you have trouble breathing or swallowing. This information is not intended to replace advice given to you by your health care provider. Make sure you discuss any questions you have with your health care provider. Document Revised: 12/11/2020 Document Reviewed: 12/11/2020 Elsevier Patient Education  [employer].

## 2022-01-26 ENCOUNTER — Other Ambulatory Visit: Payer: Self-pay

## 2022-01-26 ENCOUNTER — Ambulatory Visit (INDEPENDENT_AMBULATORY_CARE_PROVIDER_SITE_OTHER): Payer: Medicare Other

## 2022-01-26 ENCOUNTER — Ambulatory Visit (INDEPENDENT_AMBULATORY_CARE_PROVIDER_SITE_OTHER): Payer: Medicare Other | Admitting: Sports Medicine

## 2022-01-26 VITALS — BP 130/80 | HR 67 | Ht 70.0 in | Wt 206.0 lb

## 2022-01-26 DIAGNOSIS — M25561 Pain in right knee: Secondary | ICD-10-CM

## 2022-01-26 DIAGNOSIS — G8929 Other chronic pain: Secondary | ICD-10-CM

## 2022-01-26 DIAGNOSIS — M25551 Pain in right hip: Secondary | ICD-10-CM

## 2022-01-26 DIAGNOSIS — M1611 Unilateral primary osteoarthritis, right hip: Secondary | ICD-10-CM | POA: Diagnosis not present

## 2022-01-26 DIAGNOSIS — M1711 Unilateral primary osteoarthritis, right knee: Secondary | ICD-10-CM | POA: Diagnosis not present

## 2022-01-26 NOTE — Patient Instructions (Addendum)
Good to see you  ?Can use Voltaren gel as needed for pain  ?3 week follow up  ?

## 2022-01-26 NOTE — Progress Notes (Signed)
? ? Brad Terry D.Merril Abbe ?[facility] ?[place] ?Phone: [phone] ?  ?Assessment and Plan:   ?  ?1. Osteoarthritis of right knee, unspecified osteoarthritis type ?2. Chronic pain of right knee ?3. Primary osteoarthritis of right hip ?4. Right hip pain ?-Chronic with exacerbation, subsequent sports medicine visit ?- Recurrence and right leg pain with most prominent pain being right knee at this time, with history of right knee arthroscopy >20 years ago ?- X-ray obtained in clinic.  My interpretation: No acute fracture or dislocation.  Mild tricompartmental degeneration most significant in the lateral compartment with mild cortical changes ?- Patient elected for CSI of right knee.  Tolerated well per note below ?- Start HEP for knee ?- Patient states that he broke out in welts after using ibuprofen and Tylenol, so can use Voltaren gel as needed for pain relief ? ?Procedure: Knee Joint Injection ?Side: Right ?Indication: Acute flare of right knee osteoarthritis ? ?Risks explained and consent was given verbally. The site was cleaned with alcohol prep. A needle was introduced with an anterio-lateral approach. Injection given using 28m of 1% lidocaine without epinephrine and 168mof kenalog '40mg'$ /ml. This was well tolerated and resulted in symptomatic relief.  Needle was removed, hemostasis achieved, and post injection instructions were explained.   Pt was advised to call or return to clinic if these symptoms worsen or fail to improve as anticipated.  ? ?Pertinent previous records reviewed include none ?  ?Follow Up: 3 weeks for reevaluation.  Could consider PT versus HA injection in knee if no improvement.  If improving, would address hip pain to see if that is improved as knee pain has improved ?  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 ?HPI:  ?07/21/21 ?Patient states he has lump on  right lower calf continues and now pain from the lower leg that radiates up affects his ability to walk and exercise he walks 10 to 11 miles a day.  Has some discomfort in the hip area on the left. ?He did see maybe a dermatologist someone who injected hydrocortisone in the area and it did not make a difference in the past. Patient describes pain as shooting pain that goes up the R leg to the lower back. Patient has been having this pain for months. Patient is also having left side Rib pain that has been bothering him for about 2 weeks  ?  ?Swelling: no ?Aggravates: walking  ?Treatments tried: tylenol ?  ?08/18/21 ?Patient states that the pain in the hip is not as pronounced as it was but can still feel the pain when he walks. Patient states he walk 7 miles this morning pain didn't start till he was at the peak. ? ?01/26/2022 ?Patient states that his right medial knee is still bothering him piercing sensation that shoots up affecting his whole right side up to his right hip , got worse this past week walks a lot  after long walk the pain comes on strongly. No numbness or tingling , has not taken any meds the tylenol broke him out last time he took it ,has a piercing pain behind his right eye  ? ? ?Relevant Historical Information: None pertinent ? ?Additional pertinent review of systems negative. ? ? ?Current Outpatient Medications:  ?  Ascorbic Acid (VITAMIN C) 1000 MG tablet, Take 1,000 mg by mouth daily., Disp: , Rfl:  ?  cholecalciferol (VITAMIN D3) 25 MCG (1000 UNIT) tablet, Take 2,000 Units by mouth daily., Disp: , Rfl:  ?  omeprazole (PRILOSEC) 40 MG capsule, Take 1 capsule (40 mg total) by mouth daily., Disp: 30 capsule, Rfl: 1 ?  famotidine (PEPCID) 20 MG tablet, Take 1 tablet (20 mg total) by mouth 2 (two) times daily., Disp: 60 tablet, Rfl: 0  ? ?Objective:   ?  ?Vitals:  ? 01/26/22 1021  ?BP: 130/80  ?Pulse: 67  ?SpO2: 97%  ?Weight: 206 lb (93.4 kg)  ?Height: '5\' 10"'$  (1.778 m)  ?  ?  ?Body mass index is  29.56 kg/m?.  ?  ?Physical Exam:   ? ?General:  awake, alert oriented, no acute distress nontoxic ?Skin: no suspicious lesions or rashes ?Neuro:sensation intact, no deficits, strength 5/5 with no deficits, no atrophy, normal muscle tone ?Psych: No signs of anxiety, depression or other mood disorder ? ?Right knee: ?No swelling ?No deformity ?Neg fluid wave, joint milking ?Crepitus ?ROM Flex 100, Ext 10 ?NTTP over the quad tendon, medial fem condyle, lat fem condyle, patella, plica, patella tendon, tibial tuberostiy, fibular head, posterior fossa, pes anserine bursa, gerdy's tubercle, medial jt line, lateral jt line ?Neg anterior and posterior drawer ?Neg lachman ?Neg sag sign ?Negative varus stress ?Negative valgus stress ?Negative McMurray ?Negative Thessaly ? ?Gait normal  ? ? ?Electronically signed by:  ?Brad Terry D.Merril Abbe ?[facility] ?10:38 AM 01/26/22 ?

## 2022-02-15 NOTE — Progress Notes (Deleted)
? ?   Benito Mccreedy D.Merril Abbe ?[facility] ?[place] ?Phone: [phone] ?  ?Assessment and Plan:   ?  ?There are no diagnoses linked to this encounter.  ?*** ?  ?Pertinent previous records reviewed include *** ?  ?Follow Up: ***  ? ?  ?Subjective:   ?I, [name], am serving as a Education administrator for [employer] ?  ?Chief Complaint: leg pain  ?  ?HPI:  ?07/21/21 ?Patient states he has lump on right lower calf continues and now pain from the lower leg that radiates up affects his ability to walk and exercise he walks 10 to 11 miles a day.  Has some discomfort in the hip area on the left. ?He did see maybe a dermatologist someone who injected hydrocortisone in the area and it did not make a difference in the past. Patient describes pain as shooting pain that goes up the R leg to the lower back. Patient has been having this pain for months. Patient is also having left side Rib pain that has been bothering him for about 2 weeks  ?  ?Swelling: no ?Aggravates: walking  ?Treatments tried: tylenol ?  ?08/18/21 ?Patient states that the pain in the hip is not as pronounced as it was but can still feel the pain when he walks. Patient states he walk 7 miles this morning pain didn't start till he was at the peak. ?  ?01/26/2022 ?Patient states that his right medial knee is still bothering him piercing sensation that shoots up affecting his whole right side up to his right hip , got worse this past week walks a lot  after long walk the pain comes on strongly. No numbness or tingling , has not taken any meds the tylenol broke him out last time he took it ,has a piercing pain behind his right eye  ? ?02/16/2022 ?Patient states ? ?Additional pertinent review of systems negative. ? ? ?Current Outpatient Medications:  ?  Ascorbic Acid (VITAMIN C) 1000 MG tablet, Take 1,000 mg by mouth daily., Disp: , Rfl:  ?  cholecalciferol (VITAMIN D3) 25 MCG (1000 UNIT) tablet, Take 2,000  Units by mouth daily., Disp: , Rfl:  ?  famotidine (PEPCID) 20 MG tablet, Take 1 tablet (20 mg total) by mouth 2 (two) times daily., Disp: 60 tablet, Rfl: 0 ?  omeprazole (PRILOSEC) 40 MG capsule, Take 1 capsule (40 mg total) by mouth daily., Disp: 30 capsule, Rfl: 1  ? ?Objective:   ?  ?There were no vitals filed for this visit.  ?  ?There is no height or weight on file to calculate BMI.  ?  ?Physical Exam:   ? ?*** ? ? ?Electronically signed by:  ?Benito Mccreedy D.Merril Abbe ?[facility] ?10:42 AM 02/15/22 ?

## 2022-02-16 ENCOUNTER — Ambulatory Visit: Payer: Medicare Other | Admitting: Sports Medicine

## 2022-02-16 NOTE — Progress Notes (Signed)
? ? Brad Terry D.Merril Abbe ?[facility] ?[place] ?Phone: [phone] ?  ?Assessment and Plan:   ? ?1. Osteoarthritis of right knee, unspecified osteoarthritis type ?2. Chronic pain of right knee ?-Chronic with exacerbation, subsequent visit ?- Significant relief and chronic right knee pain after intra-articular CSI at previous office visit ?- Could repeat CSI if needed, though would wait ideally 3 months from prior injection on 01/26/2022 ? ?3. Primary osteoarthritis of right hip ?4. Right hip pain ?-Chronic with exacerbation, subsequent visit ?- Mild improvement in right hip pain after corticosteroid injection to right knee, though still having daily hip pain that limits his physical activity ?- Offered intra-articular CSI at today's visit, the patient wishes to discuss this with his wife first ?- Start Voltaren gel over painful areas ?- Continue HEP ?  ?Pertinent previous records reviewed include none ?  ?Follow Up: 3 to 4 weeks for reevaluation.  Could consider intra-articular right hip injection if patient wishes to proceed ?  ?Subjective:   ?I, Brad Terry, am serving as a Education administrator for Doctor [employer] ?  ?Chief Complaint: leg pain  ?  ?HPI:  ?07/21/21 ?Patient states he has lump on right lower calf continues and now pain from the lower leg that radiates up affects his ability to walk and exercise he walks 10 to 11 miles a day.  Has some discomfort in the hip area on the left. ?He did see maybe a dermatologist someone who injected hydrocortisone in the area and it did not make a difference in the past. Patient describes pain as shooting pain that goes up the R leg to the lower back. Patient has been having this pain for months. Patient is also having left side Rib pain that has been bothering him for about 2 weeks  ?  ?Swelling: no ?Aggravates: walking  ?Treatments tried: tylenol ?  ?08/18/21 ?Patient states that the pain in the hip is not as  pronounced as it was but can still feel the pain when he walks. Patient states he walk 7 miles this morning pain didn't start till he was at the peak. ?  ?01/26/2022 ?Patient states that his right medial knee is still bothering him piercing sensation that shoots up affecting his whole right side up to his right hip , got worse this past week walks a lot  after long walk the pain comes on strongly. No numbness or tingling , has not taken any meds the tylenol broke him out last time he took it ,has a piercing pain behind his right eye  ? ?02/19/2022 ?Patient states that the knee pain is pretty much gone the pain is not any where like it was. Patient states that the R hip is still painful especially after his daily walks. Patient states that the actual walking helps with the pain but once he settles down for the night that pain is worse.  ? ?Additional pertinent review of systems negative. ? ? ?Current Outpatient Medications:  ?  Ascorbic Acid (VITAMIN C) 1000 MG tablet, Take 1,000 mg by mouth daily., Disp: , Rfl:  ?  cholecalciferol (VITAMIN D3) 25 MCG (1000 UNIT) tablet, Take 2,000 Units by mouth daily., Disp: , Rfl:  ?  omeprazole (PRILOSEC) 40 MG capsule, Take 1 capsule (40 mg total) by mouth daily., Disp: 30 capsule, Rfl: 1 ?  famotidine (PEPCID) 20 MG tablet, Take 1 tablet (20 mg total) by mouth 2 (two) times daily., Disp: 60 tablet, Rfl: 0  ? ?  Objective:   ?  ?Vitals:  ? 02/19/22 1427  ?BP: 120/70  ?Pulse: 71  ?SpO2: 98%  ?Weight: 210 lb (95.3 kg)  ?Height: '5\' 10"'$  (1.778 m)  ?  ?  ?Body mass index is 30.13 kg/m?.  ?  ?Physical Exam:   ? ?General: awake, alert, and oriented no acute distress, nontoxic ?Skin: no suspicious lesions or rashes ?Neuro:sensation intact distally with no dificits, normal muscle tone, no atrophy, strength 5/5 in all tested lower ext groups ?Psych: normal mood and affect, speech clear ? ?Right hip: ?No deformity, swelling or wasting ?ROM Flexion 90, ext 30, IR 45, ER 45 ?NTTP over the hip  flexors, greater troch, glute musculature, si joint, lumbar spine ?Negative log roll with FROM ?Positive FABER ?Positive FADIR ?Negative Piriformis test ?Negative trendelenberg ?Gait normal  ? ? ?Electronically signed by:  ?Brad [facility] D.Merril Abbe ?[facility] ?3:18 PM 02/19/22 ?

## 2022-02-19 ENCOUNTER — Ambulatory Visit (INDEPENDENT_AMBULATORY_CARE_PROVIDER_SITE_OTHER): Payer: Medicare Other | Admitting: Sports Medicine

## 2022-02-19 VITALS — BP 120/70 | HR 71 | Ht 70.0 in | Wt 210.0 lb

## 2022-02-19 DIAGNOSIS — M25551 Pain in right hip: Secondary | ICD-10-CM | POA: Diagnosis not present

## 2022-02-19 DIAGNOSIS — M25561 Pain in right knee: Secondary | ICD-10-CM | POA: Diagnosis not present

## 2022-02-19 DIAGNOSIS — G8929 Other chronic pain: Secondary | ICD-10-CM

## 2022-02-19 DIAGNOSIS — M1711 Unilateral primary osteoarthritis, right knee: Secondary | ICD-10-CM

## 2022-02-19 DIAGNOSIS — M1611 Unilateral primary osteoarthritis, right hip: Secondary | ICD-10-CM

## 2022-02-19 NOTE — Patient Instructions (Addendum)
Good to see you  ?Hip home exercises given ?Voltaren gel over painful areas  ?Follow up in 2-4 weeks  ?

## 2022-03-06 NOTE — Progress Notes (Signed)
? ? Brad Terry D.Merril Abbe ?[facility] ?[place] ?Phone: [phone] ?  ?Assessment and Plan:   ?  ?1. Primary osteoarthritis of right hip ?2. Right hip pain ?-Chronic with exacerbation, subsequent visit ?- Continued right hip pain that worsens after physical activity and x-ray images consistent with mild hip osteoarthritis ?- Patient has not improved with conservative therapy and elects for CSI at today's visit.  Tolerated well per note below ? ?Procedure: Ultrasound Guided Hip Acetabulofemoral Joint Injection ?Side: Right ?Diagnosis: Acute flare of osteoarthritis ?[place] Indication:  ?- accuracy is paramount for diagnosis ?- to ensure therapeutic efficacy or procedural success ?- to reduce procedural risk ? ?After explaining the procedure, viable alternatives, risks, and answering any questions, consent was given verbally. The site was cleaned with chlorhexidine prep. An ultrasound transducer was placed on the anterior thigh/hip.   The acetabular joint, labrum, and femoral shaft were identified.  The neurovascular structures were identified and an approach was found specifically avoiding these structures.  A steroid injection was performed under ultrasound guidance with sterile technique using 26m of 1% lidocaine without epinephrine and 40 mg of triamcinolone (KENALOG) '40mg'$ /ml. This was well tolerated and resulted in relief.  Needle was removed and dressing placed and post injection instructions were given including  a discussion of likely return of pain today after the anesthetic wears off (with the possibility of worsened pain) until the steroid starts to work in 1-3 days.   Pt was advised to call or return to clinic if these symptoms worsen or fail to improve as anticipated. ? ? ?Pertinent previous records reviewed include none ?  ?Follow Up: 3-4 weeks for reevaluation ?  ?Subjective:   ?I, MPincus Badder am serving as a sEducation administratorfor Doctor [employer]?   ?Chief Complaint: leg pain  ?  ?HPI:  ?07/21/21 ?Patient states he has lump on right lower calf continues and now pain from the lower leg that radiates up affects his ability to walk and exercise he walks 10 to 11 miles a day.  Has some discomfort in the hip area on the left. ?He did see maybe a dermatologist someone who injected hydrocortisone in the area and it did not make a difference in the past. Patient describes pain as shooting pain that goes up the R leg to the lower back. Patient has been having this pain for months. Patient is also having left side Rib pain that has been bothering him for about 2 weeks  ?  ?Swelling: no ?Aggravates: walking  ?Treatments tried: tylenol ?  ?08/18/21 ?Patient states that the pain in the hip is not as pronounced as it was but can still feel the pain when he walks. Patient states he walk 7 miles this morning pain didn't start till he was at the peak. ?  ?01/26/2022 ?Patient states that his right medial knee is still bothering him piercing sensation that shoots up affecting his whole right side up to his right hip , got worse this past week walks a lot  after long walk the pain comes on strongly. No numbness or tingling , has not taken any meds the tylenol broke him out last time he took it ,has a piercing pain behind his right eye  ?  ?02/19/2022 ?Patient states that the knee pain is pretty much gone the pain is not any where like it was. Patient states that the R hip is still painful especially after his daily walks. Patient states that  the actual walking helps with the pain but once he settles down for the night that pain is worse ? ?03/08/2022 ?Patient states that twhen he walks there is no pain when he finishes his walk the pain comes, states  when it cold the pain is intense,has a pain in his toe that goes all the way to his head  ? ? ?Relevant Historical Information: Right-sided hernia, ? ?Additional pertinent review of systems negative. ? ? ?Current Outpatient Medications:   ?  Ascorbic Acid (VITAMIN C) 1000 MG tablet, Take 1,000 mg by mouth daily., Disp: , Rfl:  ?  cholecalciferol (VITAMIN D3) 25 MCG (1000 UNIT) tablet, Take 2,000 Units by mouth daily., Disp: , Rfl:  ?  omeprazole (PRILOSEC) 40 MG capsule, Take 1 capsule (40 mg total) by mouth daily., Disp: 30 capsule, Rfl: 1 ?  famotidine (PEPCID) 20 MG tablet, Take 1 tablet (20 mg total) by mouth 2 (two) times daily., Disp: 60 tablet, Rfl: 0  ? ?Objective:   ?  ?Vitals:  ? 03/08/22 1437  ?BP: 118/82  ?Pulse: 66  ?SpO2: 97%  ?Weight: 212 lb (96.2 kg)  ?Height: '5\' 10"'$  (1.778 m)  ?  ?  ?Body mass index is 30.42 kg/m?.  ?  ?Physical Exam:   ? ?General: awake, alert, and oriented no acute distress, nontoxic ?Skin: no suspicious lesions or rashes ?Neuro:sensation intact distally with no dificits, normal muscle tone, no atrophy, strength 5/5 in all tested lower ext groups ?Psych: normal mood and affect, speech clear ?  ?Right hip: ?No deformity, swelling or wasting ?ROM Flexion 90, ext 30, IR 45, ER 45 ?NTTP over the hip flexors, greater troch, glute musculature, si joint, lumbar spine ?Negative log roll with FROM ?Positive FABER ?Positive FADIR ?Negative Piriformis test ?Negative trendelenberg ?Gait normal  ? ? ?Electronically signed by:  ?Brad Terry D.Merril Abbe ?[facility] ?3:04 PM 03/08/22 ?

## 2022-03-08 ENCOUNTER — Ambulatory Visit: Payer: Self-pay

## 2022-03-08 ENCOUNTER — Ambulatory Visit (INDEPENDENT_AMBULATORY_CARE_PROVIDER_SITE_OTHER): Payer: Medicare Other | Admitting: Sports Medicine

## 2022-03-08 VITALS — BP 118/82 | HR 66 | Ht 70.0 in | Wt 212.0 lb

## 2022-03-08 DIAGNOSIS — M1611 Unilateral primary osteoarthritis, right hip: Secondary | ICD-10-CM | POA: Diagnosis not present

## 2022-03-08 DIAGNOSIS — M25551 Pain in right hip: Secondary | ICD-10-CM

## 2022-03-08 NOTE — Patient Instructions (Addendum)
Good to see you  3-4 week follow up  

## 2022-03-22 ENCOUNTER — Ambulatory Visit (INDEPENDENT_AMBULATORY_CARE_PROVIDER_SITE_OTHER): Payer: Medicare Other

## 2022-03-22 ENCOUNTER — Ambulatory Visit (INDEPENDENT_AMBULATORY_CARE_PROVIDER_SITE_OTHER): Payer: Medicare Other | Admitting: Podiatry

## 2022-03-22 ENCOUNTER — Encounter: Payer: Self-pay | Admitting: Podiatry

## 2022-03-22 ENCOUNTER — Other Ambulatory Visit: Payer: Self-pay | Admitting: Podiatry

## 2022-03-22 DIAGNOSIS — M79609 Pain in unspecified limb: Secondary | ICD-10-CM | POA: Diagnosis not present

## 2022-03-22 DIAGNOSIS — B351 Tinea unguium: Secondary | ICD-10-CM

## 2022-03-22 DIAGNOSIS — M205X1 Other deformities of toe(s) (acquired), right foot: Secondary | ICD-10-CM | POA: Diagnosis not present

## 2022-03-22 DIAGNOSIS — M778 Other enthesopathies, not elsewhere classified: Secondary | ICD-10-CM | POA: Diagnosis not present

## 2022-03-22 DIAGNOSIS — M2042 Other hammer toe(s) (acquired), left foot: Secondary | ICD-10-CM

## 2022-03-22 MED ORDER — DEXAMETHASONE SODIUM PHOSPHATE 120 MG/30ML IJ SOLN
2.0000 mg | Freq: Once | INTRAMUSCULAR | Status: AC
  Administered 2022-03-22: 2 mg via INTRA_ARTICULAR

## 2022-03-22 NOTE — Progress Notes (Signed)
Subjective:  Patient ID: Brad Terry, male    DOB: 01/21/1956,  MRN: [id number] HPI Chief Complaint  Patient presents with   Foot Pain    1st MPJ right - aching x 3 weeks, doesn't remember an injury, massaging at home-no help   Nail Problem    2nd toenail right - thick and dark   New Patient (Initial Visit)    66 y.o. male presents with the above complaint.   ROS: Denies fever chills nausea vomiting muscle aches pains calf pain back pain chest pain shortness of breath.  Past Medical History:  Diagnosis Date   Allergy    CHEST PAIN UNSPECIFIED 01/04/2010   Qualifier: Diagnosis of  By: [name], MD, [name]     Diverticulosis    GERD (gastroesophageal reflux disease)    Headaches due to old head injury    Hyperlipidemia    Internal hemorrhoids    RECTAL BLEEDING 09/14/2010   Qualifier: Diagnosis of  By: Regis Bill MD, [name]    Renal cyst    Rotator cuff (capsule) sprain 05/27/2013   Right     URTICARIA, ALLERGIC 12/30/2007   Qualifier: Diagnosis of  By: [name] MD, [name]    Past Surgical History:  Procedure Laterality Date   COLONOSCOPY  12/27/2009   [place]-hyperplastic polyp   KNEE ARTHROSCOPY     right   under the chin surgery       Current Outpatient Medications:    Ascorbic Acid (VITAMIN C) 1000 MG tablet, Take 1,000 mg by mouth daily., Disp: , Rfl:    cholecalciferol (VITAMIN D3) 25 MCG (1000 UNIT) tablet, Take 2,000 Units by mouth daily., Disp: , Rfl:    famotidine (PEPCID) 20 MG tablet, Take 1 tablet (20 mg total) by mouth 2 (two) times daily., Disp: 60 tablet, Rfl: 0   mefloquine (LARIAM) 250 MG tablet, Take 250 mg by mouth once a week., Disp: , Rfl:    omeprazole (PRILOSEC) 40 MG capsule, Take 1 capsule (40 mg total) by mouth daily., Disp: 30 capsule, Rfl: 1  Allergies  Allergen Reactions   Naproxen     Able to take aleve, tinnitus   Piroxicam     REACTION: tinnitus   Statins     Hx of se of 2 statins    Review of Systems Objective:  There were  no vitals filed for this visit.  General: Well developed, nourished, in no acute distress, alert and oriented x3   Dermatological: Skin is warm, dry and supple bilateral. Nails x 10 are well maintained; remaining integument appears unremarkable at this time. There are no open sores, no preulcerative lesions, no rash or signs of infection present.  Vascular: Dorsalis Pedis artery and Posterior Tibial artery pedal pulses are 2/4 bilateral with immedate capillary fill time. Pedal hair growth present. No varicosities and no lower extremity edema present bilateral.   Neruologic: Grossly intact via light touch bilateral. Vibratory intact via tuning fork bilateral. Protective threshold with Semmes Wienstein monofilament intact to all pedal sites bilateral. Patellar and Achilles deep tendon reflexes 2+ bilateral. No Babinski or clonus noted bilateral.   Musculoskeletal: No gross boney pedal deformities bilateral. No pain, crepitus, or limitation noted with foot and ankle range of motion bilateral. Muscular strength 5/5 in all groups tested bilateral.  Pain on palpation and range of motion of the first metatarsophalangeal joint of the right foot there is no erythema edema cellulitis drainage or odor associated with this.  Gait: Unassisted, Nonantalgic.    Radiographs:  Radiographs taken today right foot demonstrates an osseously mature foot with joint space narrowing and flattening of the base of the first metatarsal subchondral sclerosis eburnation and dorsal spurring at the head of the first metatarsal.  There is mild hallux valgus deformity.  Slightly elongated second metatarsal.  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  Plan: Discussed etiology pathology and surgical therapies debrided the hypertrophic nail.  I also injected the joint first metatarsophalangeal joint with  2 mg of dexamethasone local anesthetic after sterile Betadine skin prep.  Also start him on methylprednisolone.  I discussed appropriate shoe gear with stiff soled.  He understands and is amenable to it I will follow-up with him in 4 to 6 weeks.     Mathis Cashman T. [place], [place]

## 2022-03-26 ENCOUNTER — Telehealth: Payer: Self-pay | Admitting: Podiatry

## 2022-03-26 MED ORDER — METHYLPREDNISOLONE 4 MG PO TBPK: ORAL_TABLET | ORAL | 0 refills | Status: DC

## 2022-03-26 NOTE — Addendum Note (Signed)
Addended by: Clovis Riley E on: 03/26/2022 01:06 PM   Modules accepted: Orders

## 2022-03-26 NOTE — Telephone Encounter (Signed)
Medication called in on 03/26/22

## 2022-03-26 NOTE — Telephone Encounter (Signed)
Patient wife called  - medication was not called into the [facility] [facility]?  Patient was seen Friday in the office and was told they were calling medication in ? Please advise

## 2022-04-04 NOTE — Progress Notes (Deleted)
Benito Mccreedy D.[place] Phone: [phone]   Assessment and Plan:     There are no diagnoses linked to this encounter.  ***   Pertinent previous records reviewed include ***   Follow Up: ***     Subjective:   I, Shanzay Hepworth, am serving as a Education administrator for [employer]   Chief Complaint: leg pain    HPI:  07/21/21 Patient states he has lump on right lower calf continues and now pain from the lower leg that radiates up affects his ability to walk and exercise he walks 10 to 11 miles a day.  Has some discomfort in the hip area on the left. He did see maybe a dermatologist someone who injected hydrocortisone in the area and it did not make a difference in the past. Patient describes pain as shooting pain that goes up the R leg to the lower back. Patient has been having this pain for months. Patient is also having left side Rib pain that has been bothering him for about 2 weeks    Swelling: no Aggravates: walking  Treatments tried: tylenol   08/18/21 Patient states that the pain in the hip is not as pronounced as it was but can still feel the pain when he walks. Patient states he walk 7 miles this morning pain didn't start till he was at the peak.   01/26/2022 Patient states that his right medial knee is still bothering him piercing sensation that shoots up affecting his whole right side up to his right hip , got worse this past week walks a lot  after long walk the pain comes on strongly. No numbness or tingling , has not taken any meds the tylenol broke him out last time he took it ,has a piercing pain behind his right eye    02/19/2022 Patient states that the knee pain is pretty much gone the pain is not any where like it was. Patient states that the R hip is still painful especially after his daily walks. Patient states that the actual walking helps with the pain but once he settles down for  the night that pain is worse   03/08/2022 Patient states that twhen he walks there is no pain when he finishes his walk the pain comes, states  when it cold the pain is intense,has a pain in his toe that goes all the way to his head   04/05/2022 Patient states    Relevant Historical Information: Right-sided hernia,  Additional pertinent review of systems negative.   Current Outpatient Medications:    Ascorbic Acid (VITAMIN C) 1000 MG tablet, Take 1,000 mg by mouth daily., Disp: , Rfl:    cholecalciferol (VITAMIN D3) 25 MCG (1000 UNIT) tablet, Take 2,000 Units by mouth daily., Disp: , Rfl:    famotidine (PEPCID) 20 MG tablet, Take 1 tablet (20 mg total) by mouth 2 (two) times daily., Disp: 60 tablet, Rfl: 0   mefloquine (LARIAM) 250 MG tablet, Take 250 mg by mouth once a week., Disp: , Rfl:    methylPREDNISolone (MEDROL DOSEPAK) 4 MG TBPK tablet, 6 day dose pack - take as directed, Disp: 21 tablet, Rfl: 0   omeprazole (PRILOSEC) 40 MG capsule, Take 1 capsule (40 mg total) by mouth daily., Disp: 30 capsule, Rfl: 1   Objective:     There were no vitals filed for this visit.    There is no height or weight on  file to calculate BMI.    Physical Exam:    ***   Electronically signed by:  Benito Mccreedy D.[facility] 11:39 AM 04/04/22

## 2022-04-05 ENCOUNTER — Ambulatory Visit: Payer: Medicare Other | Admitting: Sports Medicine

## 2022-04-09 NOTE — Progress Notes (Unsigned)
Benito Mccreedy D.[place] Phone: [phone]   Assessment and Plan:     There are no diagnoses linked to this encounter.  ***   Pertinent previous records reviewed include ***   Follow Up: ***     Subjective:   I, Brad Terry, am serving as a Education administrator for [employer]   Chief Complaint: leg pain    HPI:  07/21/21 Patient states he has lump on right lower calf continues and now pain from the lower leg that radiates up affects his ability to walk and exercise he walks 10 to 11 miles a day.  Has some discomfort in the hip area on the left. He did see maybe a dermatologist someone who injected hydrocortisone in the area and it did not make a difference in the past. Patient describes pain as shooting pain that goes up the R leg to the lower back. Patient has been having this pain for months. Patient is also having left side Rib pain that has been bothering him for about 2 weeks    Swelling: no Aggravates: walking  Treatments tried: tylenol   08/18/21 Patient states that the pain in the hip is not as pronounced as it was but can still feel the pain when he walks. Patient states he walk 7 miles this morning pain didn't start till he was at the peak.   01/26/2022 Patient states that his right medial knee is still bothering him piercing sensation that shoots up affecting his whole right side up to his right hip , got worse this past week walks a lot  after long walk the pain comes on strongly. No numbness or tingling , has not taken any meds the tylenol broke him out last time he took it ,has a piercing pain behind his right eye    02/19/2022 Patient states that the knee pain is pretty much gone the pain is not any where like it was. Patient states that the R hip is still painful especially after his daily walks. Patient states that the actual walking helps with the pain but once he settles down for  the night that pain is worse   03/08/2022 Patient states that twhen he walks there is no pain when he finishes his walk the pain comes, states  when it cold the pain is intense,has a pain in his toe that goes all the way to his head    04/10/2022 Patient states   Relevant Historical Information: Right-sided hernia,  Additional pertinent review of systems negative.   Current Outpatient Medications:    Ascorbic Acid (VITAMIN C) 1000 MG tablet, Take 1,000 mg by mouth daily., Disp: , Rfl:    cholecalciferol (VITAMIN D3) 25 MCG (1000 UNIT) tablet, Take 2,000 Units by mouth daily., Disp: , Rfl:    famotidine (PEPCID) 20 MG tablet, Take 1 tablet (20 mg total) by mouth 2 (two) times daily., Disp: 60 tablet, Rfl: 0   mefloquine (LARIAM) 250 MG tablet, Take 250 mg by mouth once a week., Disp: , Rfl:    methylPREDNISolone (MEDROL DOSEPAK) 4 MG TBPK tablet, 6 day dose pack - take as directed, Disp: 21 tablet, Rfl: 0   omeprazole (PRILOSEC) 40 MG capsule, Take 1 capsule (40 mg total) by mouth daily., Disp: 30 capsule, Rfl: 1   Objective:     There were no vitals filed for this visit.    There is no height or weight on  file to calculate BMI.    Physical Exam:    ***   Electronically signed by:  Benito Mccreedy D.[facility] 1:08 PM 04/09/22

## 2022-04-10 ENCOUNTER — Ambulatory Visit (INDEPENDENT_AMBULATORY_CARE_PROVIDER_SITE_OTHER): Payer: Medicare Other | Admitting: Sports Medicine

## 2022-04-10 VITALS — BP 118/74 | HR 73 | Ht 70.0 in | Wt 204.0 lb

## 2022-04-10 DIAGNOSIS — M25551 Pain in right hip: Secondary | ICD-10-CM | POA: Diagnosis not present

## 2022-04-10 DIAGNOSIS — M1611 Unilateral primary osteoarthritis, right hip: Secondary | ICD-10-CM | POA: Diagnosis not present

## 2022-05-03 ENCOUNTER — Ambulatory Visit (INDEPENDENT_AMBULATORY_CARE_PROVIDER_SITE_OTHER): Payer: Medicare Other | Admitting: Podiatry

## 2022-05-03 ENCOUNTER — Encounter: Payer: Self-pay | Admitting: Podiatry

## 2022-05-03 DIAGNOSIS — B351 Tinea unguium: Secondary | ICD-10-CM | POA: Diagnosis not present

## 2022-05-03 DIAGNOSIS — M778 Other enthesopathies, not elsewhere classified: Secondary | ICD-10-CM

## 2022-05-03 DIAGNOSIS — M19071 Primary osteoarthritis, right ankle and foot: Secondary | ICD-10-CM

## 2022-05-03 DIAGNOSIS — M79609 Pain in unspecified limb: Secondary | ICD-10-CM

## 2022-05-03 NOTE — Progress Notes (Signed)
He presents today for follow-up of his painful capsulitis first metatarsophalangeal joint of the right foot.  He states that he still hurts and really has not improved at all with immobilization anti-inflammatories steroids and on steroids and has not improved with the injection.  He states he has tried different shoes with no improvement.  He states that is limiting his ability to perform his daily activities which she enjoys walking long distances.  Objective: Vital signs are stable he is alert oriented x3 he has pain on range of motion and end range of motion particular of the first metatarsophalangeal joint of the right foot.  The joint is thick does appear to have spurs around it.  And is painful on palpation.  I reviewed previous radiographs consistent with o osteoarthritis.  Assessment: Most likely severe degenerative joint disease of the first metatarsal phalangeal joint most likely will necessitate some surgical intervention.  Plan: At this point we are requesting MRI of the first metatarsophalangeal joint of the right foot to be evaluated for surgical intervention.  Most likely this is severe osteoarthritic change with capsulitis possible tear of the plantar plate.

## 2022-05-22 ENCOUNTER — Ambulatory Visit
Admission: RE | Admit: 2022-05-22 | Discharge: 2022-05-22 | Disposition: A | Discharge status: Home / Self Care | Payer: Medicare Other | Source: Ambulatory Visit | Attending: Podiatry | Admitting: Podiatry

## 2022-05-22 DIAGNOSIS — M19071 Primary osteoarthritis, right ankle and foot: Secondary | ICD-10-CM

## 2022-06-14 ENCOUNTER — Telehealth: Payer: Self-pay

## 2022-06-18 NOTE — Telephone Encounter (Signed)
Patient is aware of MRI results but wanted to discuss further information. Patient has been scheduled for 07/23/22 with Dr. Milinda Pointer.

## 2022-07-19 LAB — PSA: PSA: 0.8

## 2022-07-23 ENCOUNTER — Ambulatory Visit: Payer: Medicare Other | Admitting: Podiatry

## 2022-08-09 ENCOUNTER — Ambulatory Visit (INDEPENDENT_AMBULATORY_CARE_PROVIDER_SITE_OTHER): Payer: Medicare Other | Admitting: Podiatry

## 2022-08-09 DIAGNOSIS — M778 Other enthesopathies, not elsewhere classified: Secondary | ICD-10-CM

## 2022-08-09 MED ORDER — CELECOXIB 100 MG PO CAPS: 100.0000 mg | ORAL_CAPSULE | Freq: Two times a day (BID) | ORAL | 3 refills | Status: DC

## 2022-08-09 NOTE — Progress Notes (Signed)
He presents today states that his great toe first metatarsophalangeal joint might be a little bit better than what it was previously.  Is interested to know what the MRI had to say.  He sees Raliegh Ip for his back pain.  Objective: Vital signs are stable alert oriented x3.  Pulses are palpable.  Still has pain on end range of motion particularly dorsiflexion of the first metatarsophalangeal joint of the right foot.  We went over the MRI findings today which were consistent with osteoarthritic change which we had initially discussed.  Assessment osteoarthritis first metatarsophalangeal joint right foot.  Plan: Discussed etiology pathology conservative surgical therapies.  Started him on Celebrex even though he states that he has allergies to certain NSAIDs which was only headache.  He states he had not tried Celebrex before and since that has its own family he would like to consider that.  We did discuss the possible need for fusion as well as Jake Michaelis arthroplasty with implant.  At this point he would like to consider injections of Kenalog which we performed today.  He tolerated the procedure well after Betadine skin prep.  Follow-up with him on an as-needed basis.

## 2022-09-05 ENCOUNTER — Encounter: Payer: Self-pay | Admitting: Internal Medicine

## 2022-09-05 HISTORY — PX: INGUINAL HERNIA REPAIR: SUR1180

## 2022-09-05 NOTE — Progress Notes (Signed)
[facility]

## 2022-12-13 ENCOUNTER — Telehealth: Payer: Self-pay | Admitting: Internal Medicine

## 2022-12-13 NOTE — Telephone Encounter (Signed)
Left message for patient to call back and schedule Medicare Annual Wellness Visit (AWV) either virtually or in office. Left  my jabber number [phone]   awvi 12/06/22 per [facility] please schedule with Nurse Health Adviser   45 min for awv-i  in office appointments 30 min for awv-s & awv-i phone/virtual appointments  

## 2023-01-01 ENCOUNTER — Telehealth (INDEPENDENT_AMBULATORY_CARE_PROVIDER_SITE_OTHER): Payer: Medicare Other | Admitting: Family Medicine

## 2023-01-01 VITALS — Ht 70.0 in | Wt 195.0 lb

## 2023-01-01 DIAGNOSIS — Z0000 Encounter for general adult medical examination without abnormal findings: Secondary | ICD-10-CM

## 2023-01-01 NOTE — Patient Instructions (Addendum)
I really enjoyed getting to talk with you today! I am available on Tuesdays and Thursdays for virtual visits if you have any questions or concerns, or if I can be of any further assistance.   CHECKLIST FROM ANNUAL WELLNESS VISIT:  -Follow up (please call to schedule if not scheduled after visit):  -Inperson visit with your Primary Doctor office: Please call the office to schedule your inperson physical ASAP for your labs and check up -yearly for annual wellness visit with primary care office  Here is a list of your preventive care/health maintenance measures and the plan for each if any are due:  Health Maintenance  Topic Date Due   Diabetic kidney evaluation - Urine ACR  Never done - schedule in person visit   Zoster Vaccines- Shingrix (1 of 2) Never done,  can do at pharmacy if you wish   DTaP/Tdap/Td (2 - Tdap) 01/10/2016, can do at the office or pharmacy   Pneumonia Vaccine 24+ Years old (1 of 1 - PCV) Never done, can do at office or pharmacy   HEMOGLOBIN A1C  01/10/2022, schedule inperson visit   Diabetic kidney evaluation - eGFR measurement  07/13/2022, schedule in person visit   COVID-19 Vaccine (1) 01/17/2023 (Originally 07/11/1956)   INFLUENZA VACCINE  02/03/2023 (Originally 06/05/2022)   COLONOSCOPY (Pts 45-53yr Insurance coverage will need to be confirmed)  02/09/2023 - please call your gastroenterologist    Medicare Annual Wellness (AWV)  01/02/2024   Hepatitis C Screening  Completed   HPV VACCINES  Aged Out    -See a dentist at least yearly  -Get your eyes checked and then per your eye specialist's recommendations  -Other issues addressed today:   -I have included below further information regarding a healthy whole foods based diet, physical activity guidelines for adults, stress management and opportunities for social connections. I hope you find this information useful.    -----------------------------------------------------------------------------------------------------------------------------------------------------------------------------------------------------------------------------------------------------------  NUTRITION: -eat real food: lots of colorful vegetables (half the plate) and fruits -5-7 servings of vegetables and fruits per day (fresh or steamed is best), exp. 2 servings of vegetables with lunch and dinner and 2 servings of fruit per day. Berries and greens such as kale and collards are great choices.  -consume on a regular basis: whole grains (make sure first ingredient on label contains the word "whole"), fresh fruits, fish, nuts, seeds, healthy oils (such as olive oil, avocado oil, grape seed oil) -may eat small amounts of dairy and lean meat on occasion, but avoid processed meats such as ham, bacon, lunch meat, etc. -drink water -try to avoid fast food and pre-packaged foods, processed meat -most experts advise limiting sodium to < '2300mg'$  per day, should limit further is any chronic conditions such as high blood pressure, heart disease, diabetes, etc. The American Heart Association advised that < '1500mg'$  is is ideal -try to avoid foods that contain any ingredients with names you do not recognize  -try to avoid sugar/sweets (except for the natural sugar that occurs in fresh fruit) -try to avoid sweet drinks -try to avoid white rice, white bread, pasta (unless whole grain), white or yellow potatoes  EXERCISE GUIDELINES FOR ADULTS: -if you wish to increase your physical activity, do so gradually and with the approval of your doctor -STOP and seek medical care immediately if you have any chest pain, chest discomfort or trouble breathing when starting or increasing exercise  -move and stretch your body, legs, feet and arms when sitting for long periods -Physical activity guidelines for optimal  health in adults: -least 150 minutes per week of  aerobic exercise (can talk, but not sing) once approved by your doctor, 20-30 minutes of sustained activity or two 10 minute episodes of sustained activity every day.  -resistance training at least 2 days per week if approved by your doctor -balance exercises 3+ days per week:   Stand somewhere where you have something sturdy to hold onto if you lose balance.    1) lift up on toes, start with 5x per day and work up to 20x   2) stand and lift on leg straight out to the side so that foot is a few inches of the floor, start with 5x each side and work up to 20x each side   3) stand on one foot, start with 5 seconds each side and work up to 20 seconds on each side  If you need ideas or help with getting more active:  -Silver sneakers https://tools.silversneakers.com  -Walk with a Doc: http://stephens-thompson.biz/  -try to include resistance (weight lifting/strength building) and balance exercises twice per week: or the following link for ideas: ChessContest.fr  UpdateClothing.com.cy  STRESS MANAGEMENT: -can try meditating, or just sitting quietly with deep breathing while intentionally relaxing all parts of your body for 5 minutes daily -if you need further help with stress, anxiety or depression please follow up with your primary doctor or contact the wonderful folks at [place]: [place]: -options in [place] if you wish to engage in more social and exercise related activities:  -Silver sneakers https://tools.silversneakers.com  -Walk with a Doc: http://stephens-thompson.biz/  -Check out the [place] 50+ section on the [place] [employer] (hiking clubs, book clubs, cards and games, chess, exercise classes, aquatic classes and much more) - see the website for  details: https://www.[place].gov/departments/parks-recreation/active-adults50  -YouTube has lots of exercise videos for different ages and abilities as well  -[place] (a variety of indoor and outdoor inperson activities for adults). (281)522-8566. [address].  -Virtual Online Classes (a variety of topics): see seniorplanet.org or call [phone]  -consider volunteering at a school, hospice center, church, senior center or elsewhere         ADVANCED HEALTHCARE DIRECTIVES:  Everyone should have advanced health care directives in place. This is so that you get the care you want, should you ever be in a situation where you are unable to make your own medical decisions.   From the [place] Advanced Directive Website: "[place] are legal documents in which you give written instructions about your health care if, in the future, you cannot speak for yourself.   A health care power of attorney allows you to name a person you trust to make your health care decisions if you cannot make them yourself. A declaration of a desire for a natural death (or living will) is document, which states that you desire not to have your life prolonged by extraordinary measures if you have a terminal or incurable illness or if you are in a vegetative state. An advance instruction for mental health treatment makes a declaration of instructions, information and preferences regarding your mental health treatment. It also states that you are aware that the advance instruction authorizes a mental health treatment provider to act according to your wishes. It may also outline your consent or refusal of mental health treatment. A declaration of an anatomical gift allows anyone over the age of 40 to make a gift by will, organ donor card  or other document."   Please see the following website or an elder law attorney for forms, FAQs and for completion of advanced  directives: [place] [employer] of [place] ([email or web address].no)  Or copy and paste the following to your web browser: PokerReunion.com.cy

## 2023-01-01 NOTE — Progress Notes (Signed)
PATIENT CHECK-IN and HEALTH RISK ASSESSMENT QUESTIONNAIRE:  -completed by phone/video for upcoming Medicare Preventive Visit  Pre-Visit Check-in: 1)Vitals (height, wt, BP, etc) - record in vitals section for visit on day of visit 2)Review and Update Medications, Allergies PMH, Surgeries, Social history in Epic 3)Hospitalizations in the last year with date/reason? no  4)Review and Update Care Team (patient's specialists) in Epic 5) Complete PHQ9 in Epic  6) Complete Fall Screening in Epic 7)Review all Health Maintenance Due and order under PCP if not done.  Medicare Wellness Patient Questionnaire:  Answer theses question about your habits: Do you drink alcohol? no If yes, how many drinks do you have a day?n/a Have you ever smoked?no Quit date if applicable? N/a  How many packs a day do/did you smoke? N/a Do you use smokeless tobacco?no Do you use an illicit drugs?no Do you exercises? Yes IF so, what type and how many days/minutes per week? Walking every morning for 10 miles - 4 hours.  Are you sexually active? Not answering Number of partners?n/a Typical breakfast: water Typical lunch: tuna salad with salted crackers Typical dinner: home cook-rice, fish Typical snacks: plantain chip  Beverages: water, mix with ginger juice.   Answer theses question about you: Can you perform most household chores?yes Do you find it hard to follow a conversation in a noisy room?never been in that environment. Do you often ask people to speak up or repeat themselves? sometimes Do you feel that you have a problem with memory?yes Do you balance your checkbook and or bank acounts?yes Do you feel safe at home?yes Last dentist visit?09/2022 Do you need assistance with any of the following: Please note if so No  Driving?  Feeding yourself?  Getting from bed to chair?  Getting to the toilet?  Bathing or showering?  Dressing yourself?  Managing money?  Climbing a flight of stairs  Preparing  meals?    Do you have Advanced Directives in place (Living Will, Healthcare Power or Attorney)? no   Last eye Exam and location?Last year with [facility]   Do you currently use prescribed or non-prescribed narcotic or opioid pain medications?no  Do you have a history or close family history of breast, ovarian, tubal or peritoneal cancer or a family member with BRCA (breast cancer susceptibility 1 and 2) gene mutations? Brother died from prostate cancer, also parkinson's disease.   Nurse/Assistant Credentials/time stamp: Karpuih Moyun/CMA/4:16pm   ----------------------------------------------------------------------------------------------------------------------------------------------------------------------------------------------------------------------    MEDICARE ANNUAL PREVENTIVE CARE VISIT WITH PROVIDER (Welcome to Medicare, initial annual wellness or annual wellness exam)  Virtual Visit via Phone Note  I connected with Behnam Shawhan [name] on 01/01/23  by phone and verified that I am speaking with the correct person using two identifiers.  Location patient: home Location provider:work or home office Persons participating in the virtual visit: patient, provider  Concerns and/or follow up today: none, reports is doing well.   See HM section in Epic for other details of completed HM.    ROS: negative for report of fevers, unintentional weight loss, vision changes, vision loss, hearing loss or change, chest pain, sob, hemoptysis, melena, hematochezia, hematuria, genital discharge or lesions, falls, bleeding or bruising, loc, thoughts of suicide or self harm, memory loss  Patient-completed extensive health risk assessment - reviewed and discussed with the patient: See Health Risk Assessment completed with patient prior to the visit either above or in recent phone note. This was reviewed in detailed with the patient today and appropriate recommendations, orders and  referrals were placed  as needed per Summary below and patient instructions.   Review of Medical History: -PMH, PSH, Family History and current specialty and care providers reviewed and updated and listed below   Patient Care Team: Panosh, [name], MD as PCP - General Olevia Perches, Lowella Bandy, MD (Inactive) (Gastroenterology) Curt Bears, MD (Hematology and Oncology)   Past Medical History:  Diagnosis Date   Allergy    CHEST PAIN UNSPECIFIED 01/04/2010   Qualifier: Diagnosis of  By: [name], MD, [name]     Diverticulosis    GERD (gastroesophageal reflux disease)    Headaches due to old head injury    Hyperlipidemia    Internal hemorrhoids    RECTAL BLEEDING 09/14/2010   Qualifier: Diagnosis of  By: Regis Bill MD, [name]    Renal cyst    Rotator cuff (capsule) sprain 05/27/2013   Right     URTICARIA, ALLERGIC 12/30/2007   Qualifier: Diagnosis of  By: [name] MD, [name]     Past Surgical History:  Procedure Laterality Date   COLONOSCOPY  12/27/2009   Brodie-hyperplastic polyp   KNEE ARTHROSCOPY     right   under the chin surgery       Social History   Socioeconomic History   Marital status: Married    Spouse name: [name]   Number of children: Not on file   Years of education: Not on file   Highest education level: Not on file  Occupational History   Not on file  Tobacco Use   Smoking status: Never   Smokeless tobacco: Never  Vaping Use   Vaping Use: Never used  Substance and Sexual Activity   Alcohol use: No    Alcohol/week: 0.0 standard drinks of alcohol   Drug use: No   Sexual activity: Not on file  Other Topics Concern   Not on file  Social History Narrative   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   Tries to walk with exercise      [place] country of origin      Caffeine- seldom    Social Determinants of Radio broadcast assistant Strain: Not on file  Food Insecurity: Not on file  Transportation Needs: Not on file  Physical  Activity: Not on file  Stress: Not on file  Social Connections: Not on file  Intimate Partner Violence: Not on file    Family History  Problem Relation Age of Onset   Diabetes Mother    Diabetes Brother        x 2   Prostate cancer Brother        x 2   Colon cancer Neg Hx    Colon polyps Neg Hx    Esophageal cancer Neg Hx    Rectal cancer Neg Hx    Stomach cancer Neg Hx     Current Outpatient Medications on File Prior to Visit  Medication Sig Dispense Refill   Ascorbic Acid (VITAMIN C) 1000 MG tablet Take 1,000 mg by mouth daily.     celecoxib (CELEBREX) 100 MG capsule Take 1 capsule (100 mg total) by mouth 2 (two) times daily. (Patient not taking: Reported on 01/01/2023) 60 capsule 3   cholecalciferol (VITAMIN D3) 25 MCG (1000 UNIT) tablet Take 2,000 Units by mouth daily. (Patient not taking: Reported on 01/01/2023)     famotidine (PEPCID) 20 MG tablet Take 1 tablet (20 mg total) by mouth 2 (two) times daily. 60 tablet 0   mefloquine (LARIAM) 250 MG  tablet Take 250 mg by mouth once a week. (Patient not taking: Reported on 01/01/2023)     omeprazole (PRILOSEC) 40 MG capsule Take 1 capsule (40 mg total) by mouth daily. (Patient not taking: Reported on 01/01/2023) 30 capsule 1   No current facility-administered medications on file prior to visit.    Allergies  Allergen Reactions   Advil [Ibuprofen] Hives   Naproxen     Able to take aleve, tinnitus   Piroxicam     REACTION: tinnitus   Statins     Hx of se of 2 statins    Tylenol [Acetaminophen] Hives       Physical Exam There were no vitals filed for this visit. Estimated body mass index is 27.98 kg/m as calculated from the following:   Height as of this encounter: '5\' 10"'$  (1.778 m).   Weight as of this encounter: 195 lb (88.5 kg).  EKG (optional): deferred due to virtual visit  GENERAL: alert, oriented, no acute distress detected; full vision exam deferred due to virtual encounter  PSYCH/NEURO: pleasant and  cooperative, no obvious depression or anxiety, speech and thought processing grossly intact, Cognitive function grossly intact  [place] Office Visit from 07/12/2021 in [facility]  PHQ-9 Total Score 0           01/01/2023    4:03 PM 07/12/2021    3:09 PM  Depression screen PHQ 2/9  Decreased Interest 0 0  Down, Depressed, Hopeless 0 0  PHQ - 2 Score 0 0  Altered sleeping  0  Tired, decreased energy  0  Change in appetite  0  Feeling bad or failure about yourself   0  Trouble concentrating  0  Moving slowly or fidgety/restless  0  Suicidal thoughts  0  PHQ-9 Score  0       01/01/2023    4:02 PM  Fall Risk  Falls in the past year? 0  Was there an injury with Fall? 0  Fall Risk Category Calculator 0  Patient at Risk for Falls Due to No Fall Risks  Fall risk Follow up Falls evaluation completed     SUMMARY AND PLAN:  Encounter for Medicare annual wellness exam   Discussed applicable health maintenance/preventive health measures and advised and referred or ordered per patient preferences:  Health Maintenance  Topic Date Due   Medicare Annual Wellness (AWV)  Never done   COVID-19 Vaccine (1) He had several doses a few years ago, does not wish to get further   Diabetic kidney evaluation - Urine ACR  Never done   Zoster Vaccines- Shingrix (1 of 2) Never done   DTaP/Tdap/Td (2 - Tdap) 01/10/2016, he thinks he may have had this a few years ago - he agrees to check and let us know   Pneumonia Vaccine 68+ Years old (1 of 1 - PCV) Never done   HEMOGLOBIN A1C  01/10/2022   INFLUENZA VACCINE  06/05/2022, no longer wishes to have this   Diabetic kidney evaluation - eGFR measurement  07/13/2022   COLONOSCOPY (Pts 45-17yr Insurance coverage will need to be confirmed)  02/09/2023, reviewed last GI letter - advised he call GI to schedule.   Hepatitis C Screening  Completed   HPV VACCINES  Aged Out  Advised in person CPE with PCP with labs asap.  Sent message to schedulers to assist.  Prostate cancer screening with PSA was done in 2023  Education and counseling on the following was provided based  on the above review of health and a plan/checklist for the patient, along with additional information discussed, was provided for the patient in the patient instructions :  -Advised on importance of and resources for completing advanced directives - provided further info in patient instructions -Advised and counseled on maintaining healthy weight and healthy lifestyle - including the importance of a healthy diet, regular physical activity, social connections and stress management. -Advised and counseled on a whole foods based healthy diet and regular exercise: discussed a heart healthy whole foods based diet at length. A summary of a healthy diet was provided in the Patient Instructions.  -congratulated on his remarkable exercise program! Encouraged to continue regular exercise and exercise guidelines provided. He reports had great success in reversing diabetes and bringing weight down with his walking and he also is inspiring others. This is wonderful. -Advised yearly dental visits at minimum and regular eye exams -Advised and counseled on alcohol, tobacco, drug, opoid use/misuse if applicable and options for help if applicable.  Follow up: see patient instructions   Patient Instructions  I really enjoyed getting to talk with you today! I am available on Tuesdays and Thursdays for virtual visits if you have any questions or concerns, or if I can be of any further assistance.   CHECKLIST FROM ANNUAL WELLNESS VISIT:  -Follow up (please call to schedule if not scheduled after visit):  -Inperson visit with your Primary Doctor office: Please call the office to schedule your inperson physical ASAP for your labs and check up -yearly for annual wellness visit with primary care office  Here is a list of your preventive care/health maintenance measures  and the plan for each if any are due:  Health Maintenance  Topic Date Due   Diabetic kidney evaluation - Urine ACR  Never done - schedule in person visit   Zoster Vaccines- Shingrix (1 of 2) Never done,  can do at pharmacy if you wish   DTaP/Tdap/Td (2 - Tdap) 01/10/2016, can do at the office or pharmacy   Pneumonia Vaccine 9+ Years old (1 of 1 - PCV) Never done, can do at office or pharmacy   HEMOGLOBIN A1C  01/10/2022, schedule inperson visit   Diabetic kidney evaluation - eGFR measurement  07/13/2022, schedule in person visit   COVID-19 Vaccine (1) 01/17/2023 (Originally 07/11/1956)   INFLUENZA VACCINE  02/03/2023 (Originally 06/05/2022)   COLONOSCOPY (Pts 45-53yr Insurance coverage will need to be confirmed)  02/09/2023 - please call your gastroenterologist    Medicare Annual Wellness (AWV)  01/02/2024   Hepatitis C Screening  Completed   HPV VACCINES  Aged Out    -See a dentist at least yearly  -Get your eyes checked and then per your eye specialist's recommendations  -Other issues addressed today:   -I have included below further information regarding a healthy whole foods based diet, physical activity guidelines for adults, stress management and opportunities for social connections. I hope you find this information useful.   -----------------------------------------------------------------------------------------------------------------------------------------------------------------------------------------------------------------------------------------------------------  NUTRITION: -eat real food: lots of colorful vegetables (half the plate) and fruits -5-7 servings of vegetables and fruits per day (fresh or steamed is best), exp. 2 servings of vegetables with lunch and dinner and 2 servings of fruit per day. Berries and greens such as kale and collards are great choices.  -consume on a regular basis: whole grains (make sure first ingredient on label contains the word "whole"),  fresh fruits, fish, nuts, seeds, healthy oils (such as olive oil, avocado oil, grape seed oil) -  may eat small amounts of dairy and lean meat on occasion, but avoid processed meats such as ham, bacon, lunch meat, etc. -drink water -try to avoid fast food and pre-packaged foods, processed meat -most experts advise limiting sodium to < '2300mg'$  per day, should limit further is any chronic conditions such as high blood pressure, heart disease, diabetes, etc. The American Heart Association advised that < '1500mg'$  is is ideal -try to avoid foods that contain any ingredients with names you do not recognize  -try to avoid sugar/sweets (except for the natural sugar that occurs in fresh fruit) -try to avoid sweet drinks -try to avoid white rice, white bread, pasta (unless whole grain), white or yellow potatoes  EXERCISE GUIDELINES FOR ADULTS: -if you wish to increase your physical activity, do so gradually and with the approval of your doctor -STOP and seek medical care immediately if you have any chest pain, chest discomfort or trouble breathing when starting or increasing exercise  -move and stretch your body, legs, feet and arms when sitting for long periods -Physical activity guidelines for optimal health in adults: -least 150 minutes per week of aerobic exercise (can talk, but not sing) once approved by your doctor, 20-30 minutes of sustained activity or two 10 minute episodes of sustained activity every day.  -resistance training at least 2 days per week if approved by your doctor -balance exercises 3+ days per week:   Stand somewhere where you have something sturdy to hold onto if you lose balance.    1) lift up on toes, start with 5x per day and work up to 20x   2) stand and lift on leg straight out to the side so that foot is a few inches of the floor, start with 5x each side and work up to 20x each side   3) stand on one foot, start with 5 seconds each side and work up to 20 seconds on each  side  If you need ideas or help with getting more active:  -Silver sneakers https://tools.silversneakers.com  -Walk with a Doc: http://stephens-thompson.biz/  -try to include resistance (weight lifting/strength building) and balance exercises twice per week: or the following link for ideas: ChessContest.fr  UpdateClothing.com.cy  STRESS MANAGEMENT: -can try meditating, or just sitting quietly with deep breathing while intentionally relaxing all parts of your body for 5 minutes daily -if you need further help with stress, anxiety or depression please follow up with your primary doctor or contact the wonderful folks at [place]: [place]: -options in [place] if you wish to engage in more social and exercise related activities:  -Silver sneakers https://tools.silversneakers.com  -Walk with a Doc: http://stephens-thompson.biz/  -Check out the [place] 50+ section on the [place] of [employer] (hiking clubs, book clubs, cards and games, chess, exercise classes, aquatic classes and much more) - see the website for details: https://www.[place].gov/departments/parks-recreation/active-adults50  -YouTube has lots of exercise videos for different ages and abilities as well  -[place] (a variety of indoor and outdoor inperson activities for adults). ([phone]. [address].  -Virtual Online Classes (a variety of topics): see seniorplanet.org or call [phone]  -consider volunteering at a school, hospice center, church, senior center or elsewhere         ADVANCED HEALTHCARE DIRECTIVES:  Everyone should have advanced health care directives in place. This is so that you get the care you want, should you ever be in a situation where you are unable to make your own medical  decisions.   From the [place]  Advanced Directive Website: "Leslie are legal documents in which you give written instructions about your health care if, in the future, you cannot speak for yourself.   A health care power of attorney allows you to name a person you trust to make your health care decisions if you cannot make them yourself. A declaration of a desire for a natural death (or living will) is document, which states that you desire not to have your life prolonged by extraordinary measures if you have a terminal or incurable illness or if you are in a vegetative state. An advance instruction for mental health treatment makes a declaration of instructions, information and preferences regarding your mental health treatment. It also states that you are aware that the advance instruction authorizes a mental health treatment provider to act according to your wishes. It may also outline your consent or refusal of mental health treatment. A declaration of an anatomical gift allows anyone over the age of 52 to make a gift by will, organ donor card or other document."   Please see the following website or an elder law attorney for forms, FAQs and for completion of advanced directives: [place] Secretary of [place] ([email or web address].no)  Or copy and paste the following to your web browser: PokerReunion.com.cy    Lucretia Kern, DO

## 2023-02-14 ENCOUNTER — Ambulatory Visit (INDEPENDENT_AMBULATORY_CARE_PROVIDER_SITE_OTHER): Payer: Medicare Other | Admitting: Internal Medicine

## 2023-02-14 ENCOUNTER — Encounter: Payer: Self-pay | Admitting: Internal Medicine

## 2023-02-14 VITALS — BP 120/70 | HR 58 | Temp 98.0°F | Ht 68.0 in | Wt 197.2 lb

## 2023-02-14 DIAGNOSIS — M1611 Unilateral primary osteoarthritis, right hip: Secondary | ICD-10-CM

## 2023-02-14 DIAGNOSIS — K219 Gastro-esophageal reflux disease without esophagitis: Secondary | ICD-10-CM | POA: Diagnosis not present

## 2023-02-14 DIAGNOSIS — Z125 Encounter for screening for malignant neoplasm of prostate: Secondary | ICD-10-CM | POA: Diagnosis not present

## 2023-02-14 DIAGNOSIS — D696 Thrombocytopenia, unspecified: Secondary | ICD-10-CM

## 2023-02-14 DIAGNOSIS — E785 Hyperlipidemia, unspecified: Secondary | ICD-10-CM

## 2023-02-14 DIAGNOSIS — Z0000 Encounter for general adult medical examination without abnormal findings: Secondary | ICD-10-CM

## 2023-02-14 DIAGNOSIS — D72810 Lymphocytopenia: Secondary | ICD-10-CM

## 2023-02-14 DIAGNOSIS — Z79899 Other long term (current) drug therapy: Secondary | ICD-10-CM

## 2023-02-14 DIAGNOSIS — N401 Enlarged prostate with lower urinary tract symptoms: Secondary | ICD-10-CM

## 2023-02-14 DIAGNOSIS — R413 Other amnesia: Secondary | ICD-10-CM

## 2023-02-14 DIAGNOSIS — R350 Frequency of micturition: Secondary | ICD-10-CM

## 2023-02-14 LAB — PSA: PSA: 1.36 ng/mL (ref 0.10–4.00)

## 2023-02-14 LAB — CBC WITH DIFFERENTIAL/PLATELET
Basophils Absolute: 0 10*3/uL (ref 0.0–0.1)
Basophils Relative: 0.6 % (ref 0.0–3.0)
Eosinophils Absolute: 0.1 10*3/uL (ref 0.0–0.7)
Eosinophils Relative: 4.6 % (ref 0.0–5.0)
HCT: 38.2 % — ABNORMAL LOW (ref 39.0–52.0)
Hemoglobin: 12.9 g/dL — ABNORMAL LOW (ref 13.0–17.0)
Lymphocytes Relative: 34 % (ref 12.0–46.0)
Lymphs Abs: 1 10*3/uL (ref 0.7–4.0)
MCHC: 33.8 g/dL (ref 30.0–36.0)
MCV: 90.1 fl (ref 78.0–100.0)
Monocytes Absolute: 0.2 10*3/uL (ref 0.1–1.0)
Monocytes Relative: 6.7 % (ref 3.0–12.0)
Neutro Abs: 1.5 10*3/uL (ref 1.4–7.7)
Neutrophils Relative %: 54.1 % (ref 43.0–77.0)
Platelets: 87 10*3/uL — ABNORMAL LOW (ref 150.0–400.0)
RBC: 4.24 Mil/uL (ref 4.22–5.81)
RDW: 13.3 % (ref 11.5–15.5)
WBC: 2.9 10*3/uL — ABNORMAL LOW (ref 4.0–10.5)

## 2023-02-14 LAB — COMPREHENSIVE METABOLIC PANEL
ALT: 31 U/L (ref 0–53)
AST: 20 U/L (ref 0–37)
Albumin: 4.1 g/dL (ref 3.5–5.2)
Alkaline Phosphatase: 56 U/L (ref 39–117)
BUN: 15 mg/dL (ref 6–23)
CO2: 32 mEq/L (ref 19–32)
Calcium: 9.3 mg/dL (ref 8.4–10.5)
Chloride: 105 mEq/L (ref 96–112)
Creatinine, Ser: 0.96 mg/dL (ref 0.40–1.50)
GFR: 82.03 mL/min (ref >60.00)
Glucose, Bld: 78 mg/dL (ref 70–99)
Potassium: 4.3 mEq/L (ref 3.5–5.1)
Sodium: 141 mEq/L (ref 135–145)
Total Bilirubin: 0.5 mg/dL (ref 0.2–1.2)
Total Protein: 6 g/dL (ref 6.0–8.3)

## 2023-02-14 LAB — LIPID PANEL
Cholesterol: 171 mg/dL (ref 0–200)
HDL: 59.7 mg/dL (ref >39.00)
LDL Cholesterol: 99 mg/dL (ref 0–99)
NonHDL: 111.42
Total CHOL/HDL Ratio: 3
Triglycerides: 62 mg/dL (ref 0.0–149.0)
VLDL: 12.4 mg/dL (ref 0.0–40.0)

## 2023-02-14 LAB — TSH: TSH: 2.5 u[IU]/mL (ref 0.35–5.50)

## 2023-02-14 LAB — HEMOGLOBIN A1C: Hgb A1c MFr Bld: 5.8 % (ref 4.6–6.5)

## 2023-02-14 NOTE — Progress Notes (Signed)
Chief Complaint  Patient presents with   Annual Exam    HPI: Patient  Brad Terry  67 y.o. comes in today for yearly visit  Has hx of OA and hs of r leg pain felt to be neurosx  RecurrentHA prev  eval  Hx of low platelets and leiukopenia  prev eval in 2012 hem  told to follow  ? Chornic itp? Vs other  stay around 80 - 100 and no bleeding 1 million steps global challenge Cataract surgery.  Right  .Marland Kitchen Has ptyrigeum    Hernia surgery  sonce last visit  Feels more forgetful at time s ( sleep interrupted and dec cause of ministry obligations)  wife [facility] and her mom dmentia?  In [facility]   Ha right side   ( not new but ongoing and has been to neuro in past for sx )  neuro eval in 2023 Dr Delena Bali  bran mri no acute findings  mild chronic microvascular change Has r side tooth problem Pain sensitivity ) Health Maintenance  Topic Date Due   COVID-19 Vaccine (1) Never done   Diabetic kidney evaluation - Urine ACR  Never done   Zoster Vaccines- Shingrix (1 of 2) Never done   DTaP/Tdap/Td (2 - Tdap) 01/10/2016   Pneumonia Vaccine 1+ Years old (1 of 1 - PCV) Never done   COLONOSCOPY (Pts 45-82yrs Insurance coverage will need to be confirmed)  02/09/2023   INFLUENZA VACCINE  06/06/2023   HEMOGLOBIN A1C  08/16/2023   Medicare Annual Wellness (AWV)  01/02/2024   Diabetic kidney evaluation - eGFR measurement  02/14/2024   Hepatitis C Screening  Completed   HPV VACCINES  Aged Out   Health Maintenance Review LIFESTYLE:  Exercise:  y some foot arthritis mtg  coping Tobacco/ETS:n Alcohol: n Sugar beverages:n Sleep: Drug use: no HH of  Work: Optician, dispensing   prayers  [place] [employer]      ROS: REST of 12 system review negative except as per HPI no bleeding   Past Medical History:  Diagnosis Date   Allergy    CHEST PAIN UNSPECIFIED 01/04/2010   Qualifier: Diagnosis of  By: Shirlee Latch, MD, [name]     Diverticulosis    GERD (gastroesophageal reflux disease)    Headaches due to old head  injury    Hyperlipidemia    Internal hemorrhoids    RECTAL BLEEDING 09/14/2010   Qualifier: Diagnosis of  By: Fabian Sharp MD, Neta Mends    Renal cyst    Rotator cuff (capsule) sprain 05/27/2013   Right     URTICARIA, ALLERGIC 12/30/2007   Qualifier: Diagnosis of  By: Clent Ridges MD, Tera Mater     Past Surgical History:  Procedure Laterality Date   COLONOSCOPY  12/27/2009   Brodie-hyperplastic polyp   KNEE ARTHROSCOPY     right   under the chin surgery       Family History  Problem Relation Age of Onset   Diabetes Mother    Diabetes Brother        x 2   Prostate cancer Brother        x 2   Colon cancer Neg Hx    Colon polyps Neg Hx    Esophageal cancer Neg Hx    Rectal cancer Neg Hx    Stomach cancer Neg Hx     Social History   Socioeconomic History   Marital status: Married    Spouse name: Verlee Monte   Number of children: Not on file  Years of education: Not on file   Highest education level: Not on file  Occupational History   Not on file  Tobacco Use   Smoking status: Never   Smokeless tobacco: Never  Vaping Use   Vaping Use: Never used  Substance and Sexual Activity   Alcohol use: No    Alcohol/week: 0.0 standard drinks of alcohol   Drug use: No   Sexual activity: Not on file  Other Topics Concern   Not on file  Social History Narrative   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   Tries to walk with exercise      [place] of origin      Caffeine- seldom    Social Determinants of Corporate investment banker Strain: Not on file  Food Insecurity: Not on file  Transportation Needs: Not on file  Physical Activity: Not on file  Stress: Not on file  Social Connections: Not on file    Outpatient Medications Prior to Visit  Medication Sig Dispense Refill   Ascorbic Acid (VITAMIN C) 1000 MG tablet Take 1,000 mg by mouth daily.     doxycycline (VIBRAMYCIN) 100 MG capsule Take 100 mg by mouth 2 (two) times daily.     ibuprofen  (ADVIL) 800 MG tablet Take 800 mg by mouth every 8 (eight) hours as needed.     Multiple Vitamins-Minerals (ZINC PO) Take by mouth.     celecoxib (CELEBREX) 100 MG capsule Take 1 capsule (100 mg total) by mouth 2 (two) times daily. (Patient not taking: Reported on 01/01/2023) 60 capsule 3   famotidine (PEPCID) 20 MG tablet Take 1 tablet (20 mg total) by mouth 2 (two) times daily. 60 tablet 0   cholecalciferol (VITAMIN D3) 25 MCG (1000 UNIT) tablet Take 2,000 Units by mouth daily. (Patient not taking: Reported on 01/01/2023)     mefloquine (LARIAM) 250 MG tablet Take 250 mg by mouth once a week. (Patient not taking: Reported on 01/01/2023)     omeprazole (PRILOSEC) 40 MG capsule Take 1 capsule (40 mg total) by mouth daily. (Patient not taking: Reported on 01/01/2023) 30 capsule 1   No facility-administered medications prior to visit.     EXAM:  BP 120/70 (BP Location: Left Arm, Patient Position: Sitting, Cuff Size: Large)   Pulse (!) 58   Temp 98 F (36.7 C) (Oral)   Ht 5\' 8"  (1.727 m)   Wt 197 lb 3.2 oz (89.4 kg)   SpO2 98%   BMI 29.98 kg/m   Body mass index is 29.98 kg/m. Wt Readings from Last 3 Encounters:  02/14/23 197 lb 3.2 oz (89.4 kg)  01/01/23 195 lb (88.5 kg)  04/10/22 204 lb (92.5 kg)    Physical Exam: Vital signs reviewed [id number] is a well-developed well-nourished alert cooperative    who appearsr stated age in no acute distress.  HEENT: normocephalic atraumatic , Eyes: PERRL EOM's full, conjunctiva clear, ptyrigieum  Nares: paten,t no deformity discharge or tenderness., Ears: no deformity EAC's clear TMs with normal landmarks. Mouth: clear OP, no lesions, edema.  Moist mucous membranes. Dentition in adequate repair. NECK: supple without masses, thyromegaly or bruits. CHEST/PULM:  Clear to auscultation and percussion breath sounds equal no wheeze , rales or rhonchi CV: PMI is nondisplaced, S1 S2 no gallops, murmurs, rubs. Peripheral pulses are full without delay.No JVD .   ABDOMEN: Bowel sounds normal nontender  No guard or rebound, no hepato splenomegal no CVA tenderness.   Extremtities:  No clubbing cyanosis or edema, no acute joint swelling or redness no focal atrophy NEURO:  Oriented x3, cranial nerves 3-12 appear to be intact, no obvious focal weakness,gait within normal limits no abnormal reflexes or asymmetrical SKIN: No acute rashes normal turgor, color, no bruising or petechiae. PSYCH: Oriented, good eye contact, no obvious depression anxiety, cognition and judgment appear normal. LN: no cervical axillary adenopathy  Lab Results  Component Value Date   WBC 2.9 (L) 02/14/2023   HGB 12.9 (L) 02/14/2023   HCT 38.2 (L) 02/14/2023   PLT 87.0 (L) 02/14/2023   GLUCOSE 78 02/14/2023   CHOL 171 02/14/2023   TRIG 62.0 02/14/2023   HDL 59.70 02/14/2023   LDLDIRECT 140.2 07/31/2012   LDLCALC 99 02/14/2023   ALT 31 02/14/2023   AST 20 02/14/2023   NA 141 02/14/2023   K 4.3 02/14/2023   CL 105 02/14/2023   CREATININE 0.96 02/14/2023   BUN 15 02/14/2023   CO2 32 02/14/2023   TSH 2.50 02/14/2023   PSA 1.36 02/14/2023   HGBA1C 5.8 02/14/2023    BP Readings from Last 3 Encounters:  02/14/23 120/70  04/10/22 118/74  03/08/22 118/82   The 10-year ASCVD risk score (Arnett DK, et al., 2019) is: 15.8%   Values used to calculate the score:     Age: 18 years     Sex: Male     Is Non-Hispanic African American: Yes     Diabetic: Yes     Tobacco smoker: No     Systolic Blood Pressure: 120 mmHg     Is BP treated: No     HDL Cholesterol: 59.7 mg/dL     Total Cholesterol: 171 mg/dL  Lab plan reviewed with patient   ASSESSMENT AND PLAN:  Discussed the following assessment and plan:    ICD-10-CM   1. Medication management  Z79.899 CBC with Differential/Platelet    Comprehensive metabolic panel    Lipid panel    PSA    TSH    Hemoglobin A1c    2. Thrombocytopenia (HCC)  D69.6 CBC with Differential/Platelet    Comprehensive metabolic panel     Lipid panel    PSA    TSH    Hemoglobin A1c    3. Gastroesophageal reflux disease, unspecified whether esophagitis present  K21.9 CBC with Differential/Platelet    Comprehensive metabolic panel    Lipid panel    PSA    TSH    Hemoglobin A1c    4. Lymphopenia  D72.810 CBC with Differential/Platelet    Comprehensive metabolic panel    Lipid panel    PSA    TSH    Hemoglobin A1c    5. Primary osteoarthritis of right hip  M16.11 CBC with Differential/Platelet    Comprehensive metabolic panel    Lipid panel    PSA    TSH    Hemoglobin A1c    6. Hyperlipidemia, unspecified hyperlipidemia type  E78.5 CBC with Differential/Platelet    Comprehensive metabolic panel    Lipid panel    PSA    TSH    Hemoglobin A1c   med risk  per ascvd model    7. Benign prostatic hyperplasia with urinary frequency  N40.1 PSA   R35.0     8. Screening PSA (prostate specific antigen)  Z12.5 PSA    9. Complaints of memory disturbance  R41.3    prev  seen neuro for has and memory  currently sleep disruption professional may be contributing can so  sage 1 HO given can send in to score and fu if needed    Continue healthy life style try top optimize sleep . Update lab today  See dentist if ha other progressive can reevaluate  Consider statin rx if appropriate depending lab results Had neuroooeval with mri early 2023 ? If nay follow up consider nerve block? Dont know if ever had fu? After mri done  Return for depending on results 4-6 mos.  Patient Care Team: Jolly Bleicher, Neta MendsWanda K, MD as PCP - General Si GaulMohamed, Mohamed, MD (Hematology and Oncology) Patient Instructions  Can look a t sage  tests on line  for self screen memory . Continue lifestyle intervention healthy eating and exercise .   Work on sleep optimization  . Get dental check . For tooth pain . Could be related to triggering headache . Lab today and go form there . Plan follow up after labs in 4-6 months depending on how memory headaches are  doing.    Neta MendsWanda K. Sharice Harriss M.D.

## 2023-02-14 NOTE — Patient Instructions (Signed)
Can look a t sage  tests on line  for self screen memory . Continue lifestyle intervention healthy eating and exercise .   Work on sleep optimization  . Get dental check . For tooth pain . Could be related to triggering headache . Lab today and go form there . Plan follow up after labs in 4-6 months depending on how memory headaches are doing.

## 2023-02-17 NOTE — Progress Notes (Signed)
Cholesterol  improved .  Favorable.  Blood count  mostly stable  abnormalities with low wbc platelets  borderline hg . Kidneys liver  blood sugar  all good stable  Suggest  recheck cbcdiff in 4-6 months to ensure stability .  Upon record review  I noted you saw neurology with mri jan 2023 . There was a mention about nerve block possible to help headaches   If you didn't follow up ou may want to return to neurlogy to discuss any interventions that cold help the face headache pain .

## 2023-02-18 ENCOUNTER — Other Ambulatory Visit: Payer: Self-pay

## 2023-02-18 DIAGNOSIS — D696 Thrombocytopenia, unspecified: Secondary | ICD-10-CM

## 2023-02-21 ENCOUNTER — Ambulatory Visit: Payer: Medicare Other | Admitting: Internal Medicine

## 2023-03-03 NOTE — Progress Notes (Signed)
Nothing to do different about the blood count but if gets  fevers for  unknown cause or abnormal bleeding  then  get medical check .

## 2023-03-05 ENCOUNTER — Ambulatory Visit: Payer: Medicare Other | Admitting: Internal Medicine

## 2023-03-06 ENCOUNTER — Encounter: Payer: Self-pay | Admitting: Gastroenterology

## 2023-05-27 NOTE — Progress Notes (Unsigned)
05/28/2023 Brad Terry [id number] 03-03-1956  Referring provider: Madelin Headings, MD Primary GI doctor: Dr. Lavon Paganini  ASSESSMENT AND PLAN:   Gastroesophageal reflux disease without esophagitis Previous EGD 2011, irregular Z-line however negative for Barrett's 2020 barium swallow no stenosis/stricture Continuing with worsening GERD, occasional right and left upper abdominal discomfort, occasional dysphagia can be solids or liquids. Will repeat upper endoscopy with possible dilation with colonoscopy, start on proton pump inhibitor Prilosec 40 mg once daily, if EGD unremarkable can repeat barium swallow if not responding to PPI. Will set up for abdominal ultrasound to evaluate gallbladder/spleen/liver with abdominal discomfort. Diet discussed I discussed risks of EGD with patient today, including risk of sedation, bleeding or perforation.  Patient provides understanding and gave verbal consent to proceed.  History of adenomatous polyp of colon Colonoscopy 2021 Dr. Lavon Paganini, recall 3 years patient's due.  Will schedule for colonoscopy with EGD with Dr. Lavon Paganini at [facility] We have discussed the risks of bleeding, infection, perforation, medication reactions, and remote risk of death associated with colonoscopy. All questions were answered and the patient acknowledges these risk and wishes to proceed.  DIVERTICULOSIS, COLON Will call if any symptoms. Add on fiber supplement, avoid NSAIDS, information given  Other pancytopenia (HCC) Repeat CBC, monitor    Patient Care Team: Panosh, Neta Mends, MD as PCP - General Si Gaul, MD (Hematology and Oncology)  HISTORY OF PRESENT ILLNESS: 67 y.o. male with a past medical history of hyperlipidemia, GERD, diverticulosis, internal hemorrhoids, history of ITP follows with hematology and others listed below presents for evaluation of reflux, patient is also due for recall colon..   EGD 2011 irregular Zline but negative for  barret's, negative H pylori 01/15/2019 barium swallow with tablet for dysphagia showed no aspiration, normal motility negative for stricture or mass. 02/09/2020 colonoscopy with Dr. Lavon Paganini for screening purposes excellent bowel prep 3 polyps 5 to 9 mm transverse and ascending colon, 1 polyp less than 1 mm ascending colon, mild diverticulosis, nonbleeding internal hemorrhoids.  Precancerous polyps recall 3 years.  He has been having intermittent GERD but has been getting worse. He states worse with any food, acidic and greasy.  He has had a few times that he has had tablets or meats get stuck but can happen very rarely with liquids.  He denies nocturnal symptoms. He walks 10 miles without CP or SOB.  He has BM daily, no melena, no hematochezia.  No unintentional weight loss.  Has some tenderness around right inguinal hernia repair that was done 08/2022, rare pain.   He denies blood thinner use.  He reports NSAID use, ibuprofen 800mg  will take once a month, just as needed, rare.  He denies ETOH use.   He denies tobacco use.  He denies drug use.    He  reports that he has never smoked. He has never used smokeless tobacco. He reports that he does not drink alcohol and does not use drugs.  RELEVANT LABS AND IMAGING: CBC    Component Value Date/Time   WBC 2.9 (L) 02/14/2023 1125   RBC 4.24 02/14/2023 1125   HGB 12.9 (L) 02/14/2023 1125   HGB 12.1 (L) 03/20/2019 1115   HGB 13.2 11/29/2010 1432   HCT 38.2 (L) 02/14/2023 1125   HCT 36.5 (L) 03/20/2019 1115   HCT 39.2 11/29/2010 1432   PLT 87.0 (L) 02/14/2023 1125   PLT 108 (L) 11/29/2010 1432   MCV 90.1 02/14/2023 1125   MCV 90 03/20/2019 1115   MCV 85.2  11/29/2010 1432   MCH 29.9 03/20/2019 1115   MCH 28.7 11/29/2010 1432   MCHC 33.8 02/14/2023 1125   RDW 13.3 02/14/2023 1125   RDW 13.0 03/20/2019 1115   RDW 13.4 11/29/2010 1432   LYMPHSABS 1.0 02/14/2023 1125   LYMPHSABS 1.3 03/20/2019 1115   LYMPHSABS 1.4 11/29/2010 1432    MONOABS 0.2 02/14/2023 1125   MONOABS 0.3 11/29/2010 1432   EOSABS 0.1 02/14/2023 1125   EOSABS 0.1 03/20/2019 1115   BASOSABS 0.0 02/14/2023 1125   BASOSABS 0.0 03/20/2019 1115   BASOSABS 0.0 11/29/2010 1432   Recent Labs    02/14/23 1125  HGB 12.9*    CMP     Component Value Date/Time   NA 141 02/14/2023 1125   NA 140 03/20/2019 1115   K 4.3 02/14/2023 1125   CL 105 02/14/2023 1125   CO2 32 02/14/2023 1125   GLUCOSE 78 02/14/2023 1125   BUN 15 02/14/2023 1125   BUN 12 03/20/2019 1115   CREATININE 0.96 02/14/2023 1125   CALCIUM 9.3 02/14/2023 1125   PROT 6.0 02/14/2023 1125   PROT 6.4 03/20/2019 1115   ALBUMIN 4.1 02/14/2023 1125   ALBUMIN 4.2 03/20/2019 1115   AST 20 02/14/2023 1125   ALT 31 02/14/2023 1125   ALKPHOS 56 02/14/2023 1125   BILITOT 0.5 02/14/2023 1125   BILITOT 0.3 03/20/2019 1115   GFRNONAA 88 03/20/2019 1115   GFRAA 102 03/20/2019 1115      Latest Ref Rng & Units 02/14/2023   11:25 AM 07/13/2021   10:08 AM 08/19/2019    3:53 PM  Hepatic Function  Total Protein 6.0 - 8.3 g/dL 6.0  6.6  6.6   Albumin 3.5 - 5.2 g/dL 4.1  4.2  4.4   AST 0 - 37 U/L 20  18  17    ALT 0 - 53 U/L 31  17  12    Alk Phosphatase 39 - 117 U/L 56  58  47   Total Bilirubin 0.2 - 1.2 mg/dL 0.5  0.6  0.6   Bilirubin, Direct 0.0 - 0.3 mg/dL  0.1  0.1       Current Medications:      Current Outpatient Medications (Analgesics):    ibuprofen (ADVIL) 800 MG tablet, Take 800 mg by mouth every 8 (eight) hours as needed.   Current Outpatient Medications (Other):    Na Sulfate-K Sulfate-Mg Sulf 17.5-3.13-1.6 GM/177ML SOLN, Take 1 kit by mouth once for 1 dose.   omeprazole (PRILOSEC) 40 MG capsule, Take 1 capsule (40 mg total) by mouth daily before breakfast.   Medical History:  Past Medical History:  Diagnosis Date   Allergy    CHEST PAIN UNSPECIFIED 01/04/2010   Qualifier: Diagnosis of  By: Shirlee Latch, MD, [name]     Diabetes [facility])    walked and lost weight and diabetes  went away per patient   Diverticulosis    GERD (gastroesophageal reflux disease)    Headaches due to old head injury    Hyperlipidemia    Internal hemorrhoids    RECTAL BLEEDING 09/14/2010   Qualifier: Diagnosis of  By: Fabian Sharp MD, Neta Mends    Renal cyst    Rotator cuff (capsule) sprain 05/27/2013   Right     Sleep apnea    URTICARIA, ALLERGIC 12/30/2007   Qualifier: Diagnosis of  By: Clent Ridges MD, Tera Mater    Allergies:  Allergies  Allergen Reactions   Advil [Ibuprofen] Hives   Naproxen     Able to  take aleve, tinnitus   Piroxicam     REACTION: tinnitus   Statins     Hx of se of 2 statins    Tylenol [Acetaminophen] Hives     Surgical History:  He  has a past surgical history that includes Knee arthroscopy; under the chin surgery ; Colonoscopy (12/27/2009); and Inguinal hernia repair (Right, 09/2022). Family History:  His family history includes Diabetes in his brother and mother; Prostate cancer in his brother.  REVIEW OF SYSTEMS  : All other systems reviewed and negative except where noted in the History of Present Illness.  PHYSICAL EXAM: BP 104/64   Pulse (!) 56   Ht 5\' 8"  (1.727 m)   Wt 207 lb (93.9 kg)   BMI 31.47 kg/m  General Appearance: Well nourished, in no apparent distress. Head:   Normocephalic and atraumatic. Eyes:  sclerae anicteric,conjunctive pink  Respiratory: Respiratory effort normal, BS equal bilaterally without rales, rhonchi, wheezing. Cardio: RRR with no MRGs. Peripheral pulses intact.  Abdomen: Soft,  Non-distended ,active bowel sounds. mild tenderness in the RUQ and in the LUQ. Without guarding and Without rebound. No masses. Rectal: Not evaluated Musculoskeletal: Full ROM, Normal gait. Without edema. Skin:  Dry and intact without significant lesions or rashes Neuro: Alert and  oriented x4;  No focal deficits. Psych:  Cooperative. Normal mood and affect.    Doree Albee, PA-C 12:14 PM

## 2023-05-28 ENCOUNTER — Other Ambulatory Visit (INDEPENDENT_AMBULATORY_CARE_PROVIDER_SITE_OTHER): Payer: Medicare Other

## 2023-05-28 ENCOUNTER — Ambulatory Visit (INDEPENDENT_AMBULATORY_CARE_PROVIDER_SITE_OTHER): Payer: Medicare Other | Admitting: Physician Assistant

## 2023-05-28 ENCOUNTER — Encounter: Payer: Self-pay | Admitting: Physician Assistant

## 2023-05-28 VITALS — BP 104/64 | HR 56 | Ht 68.0 in | Wt 207.0 lb

## 2023-05-28 DIAGNOSIS — D61818 Other pancytopenia: Secondary | ICD-10-CM | POA: Diagnosis not present

## 2023-05-28 DIAGNOSIS — K219 Gastro-esophageal reflux disease without esophagitis: Secondary | ICD-10-CM

## 2023-05-28 DIAGNOSIS — Z86010 Personal history of colonic polyps: Secondary | ICD-10-CM

## 2023-05-28 DIAGNOSIS — R1314 Dysphagia, pharyngoesophageal phase: Secondary | ICD-10-CM | POA: Diagnosis not present

## 2023-05-28 DIAGNOSIS — R1012 Left upper quadrant pain: Secondary | ICD-10-CM | POA: Diagnosis not present

## 2023-05-28 DIAGNOSIS — K5730 Diverticulosis of large intestine without perforation or abscess without bleeding: Secondary | ICD-10-CM

## 2023-05-28 LAB — COMPREHENSIVE METABOLIC PANEL
ALT: 18 U/L (ref 0–53)
AST: 19 U/L (ref 0–37)
Albumin: 4.1 g/dL (ref 3.5–5.2)
Alkaline Phosphatase: 52 U/L (ref 39–117)
BUN: 18 mg/dL (ref 6–23)
CO2: 30 mEq/L (ref 19–32)
Calcium: 9.1 mg/dL (ref 8.4–10.5)
Chloride: 105 mEq/L (ref 96–112)
Creatinine, Ser: 1.07 mg/dL (ref 0.40–1.50)
GFR: 71.87 mL/min (ref >60.00)
Glucose, Bld: 82 mg/dL (ref 70–99)
Potassium: 4.1 mEq/L (ref 3.5–5.1)
Sodium: 140 mEq/L (ref 135–145)
Total Bilirubin: 0.5 mg/dL (ref 0.2–1.2)
Total Protein: 6.5 g/dL (ref 6.0–8.3)

## 2023-05-28 LAB — CBC WITH DIFFERENTIAL/PLATELET
Basophils Absolute: 0 10*3/uL (ref 0.0–0.1)
Basophils Relative: 0.5 % (ref 0.0–3.0)
Eosinophils Absolute: 0.2 10*3/uL (ref 0.0–0.7)
Eosinophils Relative: 5.6 % — ABNORMAL HIGH (ref 0.0–5.0)
HCT: 37.6 % — ABNORMAL LOW (ref 39.0–52.0)
Hemoglobin: 12.4 g/dL — ABNORMAL LOW (ref 13.0–17.0)
Lymphocytes Relative: 35.5 % (ref 12.0–46.0)
Lymphs Abs: 1.1 10*3/uL (ref 0.7–4.0)
MCHC: 33 g/dL (ref 30.0–36.0)
MCV: 90.3 fl (ref 78.0–100.0)
Monocytes Absolute: 0.3 10*3/uL (ref 0.1–1.0)
Monocytes Relative: 8.4 % (ref 3.0–12.0)
Neutro Abs: 1.6 10*3/uL (ref 1.4–7.7)
Neutrophils Relative %: 50 % (ref 43.0–77.0)
Platelets: 79 10*3/uL — ABNORMAL LOW (ref 150.0–400.0)
RBC: 4.16 Mil/uL — ABNORMAL LOW (ref 4.22–5.81)
RDW: 13.3 % (ref 11.5–15.5)
WBC: 3.2 10*3/uL — ABNORMAL LOW (ref 4.0–10.5)

## 2023-05-28 MED ORDER — OMEPRAZOLE 40 MG PO CPDR: 40.0000 mg | DELAYED_RELEASE_CAPSULE | Freq: Every day | ORAL | 3 refills | Status: DC

## 2023-05-28 MED ORDER — NA SULFATE-K SULFATE-MG SULF 17.5-3.13-1.6 GM/177ML PO SOLN: 1.0000 | Freq: Once | ORAL | 0 refills | Status: AC

## 2023-05-28 NOTE — Patient Instructions (Addendum)
Your provider has requested that you go to the basement level for lab work before leaving today. Press "B" on the elevator. The lab is located at the first door on the left as you exit the elevator.  You have been scheduled for an abdominal ultrasound at [facility] (1st floor of hospital) on 07/09/2023 at 8:30am. Please arrive 15 minutes prior to your appointment for registration. Make certain not to have anything to eat or drink 6 hours prior to your appointment. Should you need to reschedule your appointment, please contact radiology at [phone]. This test typically takes about 30 minutes to perform. You have been scheduled for a colonoscopy. Please follow written instructions given to you at your visit today.   Please pick up your prep supplies at the pharmacy within the next 1-3 days.  If you use inhalers (even only as needed), please bring them with you on the day of your procedure.  DO NOT TAKE 7 DAYS PRIOR TO TEST- Trulicity (dulaglutide) Ozempic, Wegovy (semaglutide) Mounjaro (tirzepatide) Bydureon Bcise (exanatide extended release)  DO NOT TAKE 1 DAY PRIOR TO YOUR TEST Rybelsus (semaglutide) Adlyxin (lixisenatide) Victoza (liraglutide) Byetta (exanatide) ___________________________________________________________________________   Dysphagia precautions:  1. Take reflux medications 30+ minutes before food in the morning, get on prilosec 40 mg daily.  2. Begin meals with warm beverage 3. Eat smaller more frequent meals 4. Eat slowly, taking small bites and sips 5. Alternate solids and liquids 6. Avoid foods/liquids that increase acid production 7. Sit upright during and for 30+ minutes after meals to facilitate esophageal clearing 8. All meats should be chopped finely.   If something gets hung in your esophagus and will not come up or go down, proceed to the emergency room.    Diverticulosis Diverticulosis is a condition that develops when small pouches  (diverticula) form in the wall of the large intestine (colon). The colon is where water is absorbed and stool (feces) is formed. The pouches form when the inside layer of the colon pushes through weak spots in the outer layers of the colon. You may have a few pouches or many of them. The pouches usually do not cause problems unless they become inflamed or infected. When this happens, the condition is called diverticulitis- this is left lower quadrant pain, diarrhea, fever, chills, nausea or vomiting.  If this occurs please call the office or go to the hospital. Sometimes these patches without inflammation can also have painless bleeding associated with them, if this happens please call the office or go to the hospital. Preventing constipation and increasing fiber can help reduce diverticula and prevent complications. Even if you feel you have a high-fiber diet, suggest getting on Benefiber or Cirtracel 2 times daily.  _______________________________________________________  If your blood pressure at your visit was 140/90 or greater, please contact your primary care physician to follow up on this.  _______________________________________________________  If you are age 4 or older, your body mass index should be between 23-30. Your Body mass index is 31.47 kg/m. If this is out of the aforementioned range listed, please consider follow up with your Primary Care Provider.  If you are age 25 or younger, your body mass index should be between 19-25. Your Body mass index is 31.47 kg/m. If this is out of the aformentioned range listed, please consider follow up with your Primary Care Provider.   ________________________________________________________  The [facility] providers would like to encourage you to use [facility] to communicate with providers for non-urgent requests or questions.  Due to long hold times on the telephone, sending your provider a message by [facility] may be a faster and more efficient  way to get a response.  Please allow 48 business hours for a response.  Please remember that this is for non-urgent requests.  _______________________________________________________ It was a pleasure to see you today!  Thank you for trusting me with your gastrointestinal care!

## 2023-05-29 ENCOUNTER — Other Ambulatory Visit (INDEPENDENT_AMBULATORY_CARE_PROVIDER_SITE_OTHER): Payer: Medicare Other

## 2023-05-29 DIAGNOSIS — D509 Iron deficiency anemia, unspecified: Secondary | ICD-10-CM

## 2023-05-29 LAB — IBC + FERRITIN
Ferritin: 242 ng/mL (ref 22.0–322.0)
Iron: 75 ug/dL (ref 42–165)
Saturation Ratios: 23.5 % (ref 20.0–50.0)
TIBC: 319.2 ug/dL (ref 250.0–450.0)
Transferrin: 228 mg/dL (ref 212.0–360.0)

## 2023-05-29 LAB — VITAMIN B12: Vitamin B-12: 374 pg/mL (ref 211–911)

## 2023-06-17 ENCOUNTER — Other Ambulatory Visit: Payer: Medicare Other

## 2023-06-18 ENCOUNTER — Ambulatory Visit (INDEPENDENT_AMBULATORY_CARE_PROVIDER_SITE_OTHER): Payer: Medicare Other | Admitting: Internal Medicine

## 2023-06-18 ENCOUNTER — Ambulatory Visit (INDEPENDENT_AMBULATORY_CARE_PROVIDER_SITE_OTHER): Payer: Medicare Other

## 2023-06-18 ENCOUNTER — Other Ambulatory Visit: Payer: Medicare Other

## 2023-06-18 ENCOUNTER — Encounter: Payer: Self-pay | Admitting: Internal Medicine

## 2023-06-18 VITALS — BP 108/64 | HR 58 | Temp 98.3°F | Ht 68.0 in | Wt 207.8 lb

## 2023-06-18 DIAGNOSIS — D696 Thrombocytopenia, unspecified: Secondary | ICD-10-CM

## 2023-06-18 DIAGNOSIS — M79601 Pain in right arm: Secondary | ICD-10-CM

## 2023-06-18 DIAGNOSIS — L989 Disorder of the skin and subcutaneous tissue, unspecified: Secondary | ICD-10-CM | POA: Diagnosis not present

## 2023-06-18 DIAGNOSIS — M542 Cervicalgia: Secondary | ICD-10-CM

## 2023-06-18 LAB — CBC WITH DIFFERENTIAL/PLATELET
Basophils Absolute: 0 10*3/uL (ref 0.0–0.1)
Basophils Relative: 0.4 % (ref 0.0–3.0)
Eosinophils Absolute: 0.2 10*3/uL (ref 0.0–0.7)
Eosinophils Relative: 4.5 % (ref 0.0–5.0)
HCT: 38 % — ABNORMAL LOW (ref 39.0–52.0)
Hemoglobin: 12.6 g/dL — ABNORMAL LOW (ref 13.0–17.0)
Lymphocytes Relative: 33.5 % (ref 12.0–46.0)
Lymphs Abs: 1.1 10*3/uL (ref 0.7–4.0)
MCHC: 33 g/dL (ref 30.0–36.0)
MCV: 90.2 fl (ref 78.0–100.0)
Monocytes Absolute: 0.3 10*3/uL (ref 0.1–1.0)
Monocytes Relative: 8.1 % (ref 3.0–12.0)
Neutro Abs: 1.8 10*3/uL (ref 1.4–7.7)
Neutrophils Relative %: 53.5 % (ref 43.0–77.0)
Platelets: 91 10*3/uL — ABNORMAL LOW (ref 150.0–400.0)
RBC: 4.21 Mil/uL — ABNORMAL LOW (ref 4.22–5.81)
RDW: 13.9 % (ref 11.5–15.5)
WBC: 3.4 10*3/uL — ABNORMAL LOW (ref 4.0–10.5)

## 2023-06-18 MED ORDER — TIZANIDINE HCL 4 MG PO CAPS: 4.0000 mg | ORAL_CAPSULE | Freq: Every evening | ORAL | 1 refills | Status: DC | PRN

## 2023-06-18 NOTE — Progress Notes (Signed)
Chief Complaint  Patient presents with   Neck Pain    Pt c/o neck pain on R side. Going on two months. Pt would like to discuss his pain on R side. From R foot to R side of head.     HPI: Brad Terry 67 y.o. come in for   on going  unrelieved pain  R neck  2-3 months  down to right shoulder area upper arm but not to hand  No neck  injury . Can be assoc with radiating to right side of head ( headache)  feels r side of body back r leg is a problem  Pain level 7 /89max    some nausea  ocass ibuprofen  not too much to avoid se .  Pain aggravated with certain motions . He is interested with imagine  Active no new neuro sx  has seen neuro about headaches in past.   ROS: See pertinent positives and negatives per HPI. Also check itchy are scalp left parietal and feels swell?  Then pain?  Also bump right uppper forehead  not growing a lot but scaly?   Past Medical History:  Diagnosis Date   Allergy    CHEST PAIN UNSPECIFIED 01/04/2010   Qualifier: Diagnosis of  By: Shirlee Latch, MD, [name]     Diabetes [facility])    walked and lost weight and diabetes went away per patient   Diverticulosis    GERD (gastroesophageal reflux disease)    Headaches due to old head injury    Hyperlipidemia    Internal hemorrhoids    RECTAL BLEEDING 09/14/2010   Qualifier: Diagnosis of  By: Fabian Sharp MD, Neta Mends    Renal cyst    Rotator cuff (capsule) sprain 05/27/2013   Right     Sleep apnea    URTICARIA, ALLERGIC 12/30/2007   Qualifier: Diagnosis of  By: Clent Ridges MD, Tera Mater     Family History  Problem Relation Age of Onset   Diabetes Mother    Diabetes Brother        x 2   Prostate cancer Brother        x 2   Colon cancer Neg Hx    Colon polyps Neg Hx    Esophageal cancer Neg Hx    Rectal cancer Neg Hx    Stomach cancer Neg Hx     Social History   Socioeconomic History   Marital status: Married    Spouse name: [name]   Number of children: 4   Years of education: Not on file   Highest  education level: Not on file  Occupational History   Occupation: Education officer, environmental  Tobacco Use   Smoking status: Never   Smokeless tobacco: Never  Vaping Use   Vaping status: Never Used  Substance and Sexual Activity   Alcohol use: No    Alcohol/week: 0.0 standard drinks of alcohol   Drug use: No   Sexual activity: Not on file  Other Topics Concern   Not on file  Social History Narrative   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   Tries to walk with exercise      [place] country of origin      Caffeine- seldom    Social Determinants of Corporate investment banker Strain: Not on file  Food Insecurity: Not on file  Transportation Needs: Not on file  Physical Activity: Not on file  Stress: Not on file  Social Connections: Not on  file    Outpatient Medications Prior to Visit  Medication Sig Dispense Refill   ibuprofen (ADVIL) 800 MG tablet Take 800 mg by mouth every 8 (eight) hours as needed.     omeprazole (PRILOSEC) 40 MG capsule Take 1 capsule (40 mg total) by mouth daily before breakfast. (Patient not taking: Reported on 06/18/2023) 90 capsule 3   No facility-administered medications prior to visit.     EXAM:  BP 108/64 (BP Location: Left Arm, Patient Position: Sitting, Cuff Size: Large)   Pulse (!) 58   Temp 98.3 F (36.8 C) (Oral)   Ht 5\' 8"  (1.727 m)   Wt 207 lb 12.8 oz (94.3 kg)   SpO2 96%   BMI 31.60 kg/m   Body mass index is 31.6 kg/m.  GENERAL: vitals reviewed and listed above, alert, oriented, appears well hydrated and in no acute distress HEENT: atraumatic, conjunctiva  clear, no obvious abnormalities on inspection of external nose and ears  Skin scalp hard to find 2-3 mm excoriation left parietal are and r forehead darker  round scaly bump 2 -3 mm  NECK: no obvious masses on inspection palpation   no midline tednerness but at right occipital trapezious area radiating to shoulder   inc with right rom and stretching left  no  masses felt   MS: moves all extremities without noticeable focal  abnormality no obvious  atrophy  PSYCH: pleasant and cooperative, no obvious depression or anxiety Lab Results  Component Value Date   WBC 3.4 (L) 06/18/2023   HGB 12.6 (L) 06/18/2023   HCT 38.0 (L) 06/18/2023   PLT 91.0 (L) 06/18/2023   GLUCOSE 82 05/28/2023   CHOL 171 02/14/2023   TRIG 62.0 02/14/2023   HDL 59.70 02/14/2023   LDLDIRECT 140.2 07/31/2012   LDLCALC 99 02/14/2023   ALT 18 05/28/2023   AST 19 05/28/2023   NA 140 05/28/2023   K 4.1 05/28/2023   CL 105 05/28/2023   CREATININE 1.07 05/28/2023   BUN 18 05/28/2023   CO2 30 05/28/2023   TSH 2.50 02/14/2023   PSA 1.36 02/14/2023   HGBA1C 5.8 02/14/2023   BP Readings from Last 3 Encounters:  06/18/23 108/64  05/28/23 104/64  02/14/23 120/70  See previous notes  eval for HA  no recent  neck imaging  mri head 2023 NAfinding  ASSESSMENT AND PLAN:  Discussed the following assessment and plan:  Neck pain on right side - Plan: DG Cervical Spine Complete  Pain of right upper extremity - Plan: DG Cervical Spine Complete  Thrombocytopenia (HCC) - Plan: CBC with Differential/Platelet  Skin lesion - uncertain scalp and forehead lesion  seems benign but  if growing on going see derm disc HCS otc for otching and if persist se derm Uncertain cause but radiating to head and arm poss  radicular or arthritis  cause .  Dg x ray today  and prefer MRI depnding on findings   problem has been fro months and  not improving  -Patient advised to return or notify health care team  if  new concerns arise.  Patient Instructions  X ray today and then mri of neck as indicated .  Can try muscle relaxant at night for now in interim . Caution with drowsiness Consider Physical therapy and or  referral to spine ortho specialist .    Neta Mends. Zohan Shiflet M.D.

## 2023-06-18 NOTE — Patient Instructions (Addendum)
X ray today and then mri of neck as indicated .  Can try muscle relaxant at night for now in interim . Caution with drowsiness Consider Physical therapy and or  referral to spine ortho specialist .

## 2023-06-18 NOTE — Progress Notes (Signed)
Results stable  slightly improve platelets up to 91k. Mild anemia   I would like  you to  see  hematology again to get an updated  opinion about your blood count  . I think the last one was  in 20 12  and this may be normal for you but I would prefer get updated  opinion about following this condition.  If you are willing staff please do referral  order to hematology about( low platelets and anemia  chronic )

## 2023-06-20 ENCOUNTER — Other Ambulatory Visit: Payer: Medicare Other

## 2023-06-20 ENCOUNTER — Other Ambulatory Visit: Payer: Self-pay

## 2023-06-20 DIAGNOSIS — D696 Thrombocytopenia, unspecified: Secondary | ICD-10-CM

## 2023-06-26 NOTE — Progress Notes (Signed)
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 Please order a c spine MRI  dx degenerative of c spine.   Right arm pain and neck pain  And refer to neurosurgery

## 2023-06-27 ENCOUNTER — Telehealth: Payer: Self-pay | Admitting: Internal Medicine

## 2023-06-27 NOTE — Telephone Encounter (Signed)
Pt's spouse called to request a call back to go over Pt's results from 06/18/23 and 06/20/23.

## 2023-06-28 ENCOUNTER — Other Ambulatory Visit: Payer: Self-pay

## 2023-06-28 DIAGNOSIS — M542 Cervicalgia: Secondary | ICD-10-CM

## 2023-06-28 DIAGNOSIS — M79601 Pain in right arm: Secondary | ICD-10-CM

## 2023-06-28 DIAGNOSIS — M47812 Spondylosis without myelopathy or radiculopathy, cervical region: Secondary | ICD-10-CM

## 2023-06-28 NOTE — Telephone Encounter (Signed)
Spoke to wife. See lab result note.

## 2023-07-09 ENCOUNTER — Ambulatory Visit (HOSPITAL_COMMUNITY)
Admission: RE | Admit: 2023-07-09 | Discharge: 2023-07-09 | Disposition: A | Discharge status: Home / Self Care | Payer: Medicare Other | Source: Ambulatory Visit | Attending: Physician Assistant | Admitting: Physician Assistant

## 2023-07-09 DIAGNOSIS — K219 Gastro-esophageal reflux disease without esophagitis: Secondary | ICD-10-CM | POA: Insufficient documentation

## 2023-07-17 ENCOUNTER — Other Ambulatory Visit: Payer: Self-pay | Admitting: Physician Assistant

## 2023-07-17 DIAGNOSIS — D649 Anemia, unspecified: Secondary | ICD-10-CM

## 2023-07-17 DIAGNOSIS — Z7289 Other problems related to lifestyle: Secondary | ICD-10-CM

## 2023-07-17 DIAGNOSIS — Z114 Encounter for screening for human immunodeficiency virus [HIV]: Secondary | ICD-10-CM

## 2023-07-17 DIAGNOSIS — R945 Abnormal results of liver function studies: Secondary | ICD-10-CM

## 2023-07-17 DIAGNOSIS — D61818 Other pancytopenia: Secondary | ICD-10-CM

## 2023-07-17 NOTE — Progress Notes (Unsigned)
Brad Terry [phone]   [phone]  CONSULT NOTE  REFERRING PHYSICIAN: Dr. Fabian Sharp   REASON FOR CONSULTATION:  Thrombocytopenia/pancytopenia   HPI Brad Terry is a 67 y.o. male with a past medical history significant for hemorrhoids, diverticulosis, arthritis, hyperlipidemia, and ***referred to the clinic for thrombocytopenia.  The patient was seen in the clinic 1 time in ~2012 by Dr. Arbutus Ped for thrombocytopenia.   Patient recently had routine blood work performed at his PCPs office on 06/18/2023.  He was being seen for the chief complaint of right sided neck pain for 2 months.  He had routine blood work performed that showed slightly low white blood cell count at 3.4, slightly low hemoglobin at 12.6, and thrombocytopenia with a platelet count of 91K.  His vitamin B12 was 374 on 05/29/2023 and his iron studies showed normal iron at 75, normal saturation 23.5, normal ferritin at 242, and normal TIBC at 319.2.  He is referred to the clinic for his thrombocytopenia.  Per chart review, the patient had a ultrasound of the abdomen performed on 07/09/2023 showing increased hepatic parenchymal echogenicity suggestive of steatosis.  Which was ordered by gastroenterology.  The patient was referred to the clinic for further evaluation and recommendations regarding this finding.    He is not sure how long she has had anemia. Per chart review, *** he had mild intermittent anemia since 2008. Typically, his Hbg is 12 or greater. It has not been lower than 11's. He denies any known bleeding or bruising including epistaxis, hemoptysis, hematemesis, melena, or hematochezia. The last colonoscopy was on 02/09/20 by Dr Lavon Paganini. This was normal except for some polyps. There are non-bleeding internal hemorrhoids that were small.   The patient denies ever needing an iron or blood transfusion.  Does known vitamin deficiencies.  Iron supplements, b12, or folate?  Overall he denies any fatigue, shortness of breath, or chest pain. LH.  She denies any NSAID use or blood thinners.  Denies any history of any stomach ulcers.  The patient denies frequent blood donation.  Denies recent pica.   Regarding the leukocytopenia, he is not sure how long he has had this but believes ***. The oldest records I have are from 2008. The patient tends to have slightly low or low end of normal WBC. The lowest I see is 2.5 from 2018.  Denies any recent or frequent infections except ***. Denies any other frequent or recent viral infections including hepatitis, mono, or HIV.  Denies any alcohol use. Denies any nausea, vomiting, diarrhea, or constipation.  Denies any fever, chills, night sweats, or unexplained weight loss.  Denies any herbal supplement use.  Denies any personal history of autoimmune disorders.   Regarding the thrombocytopenia, the patient denies any bleeding complications from surgery.  The oldest labs I have are from 2008 and the patient has had persistent thrombocytopenia since that time with a platelet count typically ranging around 100,000.  The lowest platelet count I see is 72K from 2009.  Denies any aspirin use.  Denies blood thinner use. Denies any immunosuppressive drugs.     HPI  Past Medical History:  Diagnosis Date   Allergy    CHEST PAIN UNSPECIFIED 01/04/2010   Qualifier: Diagnosis of  By: Shirlee Latch, MD, [name]     Diabetes [facility])    walked and lost weight and diabetes went away per patient   Diverticulosis    GERD (gastroesophageal reflux disease)    Headaches due to old head injury  Hyperlipidemia    Internal hemorrhoids    RECTAL BLEEDING 09/14/2010   Qualifier: Diagnosis of  By: Fabian Sharp MD, Neta Mends    Renal cyst    Rotator cuff (capsule) sprain 05/27/2013   Right     Sleep apnea    URTICARIA, ALLERGIC 12/30/2007   Qualifier: Diagnosis of  By: Clent Ridges MD, Tera Mater     Past Surgical History:  Procedure Laterality Date   COLONOSCOPY   12/27/2009   Brodie-hyperplastic polyp   INGUINAL HERNIA REPAIR Right 09/2022   KNEE ARTHROSCOPY     right   under the chin surgery       Family History  Problem Relation Age of Onset   Diabetes Mother    Diabetes Brother        x 2   Prostate cancer Brother        x 2   Colon cancer Neg Hx    Colon polyps Neg Hx    Esophageal cancer Neg Hx    Rectal cancer Neg Hx    Stomach cancer Neg Hx     Social History Social History   Tobacco Use   Smoking status: Never   Smokeless tobacco: Never  Vaping Use   Vaping status: Never Used  Substance Use Topics   Alcohol use: No    Alcohol/week: 0.0 standard drinks of alcohol   Drug use: No    Allergies  Allergen Reactions   Advil [Ibuprofen] Hives   Naproxen     Able to take aleve, tinnitus   Piroxicam     REACTION: tinnitus   Statins     Hx of se of 2 statins    Tylenol [Acetaminophen] Hives    Current Outpatient Medications  Medication Sig Dispense Refill   ibuprofen (ADVIL) 800 MG tablet Take 800 mg by mouth every 8 (eight) hours as needed.     omeprazole (PRILOSEC) 40 MG capsule Take 1 capsule (40 mg total) by mouth daily before breakfast. (Patient not taking: Reported on 06/18/2023) 90 capsule 3   tiZANidine (ZANAFLEX) 4 MG capsule Take 1 capsule (4 mg total) by mouth at bedtime as needed for muscle spasms (neck pain). 24 capsule 1   No current facility-administered medications for this visit.    REVIEW OF SYSTEMS:   Review of Systems  Constitutional: Negative for appetite change, chills, fatigue, fever and unexpected weight change.  HENT:   Negative for mouth sores, nosebleeds, sore throat and trouble swallowing.   Eyes: Negative for eye problems and icterus.  Respiratory: Negative for cough, hemoptysis, shortness of breath and wheezing.   Cardiovascular: Negative for chest pain and leg swelling.  Gastrointestinal: Negative for abdominal pain, constipation, diarrhea, nausea and vomiting.  Genitourinary:  Negative for bladder incontinence, difficulty urinating, dysuria, frequency and hematuria.   Musculoskeletal: Negative for back pain, gait problem, neck pain and neck stiffness.  Skin: Negative for itching and rash.  Neurological: Negative for dizziness, extremity weakness, gait problem, headaches, light-headedness and seizures.  Hematological: Negative for adenopathy. Does not bruise/bleed easily.  Psychiatric/Behavioral: Negative for confusion, depression and sleep disturbance. The patient is not nervous/anxious.     PHYSICAL EXAMINATION:  There were no vitals taken for this visit.  ECOG PERFORMANCE STATUS: {CHL ONC ECOG [id number]  Physical Exam  Constitutional: Oriented to person, place, and time and well-developed, well-nourished, and in no distress. No distress.  HENT:  Head: Normocephalic and atraumatic.  Mouth/Throat: Oropharynx is clear and moist. No oropharyngeal exudate.  Eyes: Conjunctivae are normal. Right  eye exhibits no discharge. Left eye exhibits no discharge. No scleral icterus.  Neck: Normal range of motion. Neck supple.  Cardiovascular: Normal rate, regular rhythm, normal heart sounds and intact distal pulses.   Pulmonary/Chest: Effort normal and breath sounds normal. No respiratory distress. No wheezes. No rales.  Abdominal: Soft. Bowel sounds are normal. Exhibits no distension and no mass. There is no tenderness.  Musculoskeletal: Normal range of motion. Exhibits no edema.  Lymphadenopathy:    No cervical adenopathy.  Neurological: Alert and oriented to person, place, and time. Exhibits normal muscle tone. Gait normal. Coordination normal.  Skin: Skin is warm and dry. No rash noted. Not diaphoretic. No erythema. No pallor.  Psychiatric: Mood, memory and judgment normal.  Vitals reviewed.  LABORATORY DATA: Lab Results  Component Value Date   WBC 3.4 (L) 06/18/2023   HGB 12.6 (L) 06/18/2023   HCT 38.0 (L) 06/18/2023   MCV 90.2 06/18/2023   PLT 91.0 (L)  06/18/2023      Chemistry      Component Value Date/Time   NA 140 05/28/2023 1224   NA 140 03/20/2019 1115   K 4.1 05/28/2023 1224   CL 105 05/28/2023 1224   CO2 30 05/28/2023 1224   BUN 18 05/28/2023 1224   BUN 12 03/20/2019 1115   CREATININE 1.07 05/28/2023 1224      Component Value Date/Time   CALCIUM 9.1 05/28/2023 1224   ALKPHOS 52 05/28/2023 1224   AST 19 05/28/2023 1224   ALT 18 05/28/2023 1224   BILITOT 0.5 05/28/2023 1224   BILITOT 0.3 03/20/2019 1115       RADIOGRAPHIC STUDIES: US Abdomen Complete  Result Date: 07/09/2023 CLINICAL DATA:  Abdominal pain EXAM: ABDOMEN ULTRASOUND COMPLETE COMPARISON:  Renal ultrasound 04/22/2013 FINDINGS: Gallbladder: No gallstones or wall thickening visualized. No sonographic Murphy sign noted by sonographer. Common bile duct: Diameter: 2.8 mm Liver: Mildly increased echogenicity. No focal lesion. Portal vein is patent on color Doppler imaging with normal direction of blood flow towards the liver. IVC: No abnormality visualized. Pancreas: Not well visualized. Spleen: Size and appearance within normal limits. Right Kidney: Length: 14.5 cm. Normal renal cortical thickness and echogenicity. There is a septated 4.9 cm cyst superior pole of the right kidney. Left Kidney: Length: 13.7 cm. Normal renal cortical thickness and echogenicity. There is a 4.7 cm simple cyst left kidney. No imaging follow-up needed. Abdominal aorta: No aneurysm visualized. Other findings: None. IMPRESSION: 1. No cholelithiasis or sonographic evidence for acute cholecystitis. 2. Mildly increased hepatic parenchymal echogenicity suggestive of steatosis. 3. Septated cyst superior pole right kidney. Recommend follow-up renal ultrasound in 6 months to ensure stability. Electronically Signed   By: Annia Belt M.D.   On: 07/09/2023 10:23   DG Cervical Spine Complete  Result Date: 06/26/2023 CLINICAL DATA:  months of neck pain right sided causing headache and arm pain no trauma  EXAM: CERVICAL SPINE - COMPLETE 4+ VIEW COMPARISON:  July eighteenth 2011 FINDINGS: The cervical spine is visualized from C1-C6. Straightening of the cervical lordosis without spondylolisthesis. Vertebral body heights are maintained: no evidence of acute fracture. Mild intervertebral disc space height loss at C5-6. Multilevel anterior osteophyte formation throughout the cervical spine. Prominent transverse processes at C7 bilaterally. Suboptimal oblique radiographs limit evaluation of the neuroforaminal spaces and are nondiagnostic on the LEFT. There is moderate osseous neuroforaminal narrowing of RIGHT C4-5 and 5-6 secondary to facet arthropathy and uncovertebral hypertrophy. No prevertebral soft tissue swelling. Visualized thorax is unremarkable. IMPRESSION: Multilevel degenerative changes of  the cervical spine with moderate osseous neuroforaminal narrowing of RIGHT C4-5 and C5-6. Electronically Signed   By: Meda Klinefelter M.D.   On: 06/26/2023 07:46    ASSESSMENT: This a very pleasant 67 year old ***male referred to the clinic for thrombocytopenia but also has pancytopenia with mildly low hemoglobin and white blood cell count.  The patient was seen with Dr. Arbutus Ped today.  The patient had several lab studies performed including a CBC, CMP, LDH, hepatitis panel, HIV, rheumatoid factor, ANA, iron, ferritin, vitamin B12, or folate.  The patient's labs from today demonstrate ***pancytopenia.  His total white blood cell count is slightly*** low at ***.   His her hemoglobin is slightly*** low at ***.  His platelet count is low at ***K.  The patient's CMP is fairly*** unremarkable, except for ***.  His iron studies are unremarkable ***.  His LDH is elevated***.  His other labs are still pending at this time   Dr. Arbutus Ped believes ***.    Dr. Arbutus Ped does not recommend any invasive work-up such as a bone marrow biopsy and aspirate at this time unless he has worsening pancytopenia***.   We will see her  back for follow-up visit in 3 months for evaluation and repeat CBC.   We will call the patient once I have all the results of his lab work today.  His hepatic steatosis could explain his thrombocytopenia.  The patient is already following with gastroenterology.  The patient voices understanding of current disease status and treatment options and is in agreement with the current care plan.  All questions were answered. The patient knows to call the clinic with any problems, questions or concerns. We can certainly see the patient much sooner if necessary.  Thank you so much for allowing me to participate in the care of Sheral Flow Terry. I will continue to follow up the patient with you and assist in his care.  I spent {CHL ONC TIME VISIT - ZOXWR:[id number]} counseling the patient face to face. The total time spent in the appointment was {CHL ONC TIME VISIT - BJYNW:[id number]}.  Disclaimer: This note was dictated with voice recognition software. Similar sounding words can inadvertently be transcribed and may not be corrected upon review.   Rheda Kassab L Chizara Mena July 17, 2023, 7:44 AM

## 2023-07-18 ENCOUNTER — Inpatient Hospital Stay: Payer: Medicare Other | Attending: Physician Assistant | Admitting: Physician Assistant

## 2023-07-18 ENCOUNTER — Inpatient Hospital Stay: Payer: Medicare Other

## 2023-07-18 VITALS — BP 113/72 | HR 63 | Temp 97.5°F | Resp 20 | Wt 209.5 lb

## 2023-07-18 DIAGNOSIS — D61818 Other pancytopenia: Secondary | ICD-10-CM | POA: Diagnosis present

## 2023-07-18 DIAGNOSIS — M169 Osteoarthritis of hip, unspecified: Secondary | ICD-10-CM | POA: Diagnosis not present

## 2023-07-18 DIAGNOSIS — M1909 Primary osteoarthritis, other specified site: Secondary | ICD-10-CM | POA: Insufficient documentation

## 2023-07-18 DIAGNOSIS — M179 Osteoarthritis of knee, unspecified: Secondary | ICD-10-CM | POA: Insufficient documentation

## 2023-07-18 DIAGNOSIS — M19071 Primary osteoarthritis, right ankle and foot: Secondary | ICD-10-CM | POA: Insufficient documentation

## 2023-07-18 DIAGNOSIS — D649 Anemia, unspecified: Secondary | ICD-10-CM

## 2023-07-18 DIAGNOSIS — D696 Thrombocytopenia, unspecified: Secondary | ICD-10-CM | POA: Insufficient documentation

## 2023-07-18 DIAGNOSIS — Z79899 Other long term (current) drug therapy: Secondary | ICD-10-CM | POA: Diagnosis not present

## 2023-07-18 DIAGNOSIS — Z8042 Family history of malignant neoplasm of prostate: Secondary | ICD-10-CM | POA: Insufficient documentation

## 2023-07-18 DIAGNOSIS — Z114 Encounter for screening for human immunodeficiency virus [HIV]: Secondary | ICD-10-CM

## 2023-07-18 DIAGNOSIS — R945 Abnormal results of liver function studies: Secondary | ICD-10-CM

## 2023-07-18 DIAGNOSIS — Z7289 Other problems related to lifestyle: Secondary | ICD-10-CM

## 2023-07-18 LAB — CBC WITH DIFFERENTIAL (CANCER CENTER ONLY)
Abs Immature Granulocytes: 0.01 10*3/uL (ref 0.00–0.07)
Basophils Absolute: 0 10*3/uL (ref 0.0–0.1)
Basophils Relative: 0 %
Eosinophils Absolute: 0.2 10*3/uL (ref 0.0–0.5)
Eosinophils Relative: 4 %
HCT: 37.3 % — ABNORMAL LOW (ref 39.0–52.0)
Hemoglobin: 12.7 g/dL — ABNORMAL LOW (ref 13.0–17.0)
Immature Granulocytes: 0 %
Lymphocytes Relative: 29 %
Lymphs Abs: 1 10*3/uL (ref 0.7–4.0)
MCH: 30.2 pg (ref 26.0–34.0)
MCHC: 34 g/dL (ref 30.0–36.0)
MCV: 88.8 fL (ref 80.0–100.0)
Monocytes Absolute: 0.2 10*3/uL (ref 0.1–1.0)
Monocytes Relative: 6 %
Neutro Abs: 2.1 10*3/uL (ref 1.7–7.7)
Neutrophils Relative %: 61 %
Platelet Count: 108 10*3/uL — ABNORMAL LOW (ref 150–400)
RBC: 4.2 MIL/uL — ABNORMAL LOW (ref 4.22–5.81)
RDW: 12.4 % (ref 11.5–15.5)
Smear Review: NORMAL
WBC Count: 3.5 10*3/uL — ABNORMAL LOW (ref 4.0–10.5)
nRBC: 0 % (ref 0.0–0.2)

## 2023-07-18 LAB — HEPATITIS PANEL, ACUTE
HCV Ab: NONREACTIVE
Hep A IgM: NONREACTIVE
Hep B C IgM: NONREACTIVE
Hepatitis B Surface Ag: NONREACTIVE

## 2023-07-18 LAB — CMP (CANCER CENTER ONLY)
ALT: 13 U/L (ref 0–44)
AST: 18 U/L (ref 15–41)
Albumin: 4.1 g/dL (ref 3.5–5.0)
Alkaline Phosphatase: 50 U/L (ref 38–126)
Anion gap: 5 (ref 5–15)
BUN: 15 mg/dL (ref 8–23)
CO2: 29 mmol/L (ref 22–32)
Calcium: 9.4 mg/dL (ref 8.9–10.3)
Chloride: 107 mmol/L (ref 98–111)
Creatinine: 0.99 mg/dL (ref 0.61–1.24)
GFR, Estimated: >60 mL/min (ref >60)
Glucose, Bld: 97 mg/dL (ref 70–99)
Potassium: 3.8 mmol/L (ref 3.5–5.1)
Sodium: 141 mmol/L (ref 135–145)
Total Bilirubin: 0.6 mg/dL (ref 0.3–1.2)
Total Protein: 6.4 g/dL — ABNORMAL LOW (ref 6.5–8.1)

## 2023-07-18 LAB — IRON AND IRON BINDING CAPACITY (CC-WL,HP ONLY)
Iron: 86 ug/dL (ref 45–182)
Saturation Ratios: 26 % (ref 17.9–39.5)
TIBC: 330 ug/dL (ref 250–450)
UIBC: 244 ug/dL (ref 117–376)

## 2023-07-18 LAB — FERRITIN: Ferritin: 220 ng/mL (ref 24–336)

## 2023-07-18 LAB — VITAMIN B12: Vitamin B-12: 356 pg/mL (ref 180–914)

## 2023-07-18 LAB — HIV ANTIBODY (ROUTINE TESTING W REFLEX): HIV Screen 4th Generation wRfx: NONREACTIVE

## 2023-07-18 LAB — FOLATE: Folate: 11.9 ng/mL (ref >5.9)

## 2023-07-20 LAB — RHEUMATOID FACTOR: Rheumatoid fact SerPl-aCnc: <10 [IU]/mL (ref <14.0)

## 2023-07-22 LAB — PROTEIN ELECTROPHORESIS, SERUM, WITH REFLEX
A/G Ratio: 1.6 (ref 0.7–1.7)
Albumin ELP: 3.7 g/dL (ref 2.9–4.4)
Alpha-1-Globulin: 0.2 g/dL (ref 0.0–0.4)
Alpha-2-Globulin: 0.4 g/dL (ref 0.4–1.0)
Beta Globulin: 0.9 g/dL (ref 0.7–1.3)
Gamma Globulin: 0.8 g/dL (ref 0.4–1.8)
Globulin, Total: 2.3 g/dL (ref 2.2–3.9)
Total Protein ELP: 6 g/dL (ref 6.0–8.5)

## 2023-07-23 LAB — ANTINUCLEAR ANTIBODIES, IFA: ANA Ab, IFA: NEGATIVE

## 2023-07-24 ENCOUNTER — Other Ambulatory Visit: Payer: Self-pay | Admitting: Neurosurgery

## 2023-07-24 ENCOUNTER — Encounter: Payer: Self-pay | Admitting: Neurosurgery

## 2023-07-24 DIAGNOSIS — M48061 Spinal stenosis, lumbar region without neurogenic claudication: Secondary | ICD-10-CM

## 2023-07-24 DIAGNOSIS — M542 Cervicalgia: Secondary | ICD-10-CM

## 2023-07-29 ENCOUNTER — Encounter: Payer: Self-pay | Admitting: Gastroenterology

## 2023-07-29 ENCOUNTER — Ambulatory Visit: Payer: Medicare Other | Admitting: Gastroenterology

## 2023-07-29 VITALS — BP 125/73 | HR 52 | Temp 98.2°F | Resp 15 | Ht 68.0 in | Wt 207.0 lb

## 2023-07-29 DIAGNOSIS — K219 Gastro-esophageal reflux disease without esophagitis: Secondary | ICD-10-CM | POA: Diagnosis not present

## 2023-07-29 DIAGNOSIS — Z09 Encounter for follow-up examination after completed treatment for conditions other than malignant neoplasm: Secondary | ICD-10-CM | POA: Diagnosis not present

## 2023-07-29 DIAGNOSIS — D696 Thrombocytopenia, unspecified: Secondary | ICD-10-CM | POA: Diagnosis not present

## 2023-07-29 DIAGNOSIS — Z86010 Personal history of colonic polyps: Secondary | ICD-10-CM | POA: Diagnosis not present

## 2023-07-29 DIAGNOSIS — D123 Benign neoplasm of transverse colon: Secondary | ICD-10-CM

## 2023-07-29 MED ORDER — SODIUM CHLORIDE 0.9 % IV SOLN: 500.0000 mL | INTRAVENOUS | Status: DC

## 2023-07-29 NOTE — Progress Notes (Unsigned)
Pt's states no medical or surgical changes since previsit or office visit. 

## 2023-07-29 NOTE — Op Note (Signed)
[facility] Patient Name: Brad Terry Procedure Date: 07/29/2023 1:49 PM MRN: [id number] Endoscopist: Napoleon Form , MD, [id number] Age: 67 Referring MD:  Date of Birth: 1955-12-13 Gender: Male Account #: [id number] Procedure:                Upper GI endoscopy Indications:              Exclusion of esophageal varices in patient with                            suspected portal hypertension/thrombocytopenia,                            Esophageal reflux Procedure:                Pre-Anesthesia Assessment:                           - Prior to the procedure, a History and Physical                            was performed, and patient medications and                            allergies were reviewed. The patient's tolerance of                            previous anesthesia was also reviewed. The risks                            and benefits of the procedure and the sedation                            options and risks were discussed with the patient.                            All questions were answered, and informed consent                            was obtained. Prior Anticoagulants: The patient has                            taken no anticoagulant or antiplatelet agents. ASA                            Grade Assessment: III - A patient with severe                            systemic disease. After reviewing the risks and                            benefits, the patient was deemed in satisfactory                            condition to undergo the procedure.  After obtaining informed consent, the endoscope was                            passed under direct vision. Throughout the                            procedure, the patient's blood pressure, pulse, and                            oxygen saturations were monitored continuously. The                            GIF [id number] #[id number] was introduced through the                            mouth,  and advanced to the second part of duodenum.                            The upper GI endoscopy was accomplished without                            difficulty. The patient tolerated the procedure                            well. Scope In: Scope Out: Findings:                 The Z-line was regular and was found 40 cm from the                            incisors.                           No gross lesions were noted in the entire esophagus.                           The stomach was normal.                           The cardia and gastric fundus were normal on                            retroflexion.                           The examined duodenum was normal. Complications:            No immediate complications. Estimated Blood Loss:     Estimated blood loss was minimal. Impression:               - Z-line regular, 40 cm from the incisors.                           - No gross lesions in the entire esophagus.                           -  Normal stomach.                           - Normal examined duodenum.                           - No specimens collected. Recommendation:           - Resume previous diet.                           - Continue present medications.                           - See the other procedure note for documentation of                            additional recommendations. Napoleon Form, MD 07/29/2023 2:29:34 PM This report has been signed electronically.

## 2023-07-29 NOTE — Op Note (Signed)
[facility] Patient Name: Brad Terry Procedure Date: 07/29/2023 1:45 PM MRN: [id number] Endoscopist: Napoleon Form , MD, [id number] Age: 67 Referring MD:  Date of Birth: 1956/07/19 Gender: Male Account #: [id number] Procedure:                Colonoscopy Indications:              High risk colon cancer surveillance: Personal                            history of colonic polyps, High risk colon cancer                            surveillance: Personal history of multiple (3 or                            more) adenomas Medicines:                Monitored Anesthesia Care Procedure:                Pre-Anesthesia Assessment:                           - Prior to the procedure, a History and Physical                            was performed, and patient medications and                            allergies were reviewed. The patient's tolerance of                            previous anesthesia was also reviewed. The risks                            and benefits of the procedure and the sedation                            options and risks were discussed with the patient.                            All questions were answered, and informed consent                            was obtained. Prior Anticoagulants: The patient has                            taken no anticoagulant or antiplatelet agents. ASA                            Grade Assessment: III - A patient with severe                            systemic disease. After reviewing the risks and  benefits, the patient was deemed in satisfactory                            condition to undergo the procedure.                           After obtaining informed consent, the colonoscope                            was passed under direct vision. Throughout the                            procedure, the patient's blood pressure, pulse, and                            oxygen saturations were monitored  continuously. The                            [id number]-HQ190L Colonoscope [id number] was introduced                            through the anus and advanced to the the cecum,                            identified by appendiceal orifice and ileocecal                            valve. The colonoscopy was performed without                            difficulty. The patient tolerated the procedure                            well. The quality of the bowel preparation was                            adequate. The ileocecal valve, appendiceal orifice,                            and rectum were photographed. Scope In: 2:01:14 PM Scope Out: 2:18:17 PM Scope Withdrawal Time: 0 hours 13 minutes 28 seconds  Total Procedure Duration: 0 hours 17 minutes 3 seconds  Findings:                 The perianal and digital rectal examinations were                            normal.                           A 3 mm polyp was found in the transverse colon. The                            polyp was sessile. The polyp was removed with a  cold snare. Resection and retrieval were complete.                           Scattered large-mouthed, medium-mouthed and                            small-mouthed diverticula were found in the sigmoid                            colon, descending colon, transverse colon and                            ascending colon.                           Non-bleeding external and internal hemorrhoids were                            found during retroflexion. The hemorrhoids were                            medium-sized. Complications:            No immediate complications. Estimated Blood Loss:     Estimated blood loss was minimal. Impression:               - One 3 mm polyp in the transverse colon, removed                            with a cold snare. Resected and retrieved.                           - Moderate diverticulosis in the sigmoid colon, in                             the descending colon, in the transverse colon and                            in the ascending colon.                           - Non-bleeding external and internal hemorrhoids. Recommendation:           - Patient has a contact number available for                            emergencies. The signs and symptoms of potential                            delayed complications were discussed with the                            patient. Return to normal activities tomorrow.                            Written discharge instructions were provided to the  patient.                           - Resume previous diet.                           - Continue present medications.                           - Await pathology results.                           - Repeat colonoscopy in 5 years for surveillance. Napoleon Form, MD 07/29/2023 2:25:12 PM This report has been signed electronically.

## 2023-07-29 NOTE — Progress Notes (Unsigned)
Vss nad trans to pacu 

## 2023-07-29 NOTE — Progress Notes (Unsigned)
[facility] History and Physical   Primary Care Physician:  Madelin Headings, MD   Reason for Procedure:  H/o colon polyps, GERD, thrombocytopenia, exclude portal HTN or esophageal varices  Plan:    EGD and colonoscopy with possible interventions as needed     HPI: Brad Terry is a very pleasant 67 y.o. male here for EGD and colonoscopy for H/o colon polyps, GERD, thrombocytopenia, exclude portal HTN or esophageal varices.   The risks and benefits as well as alternatives of endoscopic procedure(s) have been discussed and reviewed. All questions answered. The patient agrees to proceed.    Past Medical History:  Diagnosis Date   Allergy    CHEST PAIN UNSPECIFIED 01/04/2010   Qualifier: Diagnosis of  By: Shirlee Latch, MD, [name]     Diabetes [facility])    walked and lost weight and diabetes went away per patient   Diverticulosis    GERD (gastroesophageal reflux disease)    Headaches due to old head injury    Hyperlipidemia    Internal hemorrhoids    RECTAL BLEEDING 09/14/2010   Qualifier: Diagnosis of  By: Fabian Sharp MD, Neta Mends    Renal cyst    Rotator cuff (capsule) sprain 05/27/2013   Right     Sleep apnea    URTICARIA, ALLERGIC 12/30/2007   Qualifier: Diagnosis of  By: Clent Ridges MD, Tera Mater     Past Surgical History:  Procedure Laterality Date   COLONOSCOPY  12/27/2009   Brodie-hyperplastic polyp   INGUINAL HERNIA REPAIR Right 09/2022   KNEE ARTHROSCOPY     right   under the chin surgery       Prior to Admission medications   Medication Sig Start Date End Date Taking? Authorizing Provider  ibuprofen (ADVIL) 800 MG tablet Take 800 mg by mouth every 8 (eight) hours as needed.    [provider]  omeprazole (PRILOSEC) 40 MG capsule Take 1 capsule (40 mg total) by mouth daily before breakfast. Patient not taking: Reported on 06/18/2023 05/28/23   Doree Albee, PA-C  tiZANidine (ZANAFLEX) 4 MG capsule Take 1 capsule (4 mg total) by mouth at  bedtime as needed for muscle spasms (neck pain). Patient not taking: Reported on 07/29/2023 06/18/23   Madelin Headings, MD    Current Outpatient Medications  Medication Sig Dispense Refill   ibuprofen (ADVIL) 800 MG tablet Take 800 mg by mouth every 8 (eight) hours as needed.     omeprazole (PRILOSEC) 40 MG capsule Take 1 capsule (40 mg total) by mouth daily before breakfast. (Patient not taking: Reported on 06/18/2023) 90 capsule 3   tiZANidine (ZANAFLEX) 4 MG capsule Take 1 capsule (4 mg total) by mouth at bedtime as needed for muscle spasms (neck pain). (Patient not taking: Reported on 07/29/2023) 24 capsule 1   Current Facility-Administered Medications  Medication Dose Route Frequency Provider Last Rate Last Admin   0.9 %  sodium chloride infusion  500 mL Intravenous Continuous Napoleon Form, MD        Allergies as of 07/29/2023 - Review Complete 07/29/2023  Allergen Reaction Noted   Advil [ibuprofen] Hives 01/01/2023   Naproxen  09/11/2007   Piroxicam  09/11/2007   Statins  08/04/2012   Tylenol [acetaminophen] Hives 01/01/2023    Family History  Problem Relation Age of Onset   Diabetes Mother    Diabetes Brother        x 2   Prostate cancer Brother        x 2  Colon cancer Neg Hx    Colon polyps Neg Hx    Esophageal cancer Neg Hx    Rectal cancer Neg Hx    Stomach cancer Neg Hx     Social History   Socioeconomic History   Marital status: Married    Spouse name: [name]   Number of children: 4   Years of education: Not on file   Highest education level: Not on file  Occupational History   Occupation: Education officer, environmental  Tobacco Use   Smoking status: Never   Smokeless tobacco: Never  Vaping Use   Vaping status: Never Used  Substance and Sexual Activity   Alcohol use: No    Alcohol/week: 0.0 standard drinks of alcohol   Drug use: No   Sexual activity: Not on file  Other Topics Concern   Not on file  Social History Narrative   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   Tries to walk with exercise      [place] country of origin      Caffeine- seldom    Social Determinants of Corporate investment banker Strain: Not on file  Food Insecurity: Not on file  Transportation Needs: Not on file  Physical Activity: Not on file  Stress: Not on file  Social Connections: Not on file  Intimate Partner Violence: Not on file    Review of Systems:  All other review of systems negative except as mentioned in the HPI.  Physical Exam: Vital signs in last 24 hours: BP 114/62   Pulse (!) 59   Temp 98.2 F (36.8 C) (Temporal)   Resp 12   Ht 5\' 8"  (1.727 m)   Wt 207 lb (93.9 kg)   SpO2 97%   BMI 31.47 kg/m  General:   Alert, NAD Lungs:  Clear .   Heart:  Regular rate and rhythm Abdomen:  Soft, nontender and nondistended. Neuro/Psych:  Alert and cooperative. Normal mood and affect. A and O x 3  Reviewed labs, radiology imaging, old records and pertinent past GI work up  Patient is appropriate for planned procedure(s) and anesthesia in an ambulatory setting   K. Scherry Ran , MD [id number]

## 2023-07-29 NOTE — Patient Instructions (Signed)
Handouts Provided:  Diverticulosis and Polyps  YOU HAD AN ENDOSCOPIC PROCEDURE TODAY AT THE [facility]:   Refer to the procedure report that was given to you for any specific questions about what was found during the examination.  If the procedure report does not answer your questions, please call your gastroenterologist to clarify.  If you requested that your care partner not be given the details of your procedure findings, then the procedure report has been included in a sealed envelope for you to review at your convenience later.  YOU SHOULD EXPECT: Some feelings of bloating in the abdomen. Passage of more gas than usual.  Walking can help get rid of the air that was put into your GI tract during the procedure and reduce the bloating. If you had a lower endoscopy (such as a colonoscopy or flexible sigmoidoscopy) you may notice spotting of blood in your stool or on the toilet paper. If you underwent a bowel prep for your procedure, you may not have a normal bowel movement for a few days.  Please Note:  You might notice some irritation and congestion in your nose or some drainage.  This is from the oxygen used during your procedure.  There is no need for concern and it should clear up in a day or so.  SYMPTOMS TO REPORT IMMEDIATELY:  Following lower endoscopy (colonoscopy or flexible sigmoidoscopy):  Excessive amounts of blood in the stool  Significant tenderness or worsening of abdominal pains  Swelling of the abdomen that is new, acute  Fever of 100F or higher  Following upper endoscopy (EGD)  Vomiting of blood or coffee ground material  New chest pain or pain under the shoulder blades  Painful or persistently difficult swallowing  New shortness of breath  Fever of 100F or higher  Black, tarry-looking stools  For urgent or emergent issues, a gastroenterologist can be reached at any hour by calling [phone]. Do not use MyChart messaging for urgent concerns.     DIET:  We do recommend a small meal at first, but then you may proceed to your regular diet.  Drink plenty of fluids but you should avoid alcoholic beverages for 24 hours.  ACTIVITY:  You should plan to take it easy for the rest of today and you should NOT DRIVE or use heavy machinery until tomorrow (because of the sedation medicines used during the test).    FOLLOW UP: Our staff will call the number listed on your records the next business day following your procedure.  We will call around 7:15- 8:00 am to check on you and address any questions or concerns that you may have regarding the information given to you following your procedure. If we do not reach you, we will leave a message.     If any biopsies were taken you will be contacted by phone or by letter within the next 1-3 weeks.  Please call us at [phone] if you have not heard about the biopsies in 3 weeks.    SIGNATURES/CONFIDENTIALITY: You and/or your care partner have signed paperwork which will be entered into your electronic medical record.  These signatures attest to the fact that that the information above on your After Visit Summary has been reviewed and is understood.  Full responsibility of the confidentiality of this discharge information lies with you and/or your care-partner.

## 2023-07-29 NOTE — Progress Notes (Unsigned)
Called to room to assist during endoscopic procedure.  Patient ID and intended procedure confirmed with present staff. Received instructions for my participation in the procedure from the performing physician.  

## 2023-07-29 NOTE — Progress Notes (Unsigned)
After removing blood pressure cuff prior to discharge patient complained of an aching/sore pain in left elbow area.  Patient stated more like joint pain he thinks.  Pain was 8 our of 10.  Dr. Lavon Paganini is aware and will come assess prior to discharge.

## 2023-07-30 ENCOUNTER — Telehealth: Payer: Self-pay

## 2023-07-30 NOTE — Telephone Encounter (Signed)
Follow up call to pt, lm for pt to call if having any difficulty with normal activities or eating and drinking.  Also to call if any other questions or concerns.  

## 2023-08-01 LAB — PSA: PSA: 0.74

## 2023-08-01 LAB — SURGICAL PATHOLOGY

## 2023-08-02 ENCOUNTER — Encounter: Payer: Self-pay | Admitting: Gastroenterology

## 2023-08-12 ENCOUNTER — Other Ambulatory Visit: Payer: Self-pay | Admitting: Radiology

## 2023-08-12 DIAGNOSIS — D61818 Other pancytopenia: Secondary | ICD-10-CM

## 2023-08-13 ENCOUNTER — Ambulatory Visit (HOSPITAL_COMMUNITY)
Admission: RE | Admit: 2023-08-13 | Discharge: 2023-08-13 | Disposition: A | Discharge status: Home / Self Care | Payer: Medicare Other | Source: Ambulatory Visit | Attending: Physician Assistant | Admitting: Physician Assistant

## 2023-08-13 ENCOUNTER — Ambulatory Visit (HOSPITAL_COMMUNITY): Payer: Medicare Other

## 2023-08-19 ENCOUNTER — Other Ambulatory Visit: Payer: Medicare Other

## 2023-08-26 ENCOUNTER — Inpatient Hospital Stay: Payer: Medicare Other | Attending: Physician Assistant

## 2023-08-26 ENCOUNTER — Inpatient Hospital Stay (HOSPITAL_BASED_OUTPATIENT_CLINIC_OR_DEPARTMENT_OTHER): Payer: Medicare Other | Admitting: Internal Medicine

## 2023-08-26 ENCOUNTER — Other Ambulatory Visit: Payer: Medicare Other

## 2023-08-26 ENCOUNTER — Ambulatory Visit
Admission: RE | Admit: 2023-08-26 | Discharge: 2023-08-26 | Disposition: A | Discharge status: Home / Self Care | Payer: Medicare Other | Source: Ambulatory Visit | Attending: Neurosurgery | Admitting: Neurosurgery

## 2023-08-26 VITALS — BP 120/66 | HR 55 | Temp 97.7°F | Resp 17 | Ht 68.0 in | Wt 208.3 lb

## 2023-08-26 DIAGNOSIS — M542 Cervicalgia: Secondary | ICD-10-CM | POA: Insufficient documentation

## 2023-08-26 DIAGNOSIS — Z79899 Other long term (current) drug therapy: Secondary | ICD-10-CM | POA: Insufficient documentation

## 2023-08-26 DIAGNOSIS — D61818 Other pancytopenia: Secondary | ICD-10-CM | POA: Insufficient documentation

## 2023-08-26 DIAGNOSIS — M48061 Spinal stenosis, lumbar region without neurogenic claudication: Secondary | ICD-10-CM

## 2023-08-26 DIAGNOSIS — D72819 Decreased white blood cell count, unspecified: Secondary | ICD-10-CM | POA: Diagnosis not present

## 2023-08-26 LAB — CBC WITH DIFFERENTIAL (CANCER CENTER ONLY)
Abs Immature Granulocytes: 0.01 10*3/uL (ref 0.00–0.07)
Basophils Absolute: 0 10*3/uL (ref 0.0–0.1)
Basophils Relative: 1 %
Eosinophils Absolute: 0.2 10*3/uL (ref 0.0–0.5)
Eosinophils Relative: 6 %
HCT: 38.3 % — ABNORMAL LOW (ref 39.0–52.0)
Hemoglobin: 12.9 g/dL — ABNORMAL LOW (ref 13.0–17.0)
Immature Granulocytes: 0 %
Lymphocytes Relative: 34 %
Lymphs Abs: 1.2 10*3/uL (ref 0.7–4.0)
MCH: 30.1 pg (ref 26.0–34.0)
MCHC: 33.7 g/dL (ref 30.0–36.0)
MCV: 89.3 fL (ref 80.0–100.0)
Monocytes Absolute: 0.3 10*3/uL (ref 0.1–1.0)
Monocytes Relative: 9 %
Neutro Abs: 1.7 10*3/uL (ref 1.7–7.7)
Neutrophils Relative %: 50 %
Platelet Count: 92 10*3/uL — ABNORMAL LOW (ref 150–400)
RBC: 4.29 MIL/uL (ref 4.22–5.81)
RDW: 12.5 % (ref 11.5–15.5)
WBC Count: 3.4 10*3/uL — ABNORMAL LOW (ref 4.0–10.5)
nRBC: 0 % (ref 0.0–0.2)

## 2023-08-26 NOTE — Progress Notes (Signed)
[facility] [phone]   [phone]  OFFICE PROGRESS NOTE  Panosh, Neta Mends, MD [address] 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 male returns to the clinic today for follow-up visit accompanied by his daughter.Discussed the use of AI scribe software for clinical note transcription with the patient, who gave verbal consent to proceed.  History of Present Illness   The 67 year old patient was seen approximately a month ago for pancytopenia, presenting with lower white blood cells, red blood cells, and platelets. Extensive testing was conducted, including rheumatoid factor, hepatitis panel, HIV, Vitamin B12, and iron studies, all of which returned negative or normal results. No clear cause for the condition was identified.  A bone marrow biopsy and aspirate were discussed and scheduled, but due to scheduling issues, the procedure was not completed.  The patient's blood count remains similar to previous results, with a total white blood count of 3.4, hemoglobin of 12.9, hematocrit of 38.3, and platelet count of 92,000. This is a decrease from the previous month but similar to the count in August.  In addition to the pancytopenia, the patient is also scheduled for an MRI of the lumbar and cervical spine due to reported unusual pain that starts in the lumbar area and rises to the neck, causing headaches. This MRI was ordered by both the patient's family doctor and a neurosurgeon.       MEDICAL HISTORY: Past Medical History:  Diagnosis Date   Allergy    CHEST PAIN UNSPECIFIED 01/04/2010   Qualifier: Diagnosis of  By: Shirlee Latch, MD, [name]     Diabetes [facility])    walked and lost weight and diabetes went away per patient   Diverticulosis    GERD (gastroesophageal reflux disease)    Headaches due  to old head injury    Hyperlipidemia    Internal hemorrhoids    RECTAL BLEEDING 09/14/2010   Qualifier: Diagnosis of  By: Fabian Sharp MD, Neta Mends    Renal cyst    Rotator cuff (capsule) sprain 05/27/2013   Right     Sleep apnea    URTICARIA, ALLERGIC 12/30/2007   Qualifier: Diagnosis of  By: Clent Ridges MD, Tera Mater     ALLERGIES:  is allergic to advil [ibuprofen], naproxen, piroxicam, statins, and tylenol [acetaminophen].  MEDICATIONS:  Current Outpatient Medications  Medication Sig Dispense Refill   ibuprofen (ADVIL) 800 MG tablet Take 800 mg by mouth every 8 (eight) hours as needed.     omeprazole (PRILOSEC) 40 MG capsule Take 1 capsule (40 mg total) by mouth daily before breakfast. (Patient not taking: Reported on 06/18/2023) 90 capsule 3   tiZANidine (ZANAFLEX) 4 MG capsule Take 1 capsule (4 mg total) by mouth at bedtime as needed for muscle spasms (neck pain). (Patient not taking: Reported on 07/29/2023) 24 capsule 1   No current facility-administered medications for this visit.    SURGICAL HISTORY:  Past Surgical History:  Procedure Laterality Date   COLONOSCOPY  12/27/2009   Brodie-hyperplastic polyp   INGUINAL HERNIA REPAIR Right 09/2022   KNEE ARTHROSCOPY     right   under the chin surgery       REVIEW OF SYSTEMS:  A comprehensive review of systems was negative except for: Constitutional: positive for fatigue Musculoskeletal: positive for back pain   PHYSICAL EXAMINATION: General appearance: alert, cooperative, fatigued,  and no distress Head: Normocephalic, without obvious abnormality, atraumatic Neck: no adenopathy, no JVD, supple, symmetrical, trachea midline, and thyroid not enlarged, symmetric, no tenderness/mass/nodules Lymph nodes: Cervical, supraclavicular, and axillary nodes normal. Resp: clear to auscultation bilaterally Back: symmetric, no curvature. ROM normal. No CVA tenderness. Cardio: regular rate and rhythm, S1, S2 normal, no murmur, click, rub or gallop GI:  soft, non-tender; bowel sounds normal; no masses,  no organomegaly Extremities: extremities normal, atraumatic, no cyanosis or edema  ECOG PERFORMANCE STATUS: 1 - Symptomatic but completely ambulatory  Blood pressure 120/66, pulse (!) 55, temperature 97.7 F (36.5 C), temperature source Oral, resp. rate 17, height 5\' 8"  (1.727 m), weight 208 lb 4.8 oz (94.5 kg), SpO2 100%.  LABORATORY DATA: Lab Results  Component Value Date   WBC 3.4 (L) 08/26/2023   HGB 12.9 (L) 08/26/2023   HCT 38.3 (L) 08/26/2023   MCV 89.3 08/26/2023   PLT 92 (L) 08/26/2023      Chemistry      Component Value Date/Time   NA 141 07/18/2023 1326   NA 140 03/20/2019 1115   K 3.8 07/18/2023 1326   CL 107 07/18/2023 1326   CO2 29 07/18/2023 1326   BUN 15 07/18/2023 1326   BUN 12 03/20/2019 1115   CREATININE 0.99 07/18/2023 1326      Component Value Date/Time   CALCIUM 9.4 07/18/2023 1326   ALKPHOS 50 07/18/2023 1326   AST 18 07/18/2023 1326   ALT 13 07/18/2023 1326   BILITOT 0.6 07/18/2023 1326       RADIOGRAPHIC STUDIES: No results found.  ASSESSMENT AND PLAN: This is a very pleasant 67 years old African-American male with pancytopenia of unclear etiology.  He had extensive blood work [facility] recently that was unremarkable.    Pancytopenia Persistent low white blood cells, red blood cells, and platelets. No clear cause identified from extensive workup (negative rheumatoid factor, ANA, hepatitis panel, HIV, normal vitamin B12, and iron studies). Concern for possible myelodysplastic syndrome. -Schedule bone marrow biopsy and aspirate on 09/05/2023 to rule out myelodysplastic syndrome. -Follow-up appointment 10-14 days post-biopsy to discuss results.  Spine Pain Pain originating in the lumbar area and radiating to the neck, causing headaches. -MRI of lumbar and cervical spine scheduled for 08/26/2023 as ordered by family doctor and neurosurgeon. -Follow-up with neurosurgeon as needed based on MRI  results.   He was advised to call if he has any concerning symptoms in the interval. The patient voices understanding of current disease status and treatment options and is in agreement with the current care plan.  All questions were answered. The patient knows to call the clinic with any problems, questions or concerns. We can certainly see the patient much sooner if necessary.  The total time spent in the appointment was 20 minutes.  Disclaimer: This note was dictated with voice recognition software. Similar sounding words can inadvertently be transcribed and may not be corrected upon review.

## 2023-09-04 ENCOUNTER — Other Ambulatory Visit: Payer: Self-pay

## 2023-09-04 NOTE — H&P (Signed)
Chief Complaint: Patient was seen in consultation today for bone marrow biopsy with aspiration  Referring Physician(s): Heilingoetter,Cassandra L  Supervising Physician: Simonne Come  Patient Status: [facility] - Out-pt  History of Present Illness: Brad Terry is a 67 y.o. male with a medical history significant for diverticulosis, GERD and ITP.  He was referred to Hematology/ Oncology for evaluation of thrombocytopenia after routine lab work with his PCP showed a platelet count of 91K and slightly low hemoglobin at 12.6. The patient is from [place] but has been in the [place] for over 40 years. Records obtained by Heme/Onc showed the patient has had thrombocytopenia dating back to 2008. Current lab work up showed findings consistent with prior values but a bone marrow biopsy with aspiration has been requested to rule out bone marrow pathology.   Past Medical History:  Diagnosis Date   Allergy    CHEST PAIN UNSPECIFIED 01/04/2010   Qualifier: Diagnosis of  By: Shirlee Latch, MD, [name]     Diabetes [facility])    walked and lost weight and diabetes went away per patient   Diverticulosis    GERD (gastroesophageal reflux disease)    Headaches due to old head injury    Hyperlipidemia    Internal hemorrhoids    RECTAL BLEEDING 09/14/2010   Qualifier: Diagnosis of  By: Fabian Sharp MD, Neta Mends    Renal cyst    Rotator cuff (capsule) sprain 05/27/2013   Right     Sleep apnea    URTICARIA, ALLERGIC 12/30/2007   Qualifier: Diagnosis of  By: Clent Ridges MD, Tera Mater     Past Surgical History:  Procedure Laterality Date   COLONOSCOPY  12/27/2009   Brodie-hyperplastic polyp   INGUINAL HERNIA REPAIR Right 09/2022   KNEE ARTHROSCOPY     right   under the chin surgery       Allergies: Advil [ibuprofen], Naproxen, Piroxicam, Statins, and Tylenol [acetaminophen]  Medications: Prior to Admission medications   Medication Sig Start Date End Date Taking? Authorizing Provider   ibuprofen (ADVIL) 800 MG tablet Take 800 mg by mouth every 8 (eight) hours as needed.    [provider]  omeprazole (PRILOSEC) 40 MG capsule Take 1 capsule (40 mg total) by mouth daily before breakfast. Patient not taking: Reported on 06/18/2023 05/28/23   Doree Albee, PA-C  tiZANidine (ZANAFLEX) 4 MG capsule Take 1 capsule (4 mg total) by mouth at bedtime as needed for muscle spasms (neck pain). Patient not taking: Reported on 07/29/2023 06/18/23   Panosh, Neta Mends, MD     Family History  Problem Relation Age of Onset   Diabetes Mother    Diabetes Brother        x 2   Prostate cancer Brother        x 2   Colon cancer Neg Hx    Colon polyps Neg Hx    Esophageal cancer Neg Hx    Rectal cancer Neg Hx    Stomach cancer Neg Hx     Social History   Socioeconomic History   Marital status: Married    Spouse name: [name]   Number of children: 4   Years of education: Not on file   Highest education level: Not on file  Occupational History   Occupation: Education officer, environmental  Tobacco Use   Smoking status: Never   Smokeless tobacco: Never  Vaping Use   Vaping status: Never Used  Substance and Sexual Activity   Alcohol use: No    Alcohol/week:  0.0 standard drinks of alcohol   Drug use: No   Sexual activity: Not on file  Other Topics Concern   Not on file  Social History Narrative   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   Tries to walk with exercise      [place] country of origin      Caffeine- seldom    Social Determinants of Corporate investment banker Strain: Not on file  Food Insecurity: Not on file  Transportation Needs: Not on file  Physical Activity: Not on file  Stress: Not on file  Social Connections: Not on file    Review of Systems: A 12 point ROS discussed and pertinent positives are indicated in the HPI above.  All other systems are negative.  Review of Systems  Constitutional:  Negative for appetite change and fatigue.   Respiratory:  Negative for cough and shortness of breath.   Cardiovascular:  Negative for chest pain and leg swelling.  Gastrointestinal:  Negative for abdominal pain, diarrhea, nausea and vomiting.  Genitourinary:  Negative for flank pain.  Musculoskeletal:  Negative for back pain.  Neurological:  Negative for dizziness and headaches.    Vital Signs: There were no vitals taken for this visit.  Physical Exam Constitutional:      General: He is not in acute distress.    Appearance: He is not ill-appearing.  HENT:     Mouth/Throat:     Mouth: Mucous membranes are moist.     Pharynx: Oropharynx is clear.  Cardiovascular:     Rate and Rhythm: Normal rate and regular rhythm.     Pulses: Normal pulses.     Heart sounds: Normal heart sounds.  Pulmonary:     Effort: Pulmonary effort is normal.     Breath sounds: Normal breath sounds.  Abdominal:     General: Bowel sounds are normal.     Palpations: Abdomen is soft.     Tenderness: There is no abdominal tenderness.  Musculoskeletal:     Right lower leg: No edema.     Left lower leg: No edema.  Skin:    General: Skin is warm and dry.  Neurological:     Mental Status: He is alert and oriented to person, place, and time.  Psychiatric:        Mood and Affect: Mood normal.        Behavior: Behavior normal.        Thought Content: Thought content normal.        Judgment: Judgment normal.     Imaging: No results found.  Labs:  CBC: Recent Labs    05/28/23 1224 06/18/23 1307 07/18/23 1326 08/26/23 0723  WBC 3.2* 3.4* 3.5* 3.4*  HGB 12.4* 12.6* 12.7* 12.9*  HCT 37.6* 38.0* 37.3* 38.3*  PLT 79.0* 91.0* 108* 92*    COAGS: No results for input(s): "INR", "APTT" in the last 8760 hours.  BMP: Recent Labs    02/14/23 1125 05/28/23 1224 07/18/23 1326  NA 141 140 141  K 4.3 4.1 3.8  CL 105 105 107  CO2 32 30 29  GLUCOSE 78 82 97  BUN 15 18 15   CALCIUM 9.3 9.1 9.4  CREATININE 0.96 1.07 0.99  GFRNONAA  --   --   >60    LIVER FUNCTION TESTS: Recent Labs    02/14/23 1125 05/28/23 1224 07/18/23 1326  BILITOT 0.5 0.5 0.6  AST 20 19 18   ALT 31 18 13  ALKPHOS 56 52 50  PROT 6.0 6.5 6.4*  ALBUMIN 4.1 4.1 4.1    TUMOR MARKERS: No results for input(s): "AFPTM", "CEA", "CA199", "CHROMGRNA" in the last 8760 hours.  Assessment and Plan:  Chronic thrombocytopenia of uncertain etiology: Brad Terry, 67 year old male, presents today to the [facility] Interventional Radiology department for an image-guided bone marrow biopsy with aspiration.  Risks and benefits of this procedure were discussed with the patient and/or patient's family including, but not limited to bleeding, infection, damage to adjacent structures or low yield requiring additional tests.  All of the questions were answered and there is agreement to proceed. He has been NPO. He is a full code.   Consent signed and in chart.  Thank you for this interesting consult.  I greatly enjoyed meeting Brad Terry and look forward to participating in their care.  A copy of this report was sent to the requesting provider on this date.  Electronically Signed: Alwyn Ren, AGACNP-BC [phone] 09/04/2023, 3:49 PM   I spent a total of  30 Minutes   in face to face in clinical consultation, greater than 50% of which was counseling/coordinating care for bone marrow biopsy with aspiration.

## 2023-09-05 ENCOUNTER — Encounter (HOSPITAL_COMMUNITY): Payer: Self-pay

## 2023-09-05 ENCOUNTER — Other Ambulatory Visit: Payer: Self-pay

## 2023-09-05 ENCOUNTER — Ambulatory Visit (HOSPITAL_COMMUNITY)
Admission: RE | Admit: 2023-09-05 | Discharge: 2023-09-05 | Disposition: A | Discharge status: Home / Self Care | Payer: Medicare Other | Source: Ambulatory Visit | Attending: Physician Assistant | Admitting: Physician Assistant

## 2023-09-05 DIAGNOSIS — D61818 Other pancytopenia: Secondary | ICD-10-CM | POA: Diagnosis present

## 2023-09-05 LAB — CBC WITH DIFFERENTIAL/PLATELET
Abs Immature Granulocytes: 0.01 10*3/uL (ref 0.00–0.07)
Basophils Absolute: 0 10*3/uL (ref 0.0–0.1)
Basophils Relative: 0 %
Eosinophils Absolute: 0.2 10*3/uL (ref 0.0–0.5)
Eosinophils Relative: 5 %
HCT: 37.2 % — ABNORMAL LOW (ref 39.0–52.0)
Hemoglobin: 12.1 g/dL — ABNORMAL LOW (ref 13.0–17.0)
Immature Granulocytes: 0 %
Lymphocytes Relative: 29 %
Lymphs Abs: 1.2 10*3/uL (ref 0.7–4.0)
MCH: 29.8 pg (ref 26.0–34.0)
MCHC: 32.5 g/dL (ref 30.0–36.0)
MCV: 91.6 fL (ref 80.0–100.0)
Monocytes Absolute: 0.4 10*3/uL (ref 0.1–1.0)
Monocytes Relative: 10 %
Neutro Abs: 2.3 10*3/uL (ref 1.7–7.7)
Neutrophils Relative %: 56 %
Platelets: 92 10*3/uL — ABNORMAL LOW (ref 150–400)
RBC: 4.06 MIL/uL — ABNORMAL LOW (ref 4.22–5.81)
RDW: 12.8 % (ref 11.5–15.5)
WBC: 4 10*3/uL (ref 4.0–10.5)
nRBC: 0 % (ref 0.0–0.2)

## 2023-09-05 LAB — GLUCOSE, CAPILLARY: Glucose-Capillary: 104 mg/dL — ABNORMAL HIGH (ref 70–99)

## 2023-09-05 MED ORDER — SODIUM CHLORIDE 0.9 % IV SOLN: INTRAVENOUS | Status: DC

## 2023-09-05 MED ORDER — MIDAZOLAM HCL 2 MG/2ML IJ SOLN
INTRAMUSCULAR | Status: AC
  Filled 2023-09-05: qty 4

## 2023-09-05 MED ORDER — FENTANYL CITRATE (PF) 100 MCG/2ML IJ SOLN
INTRAMUSCULAR | Status: AC | PRN
  Administered 2023-09-05 (×2): 50 ug via INTRAVENOUS

## 2023-09-05 MED ORDER — FENTANYL CITRATE (PF) 100 MCG/2ML IJ SOLN
INTRAMUSCULAR | Status: AC
  Filled 2023-09-05: qty 2

## 2023-09-05 MED ORDER — MIDAZOLAM HCL 2 MG/2ML IJ SOLN
INTRAMUSCULAR | Status: AC | PRN
  Administered 2023-09-05 (×2): 1 mg via INTRAVENOUS

## 2023-09-05 NOTE — Discharge Instructions (Signed)
Please call Interventional Radiology clinic [phone] with any questions or concerns.  You may remove your dressing and shower tomorrow.  After the procedure, it is common to have: Soreness Bruising Mild pain  Follow these instructions at home:  Medication: Do not use Aspirin or ibuprofen products, such as Advil or Motrin, as it may increase bleeding You may resume your usual medications as ordered by your doctor If your doctor prescribed antibiotics, take them as directed. Do not stop taking them just because you feel better. You need to take the full course of antibiotics  Eating and drinking: Drink plenty of liquids to keep your urine pale yellow You can resume your regular diet as directed by your doctor   Care of the procedure site  Check your biopsy site every day until it heals  Keep the bandage dry. You can take the bandage off and shower tomorrow  If you are bleeding from the biopsy site, apply firm pressure on the area for at least 30 minutes  If directed, apply ice to your biopsy site 2-3 times a day.  o Put ice in a plastic bag  o Place a towel between your skin and the ice bag  o Leave the ice in place for 20 minutes 2-3 times a day  Activity Do not take baths, swim, or use a hot tub until your health care provider approves  Keep all follow-up visits as told by your doctor  Contact your doctor or seek immediate medical care if: You have bright red bleeding from the biopsy site that does not stop after 30 minutes of holding direct pressure on the site. You have signs of infection, such as: Increased pain, swelling, warmth, or redness at the biopsy site Red streaks leading from the biopsy site Yellow or green drainage from the biopsy site A fever (temperature over 100.49F) chills, or both    Moderate Conscious Sedation-Care After  This sheet gives you information about how to care for yourself after your procedure. Your health care provider may also give you  more specific instructions. If you have problems or questions, contact your health care provider.  After the procedure, it is common to have: Sleepiness for several hours. Impaired judgment for several hours. Difficulty with balance. Vomiting if you eat too soon.  Follow these instructions at home:  Rest. Do not participate in activities where you could fall or become injured. Do not drive or use machinery. Do not drink alcohol. Do not take sleeping pills or medicines that cause drowsiness. Do not make important decisions or sign legal documents. Do not take care of children on your own.  Eating and drinking Follow the diet recommended by your health care provider. Drink enough fluid to keep your urine pale yellow. If you vomit: Drink water, juice, or soup when you can drink without vomiting. Make sure you have little or no nausea before eating solid foods.  General instructions Take over-the-counter and prescription medicines only as told by your health care provider. Have a responsible adult stay with you for the time you are told. It is important to have someone help care for you until you are awake and alert. Do not smoke. Keep all follow-up visits as told by your health care provider. This is important.  Contact a health care provider if: You are still sleepy or having trouble with balance after 24 hours. You feel light-headed. You keep feeling nauseous or you keep vomiting. You develop a rash. You have a fever. You  have redness or swelling around the IV site.  Get help right away if: You have trouble breathing. You have new-onset confusion at home.  This information is not intended to replace advice given to you by your health care provider. Make sure you discuss any questions you have with your healthcare provider.

## 2023-09-05 NOTE — Procedures (Signed)
Pre-procedure Diagnosis: Thrombocytopenia  Post-procedure Diagnosis: Same  Technically successful CT guided bone marrow aspiration and biopsy of left iliac crest.   Complications: None Immediate EBL: Trace  Signed: Simonne Come Pager: [phone] 09/05/2023, 9:30 AM

## 2023-09-10 LAB — SURGICAL PATHOLOGY

## 2023-09-10 NOTE — Addendum Note (Signed)
Encounter addended by: Laqueta Due, PA-C on: 09/10/2023 11:00 AM  Actions taken: Delete clinical note

## 2023-09-13 ENCOUNTER — Encounter (HOSPITAL_COMMUNITY): Payer: Self-pay

## 2023-09-14 NOTE — Progress Notes (Unsigned)
[facility] OFFICE PROGRESS NOTE  Panosh, Neta Mends, MD [address] 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 male returns to the clinic today for a follow-up visit.  The patient establish care in the clinic on 09/17/2023 for the chief complaint of pancytopenia.  The patient then followed up with Dr. Arbutus Ped on 08/26/2023 to review his results.  There was no clear etiology for his pancytopenia.  Therefore, Dr. Arbutus Ped discussed bone marrow biopsy and aspirate.  This was performed on 09/05/2023.  The patient is here today for evaluation and to review the results. He tolerated the procedure well. He has some soreness in that site and did want to make sure the skin was healing. He is still active and walks several miles per day. He walked 8.5 miles already today.   His only complaint today is arthritis in his toe.  Otherwise he denies any major changes in his health since he was seen.  He continues to deny any unusual bleeding.  He denies any blood thinner or NSAID use.  Denies any fatigue, shortness of breath, or chest pain.  Denies any history of any stomach ulcers.  Denies frequent blood donation.  Denies any recent or frequent infections.  He denies abnormal bleeding or bruising. Denies any nausea, vomiting, diarrhea, or constipation.  Denies fevers, chills, night sweats, or unexplained weight loss.    MEDICAL HISTORY: Past Medical History:  Diagnosis Date   Allergy    CHEST PAIN UNSPECIFIED 01/04/2010   Qualifier: Diagnosis of  By: Shirlee Latch, MD, [name]     Diabetes [facility])    walked and lost weight and diabetes went away per patient   Diverticulosis    GERD (gastroesophageal reflux disease)    Headaches due to old head injury    Hyperlipidemia    Internal hemorrhoids    RECTAL BLEEDING 09/14/2010   Qualifier:  Diagnosis of  By: Fabian Sharp MD, Neta Mends    Renal cyst    Rotator cuff (capsule) sprain 05/27/2013   Right     Sleep apnea    URTICARIA, ALLERGIC 12/30/2007   Qualifier: Diagnosis of  By: Clent Ridges MD, Tera Mater     ALLERGIES:  is allergic to advil [ibuprofen], naproxen, piroxicam, statins, and tylenol [acetaminophen].  MEDICATIONS:  Current Outpatient Medications  Medication Sig Dispense Refill   ibuprofen (ADVIL) 800 MG tablet Take 800 mg by mouth every 8 (eight) hours as needed.     omeprazole (PRILOSEC) 40 MG capsule Take 1 capsule (40 mg total) by mouth daily before breakfast. (Patient not taking: Reported on 06/18/2023) 90 capsule 3   tiZANidine (ZANAFLEX) 4 MG capsule Take 1 capsule (4 mg total) by mouth at bedtime as needed for muscle spasms (neck pain). (Patient not taking: Reported on 07/29/2023) 24 capsule 1   No current facility-administered medications for this visit.    SURGICAL HISTORY:  Past Surgical History:  Procedure Laterality Date   COLONOSCOPY  12/27/2009   Brodie-hyperplastic polyp   INGUINAL HERNIA REPAIR Right 09/2022   KNEE ARTHROSCOPY     right   under the chin surgery       REVIEW OF SYSTEMS:   Review of Systems  Constitutional: Negative for appetite change, chills, fatigue, fever and unexpected weight change.  HENT: Negative for mouth sores, nosebleeds, sore throat and trouble swallowing.   Eyes: Negative for eye  problems and icterus.  Respiratory: Negative for cough, hemoptysis, shortness of breath and wheezing.   Cardiovascular: Negative for chest pain and leg swelling.  Gastrointestinal: Negative for abdominal pain, constipation, diarrhea, nausea and vomiting.  Genitourinary: Negative for bladder incontinence, difficulty urinating, dysuria, frequency and hematuria.   Musculoskeletal: Positive for arthritis. Negative for back pain, gait problem, neck pain and neck stiffness.  Skin: Negative for itching and rash.  Neurological: Negative for dizziness,  extremity weakness, gait problem, headaches, light-headedness and seizures.  Hematological: Negative for adenopathy. Does not bruise/bleed easily.  Psychiatric/Behavioral: Negative for confusion, depression and sleep disturbance. The patient is not nervous/anxious.     PHYSICAL EXAMINATION:  There were no vitals taken for this visit.  ECOG PERFORMANCE STATUS: 0  Physical Exam  Constitutional: Oriented to person, place, and time and well-developed, well-nourished, and in no distress.  HENT:  Head: Normocephalic and atraumatic.  Mouth/Throat: Oropharynx is clear and moist. No oropharyngeal exudate.  Eyes: Conjunctivae are normal. Right eye exhibits no discharge. Left eye exhibits no discharge. No scleral icterus.  Neck: Normal range of motion. Neck supple.  Cardiovascular: Normal rate, regular rhythm, normal heart sounds and intact distal pulses.   Pulmonary/Chest: Effort normal and breath sounds normal. No respiratory distress. No wheezes. No rales.  Abdominal: Soft. Bowel sounds are normal. Exhibits no distension and no mass. There is no tenderness.  Musculoskeletal: Normal range of motion. Exhibits no edema.  Lymphadenopathy:    No cervical adenopathy.  Neurological: Alert and oriented to person, place, and time. Exhibits normal muscle tone. Gait normal. Coordination normal.  Skin: Skin is warm and dry. No rash noted. Not diaphoretic. No erythema. No pallor.  Psychiatric: Mood, memory and judgment normal.  Vitals reviewed.  LABORATORY DATA: Lab Results  Component Value Date   WBC 4.0 09/05/2023   HGB 12.1 (L) 09/05/2023   HCT 37.2 (L) 09/05/2023   MCV 91.6 09/05/2023   PLT 92 (L) 09/05/2023      Chemistry      Component Value Date/Time   NA 141 07/18/2023 1326   NA 140 03/20/2019 1115   K 3.8 07/18/2023 1326   CL 107 07/18/2023 1326   CO2 29 07/18/2023 1326   BUN 15 07/18/2023 1326   BUN 12 03/20/2019 1115   CREATININE 0.99 07/18/2023 1326      Component Value  Date/Time   CALCIUM 9.4 07/18/2023 1326   ALKPHOS 50 07/18/2023 1326   AST 18 07/18/2023 1326   ALT 13 07/18/2023 1326   BILITOT 0.6 07/18/2023 1326       RADIOGRAPHIC STUDIES:  CT BONE MARROW BIOPSY & ASPIRATION  Result Date: 09/05/2023 INDICATION: Pancytopenia of uncertain etiology. Please perform CT-guided bone marrow biopsy for tissue diagnostic purposes. EXAM: CT-GUIDED BONE MARROW BIOPSY AND ASPIRATION MEDICATIONS: None ANESTHESIA/SEDATION: Moderate (conscious) sedation was employed during this procedure as administered by the Interventional Radiology RN. A total of Versed 2 mg and Fentanyl 100 mcg was administered intravenously. Moderate Sedation Time: 10 minutes. The patient's level of consciousness and vital signs were monitored continuously by radiology nursing throughout the procedure under my direct supervision. COMPLICATIONS: None immediate. PROCEDURE: Informed consent was obtained from the patient following an explanation of the procedure, risks, benefits and alternatives. The patient understands, agrees and consents for the procedure. All questions were addressed. A time out was performed prior to the initiation of the procedure. The patient was positioned prone and non-contrast localization CT was performed of the pelvis to demonstrate the iliac marrow spaces. The operative  site was prepped and draped in the usual sterile fashion. Under sterile conditions and local anesthesia, a 22 gauge spinal needle was utilized for procedural planning. Next, an 11 gauge coaxial bone biopsy needle was advanced into the left iliac marrow space. Needle position was confirmed with CT imaging. Initially, a bone marrow aspiration was performed. Next, a bone marrow biopsy was obtained with the 11 gauge outer bone marrow device. The needle was removed and superficial hemostasis was obtained with manual compression. A dressing was applied. The patient tolerated the procedure well without immediate post  procedural complication. IMPRESSION: Successful CT guided left iliac bone marrow aspiration and core biopsy. Electronically Signed   By: Simonne Come M.D.   On: 09/05/2023 13:32     ASSESSMENT/PLAN:  This is a very pleasant 67 year old African-American male referred to clinic for pancytopenia  The patient has several lab studies which were all unremarkable.  Therefore Dr. Arbutus Ped recommended a bone marrow biopsy and aspirate.  The patient was seen with Dr. Arbutus Ped today.  Dr. Arbutus Ped reviewed his bone marrow biopsy and aspirate which was unremarkable.  Dr. Arbutus Ped recommends that the patient continue on observation with repeat blood work in 6 months. Dr. Arbutus Ped recommended that he take a mens once a day multivitamin.   The patient was advised to call immediately if she has any concerning symptoms in the interval. The patient voices understanding of current disease status and treatment options and is in agreement with the current care plan. All questions were answered. The patient knows to call the clinic with any problems, questions or concerns. We can certainly see the patient much sooner if necessary   No orders of the defined types were placed in this encounter.   Gauri Galvao L Leota Maka, PA-C 09/14/23  ADDENDUM: Hematology/Oncology Attending: I had a face-to-face encounter with the patient today.  I reviewed his record, lab and recommended his care plan.  This is a very pleasant 67 years old African-American male presented for evaluation of pancytopenia of unclear etiology. The patient recently underwent a bone marrow biopsy and aspirate.  I personally discussed the result with the patient and it showed no concerning findings for myelodysplastic or myeloproliferative disorder. I assured the patient of no concerning bone marrow abnormality at this point.  I recommended for him to continue on observation and daily multivitamins. I will see him back for follow-up visit in 6 months for  evaluation and repeat blood work. He was advised to call immediately if he has any other concerning symptoms in the interval. The total time spent in the appointment was 30 minutes. Disclaimer: This note was dictated with voice recognition software. Similar sounding words can inadvertently be transcribed and may be missed upon review. Lajuana Matte, MD

## 2023-09-17 ENCOUNTER — Inpatient Hospital Stay (HOSPITAL_BASED_OUTPATIENT_CLINIC_OR_DEPARTMENT_OTHER): Payer: Medicare Other | Admitting: Physician Assistant

## 2023-09-17 ENCOUNTER — Inpatient Hospital Stay: Payer: Medicare Other | Attending: Physician Assistant

## 2023-09-17 VITALS — BP 114/70 | HR 61 | Temp 98.1°F | Resp 13

## 2023-09-17 DIAGNOSIS — D72819 Decreased white blood cell count, unspecified: Secondary | ICD-10-CM

## 2023-09-17 DIAGNOSIS — D61818 Other pancytopenia: Secondary | ICD-10-CM

## 2023-09-17 DIAGNOSIS — Z79899 Other long term (current) drug therapy: Secondary | ICD-10-CM | POA: Diagnosis not present

## 2023-09-17 LAB — CBC WITH DIFFERENTIAL (CANCER CENTER ONLY)
Abs Immature Granulocytes: 0.01 10*3/uL (ref 0.00–0.07)
Basophils Absolute: 0 10*3/uL (ref 0.0–0.1)
Basophils Relative: 1 %
Eosinophils Absolute: 0.2 10*3/uL (ref 0.0–0.5)
Eosinophils Relative: 5 %
HCT: 36.2 % — ABNORMAL LOW (ref 39.0–52.0)
Hemoglobin: 12.2 g/dL — ABNORMAL LOW (ref 13.0–17.0)
Immature Granulocytes: 0 %
Lymphocytes Relative: 35 %
Lymphs Abs: 1.1 10*3/uL (ref 0.7–4.0)
MCH: 30 pg (ref 26.0–34.0)
MCHC: 33.7 g/dL (ref 30.0–36.0)
MCV: 89.2 fL (ref 80.0–100.0)
Monocytes Absolute: 0.2 10*3/uL (ref 0.1–1.0)
Monocytes Relative: 8 %
Neutro Abs: 1.6 10*3/uL — ABNORMAL LOW (ref 1.7–7.7)
Neutrophils Relative %: 51 %
Platelet Count: 100 10*3/uL — ABNORMAL LOW (ref 150–400)
RBC: 4.06 MIL/uL — ABNORMAL LOW (ref 4.22–5.81)
RDW: 12.5 % (ref 11.5–15.5)
WBC Count: 3.1 10*3/uL — ABNORMAL LOW (ref 4.0–10.5)
nRBC: 0 % (ref 0.0–0.2)

## 2023-10-23 ENCOUNTER — Ambulatory Visit: Payer: Medicare Other | Admitting: Internal Medicine

## 2023-10-23 ENCOUNTER — Encounter: Payer: Self-pay | Admitting: Internal Medicine

## 2023-10-23 VITALS — BP 100/64 | HR 69 | Temp 98.2°F | Ht 68.0 in | Wt 209.4 lb

## 2023-10-23 DIAGNOSIS — M25511 Pain in right shoulder: Secondary | ICD-10-CM | POA: Diagnosis not present

## 2023-10-23 DIAGNOSIS — M5412 Radiculopathy, cervical region: Secondary | ICD-10-CM | POA: Diagnosis not present

## 2023-10-23 NOTE — Progress Notes (Signed)
Chief Complaint  Patient presents with   Shoulder Pain    Pt c/o R shoulder pain. Going on this past 3 wks ago. Took ibuprofen for pain. Pain giving him dizzy effect. Pt mention he has jury duty tomorrow, but unable to go. Brought form to be fill.    HPI: Brad Terry 67 y.o. come in for  help with new problem  for weeks  R shoulder pain hurts when rols on it at night no specific injury . R  shoulder  painful   a( has golf at times)  Hsa jury duty tomorrow and needs  medica lexcuse delay   Has seen spine specialist ofr lumbar and cervical radiculopathy and sent to pt but had  a difficult experience and thinking about chiro instead or other venue  Left  shoulder  pain years ago and go better witrh injection.  ROS: See pertinent positives and negatives per HPI. Had hematology evaluation and bone marrow following per   Dr Shirline Frees  Past Medical History:  Diagnosis Date   Allergy    CHEST PAIN UNSPECIFIED 01/04/2010   Qualifier: Diagnosis of  By: Shirlee Latch, MD, [name]     Diabetes [facility])    walked and lost weight and diabetes went away per patient   Diverticulosis    GERD (gastroesophageal reflux disease)    Headaches due to old head injury    Hyperlipidemia    Internal hemorrhoids    RECTAL BLEEDING 09/14/2010   Qualifier: Diagnosis of  By: Fabian Sharp MD, Neta Mends    Renal cyst    Rotator cuff (capsule) sprain 05/27/2013   Right     Sleep apnea    URTICARIA, ALLERGIC 12/30/2007   Qualifier: Diagnosis of  By: Clent Ridges MD, Tera Mater     Family History  Problem Relation Age of Onset   Diabetes Mother    Diabetes Brother        x 2   Prostate cancer Brother        x 2   Colon cancer Neg Hx    Colon polyps Neg Hx    Esophageal cancer Neg Hx    Rectal cancer Neg Hx    Stomach cancer Neg Hx     Social History   Socioeconomic History   Marital status: Married    Spouse name: [name]   Number of children: 4   Years of education: Not on file   Highest education level:  Not on file  Occupational History   Occupation: Education officer, environmental  Tobacco Use   Smoking status: Never    Passive exposure: Never   Smokeless tobacco: Never  Vaping Use   Vaping status: Never Used  Substance and Sexual Activity   Alcohol use: No    Alcohol/week: 0.0 standard drinks of alcohol   Drug use: No   Sexual activity: Not on file  Other Topics Concern   Not on file  Social History Narrative   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   Tries to walk with exercise      [place] country of origin      Caffeine- seldom    Social Drivers of Corporate investment banker Strain: Not on [employer] Insecurity: Not on file  Transportation Needs: Not on file  Physical Activity: Not on file  Stress: Not on file  Social Connections: Not on file    No outpatient medications prior to visit.   No facility-administered medications prior to  visit.     EXAM:  BP 100/64 (BP Location: Right Arm, Patient Position: Sitting, Cuff Size: Large)   Pulse 69   Temp 98.2 F (36.8 C) (Oral)   Ht 5\' 8"  (1.727 m)   Wt 209 lb 6.4 oz (95 kg)   SpO2 95%   BMI 31.84 kg/m   Body mass index is 31.84 kg/m.  GENERAL: vitals reviewed and listed above, alert, oriented, appears well hydrated and in no acute distress HEENT: atraumatic, conjunctiva  clear, no obvious abnormalities on inspection of external nose and ears  NECK: no obvious masses on inspection palpation   MS: moves all extremities without noticeable focal  abnormality discomfort righ shoulder rom elvation  internal rotating. No weakness   PSYCH: pleasant and cooperative, no obvious depression or anxiety Lab Results  Component Value Date   WBC 3.1 (L) 09/17/2023   HGB 12.2 (L) 09/17/2023   HCT 36.2 (L) 09/17/2023   PLT 100 (L) 09/17/2023   GLUCOSE 97 07/18/2023   CHOL 171 02/14/2023   TRIG 62.0 02/14/2023   HDL 59.70 02/14/2023   LDLDIRECT 140.2 07/31/2012   LDLCALC 99 02/14/2023   ALT 13 07/18/2023    AST 18 07/18/2023   NA 141 07/18/2023   K 3.8 07/18/2023   CL 107 07/18/2023   CREATININE 0.99 07/18/2023   BUN 15 07/18/2023   CO2 29 07/18/2023   TSH 2.50 02/14/2023   PSA 1.36 02/14/2023   HGBA1C 5.8 02/14/2023   BP Readings from Last 3 Encounters:  10/23/23 100/64  09/17/23 114/70  09/05/23 131/78    ASSESSMENT AND PLAN:  Discussed the following assessment and plan:  Right shoulder pain, unspecified chronicity - Plan: Ambulatory referral to Sports Medicine  Cervical radiculopathy - Plan: Ambulatory referral to Sports Medicine Suggest look into other PT   prefer no chiro on neck predicament vs PT  Medical excuse of delay  from jury duty   Ref back to sm for shoulder  issues  optimize  Continues to exercise as he feel  may limit progression of arthritis pain . ( Walks many miles)  -Patient advised to return or notify health care team  if  new concerns arise.  Patient Instructions  I will refer  you back to sports medicine about your r shoulder . And jury excuse for medical reasons.   Suggest other pt for neck  pain and radiation.  as we discussed .  Neta Mends. Durand Wittmeyer M.D.

## 2023-10-23 NOTE — Patient Instructions (Signed)
I will refer  you back to sports medicine about your r shoulder . And jury excuse for medical reasons.   Suggest other pt for neck  pain and radiation.  as we discussed .

## 2023-11-04 NOTE — Progress Notes (Deleted)
    Ben Jackson D.[facility] [address] [place] [address] Phone: [phone]   Assessment and Plan:     There are no diagnoses linked to this encounter.  ***   Pertinent previous records reviewed include ***    Follow Up: ***     Subjective:   I, Carloyn Lahue, am serving as a neurosurgeon for Doctor Morene Mace  Chief Complaint: right shoulder and neck pain   HPI:   11/11/2023 Patient is a 67 year old male with right shoulder and neck pain. Patient states   Relevant Historical Information: ***  Additional pertinent review of systems negative.  No current outpatient medications on file.   Objective:     There were no vitals filed for this visit.    There is no height or weight on file to calculate BMI.    Physical Exam:    ***   Electronically signed by:  Odis Mace D[facility] 8:18 AM 11/04/23

## 2023-11-11 ENCOUNTER — Ambulatory Visit: Payer: Medicare Other | Admitting: Sports Medicine

## 2023-12-12 ENCOUNTER — Other Ambulatory Visit: Payer: Self-pay

## 2023-12-20 ENCOUNTER — Other Ambulatory Visit: Payer: Self-pay

## 2023-12-20 DIAGNOSIS — K760 Fatty (change of) liver, not elsewhere classified: Secondary | ICD-10-CM

## 2024-02-20 ENCOUNTER — Encounter: Payer: Self-pay | Admitting: Family Medicine

## 2024-02-20 ENCOUNTER — Ambulatory Visit (INDEPENDENT_AMBULATORY_CARE_PROVIDER_SITE_OTHER): Admitting: Family Medicine

## 2024-02-20 VITALS — BP 110/70 | HR 53 | Temp 98.1°F | Ht 68.0 in | Wt 208.0 lb

## 2024-02-20 DIAGNOSIS — K219 Gastro-esophageal reflux disease without esophagitis: Secondary | ICD-10-CM | POA: Diagnosis not present

## 2024-02-20 DIAGNOSIS — R109 Unspecified abdominal pain: Secondary | ICD-10-CM | POA: Diagnosis not present

## 2024-02-20 DIAGNOSIS — R1319 Other dysphagia: Secondary | ICD-10-CM

## 2024-02-20 MED ORDER — PANTOPRAZOLE SODIUM 40 MG PO TBEC: 40.0000 mg | DELAYED_RELEASE_TABLET | Freq: Two times a day (BID) | ORAL | 3 refills | Status: AC

## 2024-02-20 NOTE — Progress Notes (Signed)
 Established Patient Office Visit   Subjective  Patient ID: Brad Terry, male    DOB: [birth year]  Age: 68 y.o. MRN: [id number]  Chief Complaint  Patient presents with   Gastroesophageal Reflux    Patient is a 68 year old male followed by Dr. Ethel Henry and seen for ongoing concern.  Patient endorses increased belching, pain in neck with swallowing x few weeks.  Patient started taking Pepto-Bismol daily for symptoms.  Patient mentions history of acid reflux/seeing GI.  Not currently on prescription medication.  Does not recall any specific foods to cause symptoms.  Patient also mentions a "faint pain" in left lower back x 3 months.    Patient Active Problem List   Diagnosis Date Noted   Other pancytopenia (HCC) 07/18/2023   Primary osteoarthritis of right hip 07/21/2021   Internal hemorrhoids 01/06/2015   Skin lesion 04/26/2014   Right shoulder pain 04/26/2014   Left-sided low back pain without sciatica 04/26/2014   Neck pain on right side 04/17/2013   Family history of diabetes mellitus type II 08/04/2012   Family hx of prostate cancer 08/04/2012   Leukocytopenia 03/14/2010   HEMORRHOIDS 03/14/2010   DIVERTICULOSIS, COLON 03/14/2010   History of colonic polyps 03/14/2010   NONSPECIFIC ABNORMAL ELECTROCARDIOGRAM 09/13/2009   HEMATURIA, MICROSCOPIC, HX OF 09/13/2009   HIP PAIN, RIGHT 07/19/2009   KNEE PAIN, RIGHT 07/19/2009   ANEMIA 01/26/2008   RHINITIS 10/06/2007   MRI, BRAIN, ABNORMAL 10/06/2007   Thrombocytopenia (HCC) 09/11/2007   MEMORY LOSS 09/11/2007   FLANK PAIN, LEFT 09/11/2007   HYPERLIPIDEMIA NEC/NOS 07/10/2007   Past Medical History:  Diagnosis Date   Allergy     CHEST PAIN UNSPECIFIED 01/04/2010   Qualifier: Diagnosis of  By: Mitzie Anda, MD, [name]     Diabetes [facility])    walked and lost weight and diabetes went away per patient   Diverticulosis    GERD (gastroesophageal reflux disease)    Headaches due to old head injury    Hyperlipidemia     Internal hemorrhoids    RECTAL BLEEDING 09/14/2010   Qualifier: Diagnosis of  By: Ethel Henry MD, Joaquim Muir    Renal cyst    Rotator cuff (capsule) sprain 05/27/2013   Right     Sleep apnea    URTICARIA, ALLERGIC 12/30/2007   Qualifier: Diagnosis of  By: Alyne Babinski MD, Lenis Quin    Past Surgical History:  Procedure Laterality Date   COLONOSCOPY  12/27/2009   Brodie-hyperplastic polyp   INGUINAL HERNIA REPAIR Right 09/2022   KNEE ARTHROSCOPY     right   under the chin surgery      Social History   Tobacco Use   Smoking status: Never    Passive exposure: Never   Smokeless tobacco: Never  Vaping Use   Vaping status: Never Used  Substance Use Topics   Alcohol use: No    Alcohol/week: 0.0 standard drinks of alcohol   Drug use: No   Family History  Problem Relation Age of Onset   Diabetes Mother    Diabetes Brother        x 2   Prostate cancer Brother        x 2   Colon cancer Neg Hx    Colon polyps Neg Hx    Esophageal cancer Neg Hx    Rectal cancer Neg Hx    Stomach cancer Neg Hx    Allergies  Allergen Reactions   Advil  [Ibuprofen ] Hives   Naproxen     Able to take  aleve, tinnitus   Piroxicam     REACTION: tinnitus   Statins     Hx of se of 2 statins    Tylenol [Acetaminophen] Hives      ROS Negative unless stated above    Objective:     BP 110/70   Pulse (!) 53   Temp 98.1 F (36.7 C) (Oral)   Ht 5\' 8"  (1.727 m)   Wt 208 lb (94.3 kg)   SpO2 97%   BMI 31.63 kg/m  BP Readings from Last 3 Encounters:  02/20/24 110/70  10/23/23 100/64  09/17/23 114/70   Wt Readings from Last 3 Encounters:  02/20/24 208 lb (94.3 kg)  10/23/23 209 lb 6.4 oz (95 kg)  09/05/23 208 lb 4.8 oz (94.5 kg)    Physical Exam Constitutional:      General: He is not in acute distress.    Appearance: Normal appearance.  HENT:     Head: Normocephalic and atraumatic.     Nose: Nose normal.     Mouth/Throat:     Mouth: Mucous membranes are moist.  Cardiovascular:     Rate and  Rhythm: Normal rate and regular rhythm.     Heart sounds: Normal heart sounds. No murmur heard.    No gallop.  Pulmonary:     Effort: Pulmonary effort is normal. No respiratory distress.     Breath sounds: Normal breath sounds. No wheezing, rhonchi or rales.  Musculoskeletal:       Arms:  Skin:    General: Skin is warm and dry.  Neurological:     Mental Status: He is alert and oriented to person, place, and time.      No results found for any visits on 02/20/24.    Assessment & Plan:  Gastroesophageal reflux disease, unspecified whether esophagitis present -     Ambulatory referral to Gastroenterology -     Pantoprazole  Sodium; Take 1 tablet (40 mg total) by mouth 2 (two) times daily before a meal.  Dispense: 60 tablet; Refill: 3  Esophageal dysphagia -     Ambulatory referral to Gastroenterology -     Pantoprazole  Sodium; Take 1 tablet (40 mg total) by mouth 2 (two) times daily before a meal.  Dispense: 60 tablet; Refill: 3  Left flank pain  Patient with increased GERD symptoms.  Restart PPI twice daily.  Referral placed for gastroenterology.  Patient informed of repeat abdominal ultrasound order system.  Can call to have this set up.  Given strict precautions.  Area of concern on left flank possibly 2/2 a cyst given superficial nature.  Consider imaging to further distinguish.  Return if symptoms worsen or fail to improve.   Viola Greulich, MD

## 2024-02-20 NOTE — Patient Instructions (Signed)
 It looks like your gastroenterologist (stomach doctor) ordered a repeat ultrasound of your abdomen to be done in 6 months which is about now.  The order has already been placed and just needs to be scheduled.  A prescription for Protonix medication to reduce the acid in your stomach was sent to your pharmacy.  You can take this once or twice a day depending on your symptoms.  I also placed a new referral for the gastroenterologist so they should call you about scheduling an appointment.

## 2024-03-03 ENCOUNTER — Ambulatory Visit: Admitting: Podiatry

## 2024-03-10 ENCOUNTER — Ambulatory Visit (INDEPENDENT_AMBULATORY_CARE_PROVIDER_SITE_OTHER): Admitting: Podiatry

## 2024-03-10 ENCOUNTER — Encounter: Payer: Self-pay | Admitting: Podiatry

## 2024-03-10 DIAGNOSIS — M19071 Primary osteoarthritis, right ankle and foot: Secondary | ICD-10-CM | POA: Diagnosis not present

## 2024-03-10 DIAGNOSIS — M79609 Pain in unspecified limb: Secondary | ICD-10-CM | POA: Diagnosis not present

## 2024-03-10 DIAGNOSIS — M48061 Spinal stenosis, lumbar region without neurogenic claudication: Secondary | ICD-10-CM | POA: Insufficient documentation

## 2024-03-10 DIAGNOSIS — B351 Tinea unguium: Secondary | ICD-10-CM | POA: Diagnosis not present

## 2024-03-10 NOTE — Progress Notes (Signed)
 He presents today complaining of pain to his first metatarsal phalangeal joint of the right foot.  States that the pain hurts and it radiates all the way up to his back and chest.  He states that is getting to the point where it is becoming more painful with activity lessening his ability to exercise and maintain general good health.  Objective: Vital signs are stable alert and oriented x 3.  Traumatic injury hallux left resulted in shortening of the proximal phalanx and moderate to severe hallux limitus of the first metatarsal phalangeal joint right foot.  Palpable spurs are noted tenderness is noted plantarly dorsally and medially.  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  Plan: Discussed with him in detail today fusion of that first metatarsal phalangeal joint he understands this and is amendable to it I did refer him to Dr. Michalene Agee for this procedure to be performed and he will follow-up with Dr. Michalene Agee in the near future.

## 2024-03-16 ENCOUNTER — Inpatient Hospital Stay: Payer: Medicare Other | Attending: Physician Assistant

## 2024-03-16 ENCOUNTER — Inpatient Hospital Stay: Payer: Medicare Other | Admitting: Internal Medicine

## 2024-03-26 ENCOUNTER — Ambulatory Visit (INDEPENDENT_AMBULATORY_CARE_PROVIDER_SITE_OTHER): Admitting: Gastroenterology

## 2024-03-26 ENCOUNTER — Encounter: Payer: Self-pay | Admitting: Gastroenterology

## 2024-03-26 VITALS — BP 120/70 | HR 78 | Ht 68.0 in | Wt 203.0 lb

## 2024-03-26 DIAGNOSIS — R059 Cough, unspecified: Secondary | ICD-10-CM | POA: Diagnosis not present

## 2024-03-26 DIAGNOSIS — R1310 Dysphagia, unspecified: Secondary | ICD-10-CM

## 2024-03-26 DIAGNOSIS — K760 Fatty (change of) liver, not elsewhere classified: Secondary | ICD-10-CM | POA: Diagnosis not present

## 2024-03-26 NOTE — Patient Instructions (Addendum)
 Take Pantoprazole  once daily.   You have been scheduled for a Barium Esophogram at [facility] (1st floor of the hospital) on 04/16/24 at 9:00 am. Please arrive 30 minutes prior to your appointment for registration. Make certain not to have anything to eat or drink 3 hours prior to your test. If you need to reschedule for any reason, please contact radiology at [phone] to do so. __________________________________________________________________ A barium swallow is an examination that concentrates on views of the esophagus. This tends to be a double contrast exam (barium and two liquids which, when combined, create a gas to distend the wall of the oesophagus) or single contrast (non-ionic iodine based). The study is usually tailored to your symptoms so a good history is essential. Attention is paid during the study to the form, structure and configuration of the esophagus, looking for functional disorders (such as aspiration, dysphagia, achalasia, motility and reflux) EXAMINATION You may be asked to change into a gown, depending on the type of swallow being performed. A radiologist and radiographer will perform the procedure. The radiologist will advise you of the type of contrast selected for your procedure and direct you during the exam. You will be asked to stand, sit or lie in several different positions and to hold a small amount of fluid in your mouth before being asked to swallow while the imaging is performed .In some instances you may be asked to swallow barium coated marshmallows to assess the motility of a solid food bolus. The exam can be recorded as a digital or video fluoroscopy procedure. POST PROCEDURE It will take 1-2 days for the barium to pass through your system. To facilitate this, it is important, unless otherwise directed, to increase your fluids for the next 24-48hrs and to resume your normal diet.  This test typically takes about 30 minutes to  perform. __________________________________________________________________________________  Brad Terry have been scheduled for an abdominal ultrasound at [facility] (1st floor of hospital) on 04/03/24 at 7:30 am . Please arrive 30 minutes prior to your appointment for registration. Make certain not to have anything to eat or drink 6 hours prior to your appointment. Should you need to reschedule your appointment, please contact radiology at [phone]. This test typically takes about 30 minutes to perform.  Follow-up on : 06/08/24 at 3:00 pm with Brad Gaul, PA-C  Due to recent changes in healthcare laws, you may see the results of your imaging and laboratory studies on MyChart before your provider has had a chance to review them.  We understand that in some cases there may be results that are confusing or concerning to you. Not all laboratory results come back in the same time frame and the provider may be waiting for multiple results in order to interpret others.  Please give us  48 hours in order for your provider to thoroughly review all the results before contacting the office for clarification of your results.   Thank you for choosing me and [facility].  Brad Gaul, PA-C

## 2024-03-26 NOTE — Progress Notes (Signed)
 Brad Terry [id number] [birth year]   Chief Complaint: Esophageal difficulties  Referring Provider: Reginal Capra, MD Primary GI MD: Dr. Leonia Raman  HPI: Brad Terry is a 68 y.o. male with past medical history of diabetes, diverticulosis, GERD, HLD, internal hemorrhoids, sleep apnea who presents today for a complaint of esophageal problems.    Last office visit 05/28/2023 with Santina Cull, PA-C for evaluation of reflux.  At that time patient was having worsening GERD symptoms, occasional right and left upper abdominal discomfort, occasional dysphagia to solids or liquids.  Repeat EGD with possible dilation was recommended along with colonoscopy as patient was due for repeat. No dilation was performed.  Today patient states that he is able to swallow, food does not get stuck, but does sometimes feel like it goes down slowly.  He will sometimes have a sensation that food is sitting in his lower chest/upper abdomen.  He was prescribed Protonix  but has only taken 1 dose of this.  He denies vomiting, denies regurgitating food.  Occasionally he may have some nausea.  Denies abdominal pain.  Bowel movements are normal.  Once every other week he takes some prune juice for occasional constipation.  Denies melena or blood in stool.  He recently took a trip to [place], returned 02/13/2024 and had a sore throat and cough at that time.  His wife also developed a cough when they returned, which he states was due to seasonal allergies.  States his coughing is better now, but at night he has postnasal drip and chest congestion with some phlegm production which is typically clear but occasionally yellow.  He has not discussed this with his PCP.   Previous GI Procedures/Imaging   EGD 07/29/2023 - Z- line regular, 40 cm from the incisors.  - No gross lesions in the entire esophagus.  - Normal stomach.  - Normal examined duodenum.  - No specimens collected.  Colonoscopy  07/29/2023 - One 3 mm polyp in the transverse colon, removed with a cold snare. Resected and retrieved.  - Moderate diverticulosis in the sigmoid colon, in the descending colon, in the transverse colon and in the ascending colon. - Non- bleeding external and internal hemorrhoids. - Recall 5 years Path: - Hyperplastic polyp  US  abdomen complete 07/09/2023 1. No cholelithiasis or sonographic evidence for acute cholecystitis. 2. Mildly increased hepatic parenchymal echogenicity suggestive of steatosis. 3. Septated cyst superior pole right kidney. Recommend follow-up renal ultrasound in 6 months to ensure stability.  Colonoscopy 02/09/2020 - Three 5 to 9 mm polyps in the transverse colon and in the ascending colon, removed with a cold snare. Resected and retrieved.  - One less than 1 mm polyp in the ascending colon, removed with a cold biopsy forceps. Resected and retrieved.  - Mild diverticulosis in the sigmoid colon, in the descending colon, in the transverse colon and in the ascending colon.  - Non- bleeding internal hemorrhoids.  - The examination was otherwise normal. Path: Surgical [P], colon, transverse and ascending, polyp (4) - TUBULAR ADENOMA(S). - HIGH GRADE DYSPLASIA IS NOT IDENTIFIED.  EGD 05/23/2010 - Irregular Z-line - Otherwise normal examination  Colonoscopy 12/27/2009 - Diminutive polyp in the rectum - Mild diverticulosis - Otherwise normal examination  Past Medical History:  Diagnosis Date   Allergy     CHEST PAIN UNSPECIFIED 01/04/2010   Qualifier: Diagnosis of  By: Mitzie Anda, MD, [name]     Diabetes [facility])    walked and lost weight and diabetes went away per patient   Diverticulosis  GERD (gastroesophageal reflux disease)    Headaches due to old head injury    Hyperlipidemia    Internal hemorrhoids    RECTAL BLEEDING 09/14/2010   Qualifier: Diagnosis of  By: Ethel Henry MD, Joaquim Muir    Renal cyst    Rotator cuff (capsule) sprain 05/27/2013   Right     Sleep apnea     URTICARIA, ALLERGIC 12/30/2007   Qualifier: Diagnosis of  By: Alyne Babinski MD, Lenis Quin     Past Surgical History:  Procedure Laterality Date   COLONOSCOPY  12/27/2009   Brodie-hyperplastic polyp   INGUINAL HERNIA REPAIR Right 09/2022   KNEE ARTHROSCOPY     right   under the chin surgery       Current Outpatient Medications  Medication Sig Dispense Refill   pantoprazole  (PROTONIX ) 40 MG tablet Take 1 tablet (40 mg total) by mouth 2 (two) times daily before a meal. 60 tablet 3   No current facility-administered medications for this visit.    Allergies as of 03/26/2024 - Review Complete 03/10/2024  Allergen Reaction Noted   Advil  [ibuprofen ] Hives 01/01/2023   Naproxen  09/11/2007   Piroxicam  09/11/2007   Statins  08/04/2012   Tylenol [acetaminophen] Hives 01/01/2023    Family History  Problem Relation Age of Onset   Diabetes Mother    Diabetes Brother        x 2   Prostate cancer Brother        x 2   Colon cancer Neg Hx    Colon polyps Neg Hx    Esophageal cancer Neg Hx    Rectal cancer Neg Hx    Stomach cancer Neg Hx     Social History   Tobacco Use   Smoking status: Never    Passive exposure: Never   Smokeless tobacco: Never  Vaping Use   Vaping status: Never Used  Substance Use Topics   Alcohol use: No    Alcohol/week: 0.0 standard drinks of alcohol   Drug use: No     Review of Systems:    Constitutional: No weight loss, fever, chills, weakness or fatigue Eyes: No change in vision Ears, Nose, Throat:  No change in hearing or congestion Skin: No rash or itching Cardiovascular: No chest pain, chest pressure or palpitations   Respiratory: No SOB  Gastrointestinal: See HPI and otherwise negative Genitourinary: No dysuria or change in urinary frequency Neurological: No headache, dizziness or syncope Musculoskeletal: No new muscle or joint pain Hematologic: No bleeding or bruising    Physical Exam:  Vital signs: BP 120/70   Pulse 78   Ht 5\' 8"   (1.727 m)   Wt 203 lb (92.1 kg)   SpO2 98%   BMI 30.87 kg/m    Constitutional: NAD, Well developed, Well nourished, alert and cooperative Head:  Normocephalic and atraumatic.  Eyes: No scleral icterus. Conjunctiva pink. Mouth: No oral lesions. Respiratory: Respirations even and unlabored. Lungs clear to auscultation bilaterally.  No wheezes, crackles, or rhonchi.  Cardiovascular:  Regular rate and rhythm. No murmurs. No peripheral edema. Gastrointestinal:  Soft, nondistended, nontender. No rebound or guarding. Normal bowel sounds. No appreciable masses or hepatomegaly. Rectal:  Not performed.  Neurologic:  Alert and oriented x4;  grossly normal neurologically.  Skin:   Dry and intact without significant lesions or rashes. Psychiatric: Oriented to person, place and time. Demonstrates good judgement and reason without abnormal affect or behaviors.   RELEVANT LABS AND IMAGING: CBC    Component Value Date/Time  WBC 3.1 (L) 09/17/2023 1505   WBC 4.0 09/05/2023 0810   RBC 4.06 (L) 09/17/2023 1505   HGB 12.2 (L) 09/17/2023 1505   HGB 12.1 (L) 03/20/2019 1115   HGB 13.2 11/29/2010 1432   HCT 36.2 (L) 09/17/2023 1505   HCT 36.5 (L) 03/20/2019 1115   HCT 39.2 11/29/2010 1432   PLT 100 (L) 09/17/2023 1505   PLT 108 (L) 11/29/2010 1432   MCV 89.2 09/17/2023 1505   MCV 90 03/20/2019 1115   MCV 85.2 11/29/2010 1432   MCH 30.0 09/17/2023 1505   MCHC 33.7 09/17/2023 1505   RDW 12.5 09/17/2023 1505   RDW 13.0 03/20/2019 1115   RDW 13.4 11/29/2010 1432   LYMPHSABS 1.1 09/17/2023 1505   LYMPHSABS 1.3 03/20/2019 1115   LYMPHSABS 1.4 11/29/2010 1432   MONOABS 0.2 09/17/2023 1505   MONOABS 0.3 11/29/2010 1432   EOSABS 0.2 09/17/2023 1505   EOSABS 0.1 03/20/2019 1115   BASOSABS 0.0 09/17/2023 1505   BASOSABS 0.0 03/20/2019 1115   BASOSABS 0.0 11/29/2010 1432    CMP     Component Value Date/Time   NA 141 07/18/2023 1326   NA 140 03/20/2019 1115   K 3.8 07/18/2023 1326   CL 107  07/18/2023 1326   CO2 29 07/18/2023 1326   GLUCOSE 97 07/18/2023 1326   BUN 15 07/18/2023 1326   BUN 12 03/20/2019 1115   CREATININE 0.99 07/18/2023 1326   CALCIUM 9.4 07/18/2023 1326   PROT 6.4 (L) 07/18/2023 1326   PROT 6.4 03/20/2019 1115   ALBUMIN 4.1 07/18/2023 1326   ALBUMIN 4.2 03/20/2019 1115   AST 18 07/18/2023 1326   ALT 13 07/18/2023 1326   ALKPHOS 50 07/18/2023 1326   BILITOT 0.6 07/18/2023 1326   GFRNONAA >60 07/18/2023 1326   GFRAA 102 03/20/2019 1115     Assessment/Plan:   Dysphagia Recently had EGD 07/2023 which was unremarkable, no dilation performed.  Patient feels that food goes down slowly, sometimes has sensation of food sitting in his lower chest/upper abdomen.  Question if his symptoms are related to acid reflux.  He has not been taking PPI, only took 1 dose.  - Will order barium swallow for further evaluation - Start Protonix  40 mg once daily  Cough Post-nasal drip Sounds like patient may have had a viral illness after returning from [place], may have some residual bronchitis.  Lungs clear to auscultation.  He denies shortness of breath or chest pain.  I do not think these symptoms are related to his dysphagia.    - Advised patient to reach out to PCP for further evaluation.  Hepatic steatosis - Due for repeat abdominal ultrasound.  This has previously been ordered, will work on scheduling today. - Consider further workup at follow up appointment.  Valiant Gaul, PA-C [facility] 03/26/2024, 10:07 AM  Patient Care Team: Reginal Capra, MD as PCP - Conrad Delaware, MD (Hematology and Oncology)

## 2024-04-01 ENCOUNTER — Encounter: Payer: Self-pay | Admitting: Family Medicine

## 2024-04-01 ENCOUNTER — Ambulatory Visit (INDEPENDENT_AMBULATORY_CARE_PROVIDER_SITE_OTHER): Admitting: Family Medicine

## 2024-04-01 ENCOUNTER — Ambulatory Visit: Admitting: Family Medicine

## 2024-04-01 ENCOUNTER — Ambulatory Visit (INDEPENDENT_AMBULATORY_CARE_PROVIDER_SITE_OTHER)

## 2024-04-01 ENCOUNTER — Ambulatory Visit: Payer: Self-pay | Admitting: Family Medicine

## 2024-04-01 VITALS — BP 132/74 | HR 50 | Temp 98.3°F | Ht 68.0 in | Wt 205.2 lb

## 2024-04-01 DIAGNOSIS — M79674 Pain in right toe(s): Secondary | ICD-10-CM

## 2024-04-01 DIAGNOSIS — R052 Subacute cough: Secondary | ICD-10-CM | POA: Diagnosis not present

## 2024-04-01 DIAGNOSIS — S46811A Strain of other muscles, fascia and tendons at shoulder and upper arm level, right arm, initial encounter: Secondary | ICD-10-CM | POA: Diagnosis not present

## 2024-04-01 DIAGNOSIS — S29011A Strain of muscle and tendon of front wall of thorax, initial encounter: Secondary | ICD-10-CM | POA: Diagnosis not present

## 2024-04-01 NOTE — Progress Notes (Signed)
 Established Patient Office Visit   Subjective  Patient ID: Brad Terry, male    DOB: May 20, 1956  Age: 68 y.o. MRN: [id number]  Chief Complaint  Patient presents with   Cough    Cough that started in April, patienti s having right side chest pain, started 2 days ago, with wheezing and rubbing    Pt is a 68 yo male followed by Dr. Ethel Henry and seen for ongoing concern.  Patient endorses pain in right chest, right neck, and right upper back/shoulder on Saturday.  Symptoms started after trimming hedges with manual clippers.  Discomfort rated as 5/10, occurs with certain motions.  Patient also mentions cough since early April after returning from a trip to [place].  Patient states at the time his wife also had a cough.  Patient's cough has persisted and is occasionally productive.  Cough worse at night with increased drainage.  Patient thought symptoms were related to allergies.  Patient mentions history of right great toe pain intermittently times years.  Occurred after getting foot caught while attempting to jump over a ditch during a trip to [place].  Patient states toe was hyperextended.  Tries to wear supportive shoes.  Previously seen by podiatry who recommended surgery    Patient Active Problem List   Diagnosis Date Noted   Spinal stenosis of lumbar region 03/10/2024   Other pancytopenia (HCC) 07/18/2023   Primary osteoarthritis of right hip 07/21/2021   Internal hemorrhoids 01/06/2015   Skin lesion 04/26/2014   Right shoulder pain 04/26/2014   Left-sided low back pain without sciatica 04/26/2014   Neck pain on right side 04/17/2013   Family history of diabetes mellitus type II 08/04/2012   Family hx of prostate cancer 08/04/2012   Leukocytopenia 03/14/2010   HEMORRHOIDS 03/14/2010   DIVERTICULOSIS, COLON 03/14/2010   History of colonic polyps 03/14/2010   NONSPECIFIC ABNORMAL ELECTROCARDIOGRAM 09/13/2009   HEMATURIA, MICROSCOPIC, HX OF 09/13/2009   HIP PAIN, RIGHT  07/19/2009   KNEE PAIN, RIGHT 07/19/2009   ANEMIA 01/26/2008   RHINITIS 10/06/2007   MRI, BRAIN, ABNORMAL 10/06/2007   Thrombocytopenia (HCC) 09/11/2007   MEMORY LOSS 09/11/2007   FLANK PAIN, LEFT 09/11/2007   HYPERLIPIDEMIA NEC/NOS 07/10/2007   Past Medical History:  Diagnosis Date   Allergy     CHEST PAIN UNSPECIFIED 01/04/2010   Qualifier: Diagnosis of  By: Mitzie Anda, MD, [name]     Diabetes [facility])    walked and lost weight and diabetes went away per patient   Diverticulosis    GERD (gastroesophageal reflux disease)    Headaches due to old head injury    Hyperlipidemia    Internal hemorrhoids    RECTAL BLEEDING 09/14/2010   Qualifier: Diagnosis of  By: Ethel Henry MD, Joaquim Muir    Renal cyst    Rotator cuff (capsule) sprain 05/27/2013   Right     Sleep apnea    URTICARIA, ALLERGIC 12/30/2007   Qualifier: Diagnosis of  By: Alyne Babinski MD, Lenis Quin    Past Surgical History:  Procedure Laterality Date   COLONOSCOPY  12/27/2009   Brodie-hyperplastic polyp   INGUINAL HERNIA REPAIR Right 09/2022   KNEE ARTHROSCOPY     right   under the chin surgery      Social History   Tobacco Use   Smoking status: Never    Passive exposure: Never   Smokeless tobacco: Never  Vaping Use   Vaping status: Never Used  Substance Use Topics   Alcohol use: No    Alcohol/week: 0.0  standard drinks of alcohol   Drug use: No   Family History  Problem Relation Age of Onset   Diabetes Mother    Diabetes Brother        x 2   Prostate cancer Brother        x 2   Colon cancer Neg Hx    Colon polyps Neg Hx    Esophageal cancer Neg Hx    Rectal cancer Neg Hx    Stomach cancer Neg Hx    Allergies  Allergen Reactions   Advil  [Ibuprofen ] Hives   Naproxen     Able to take aleve, tinnitus   Piroxicam     REACTION: tinnitus   Statins     Hx of se of 2 statins    Tylenol [Acetaminophen] Hives    ROS Negative unless stated above    Objective:      BP 132/74 (BP Location: Left Arm, Patient  Position: Sitting, Cuff Size: Normal)   Pulse (!) 50   Temp 98.3 F (36.8 C) (Oral)   Ht 5\' 8"  (1.727 m)   Wt 205 lb 3.2 oz (93.1 kg)   SpO2 99%   BMI 31.20 kg/m  BP Readings from Last 3 Encounters:  04/01/24 132/74  03/26/24 120/70  02/20/24 110/70   Wt Readings from Last 3 Encounters:  04/01/24 205 lb 3.2 oz (93.1 kg)  03/26/24 203 lb (92.1 kg)  02/20/24 208 lb (94.3 kg)      Physical Exam Constitutional:      General: He is not in acute distress.    Appearance: Normal appearance.  HENT:     Head: Normocephalic and atraumatic.     Nose: Nose normal.     Mouth/Throat:     Mouth: Mucous membranes are moist.  Cardiovascular:     Rate and Rhythm: Normal rate and regular rhythm.     Heart sounds: Normal heart sounds. No murmur heard.    No gallop.  Pulmonary:     Effort: Pulmonary effort is normal. No respiratory distress.     Breath sounds: Normal breath sounds. No wheezing, rhonchi or rales.     Comments: Intermittent productive sounding cough. Skin:    General: Skin is warm and dry.  Neurological:     Mental Status: He is alert and oriented to person, place, and time.     No results found for any visits on 04/01/24.    Assessment & Plan:   Subacute cough -     DG Chest 2 View  Pectoralis muscle strain, initial encounter  Strain of right trapezius muscle, initial encounter  Great toe pain, right   Subacute cough.  Discussed possible causes including bronchitis, GERD, postnasal drainage from seasonal allergies.  Given duration of cough will obtain CXR.  Encouraged to take Protonix  40 mg daily.  Rx written on 02/20/2024 by this provider however  tried once per note from GI visit 03/26/2024.  Can also try OTC antihistamine or saline nasal rinse to help with allergy  symptoms.  Chest, neck, shoulder pain likely 2/2 muscle strain from overuse while trimming hedges.  Discussed supportive care including heat, ice, stretching, massage, topical analgesics, Tylenol.   Discussed likely duration of symptoms.  Pain in right great toe likely 2/2 arthritis versus ligament injury.  Patient encouraged to set up follow-up appointment with podiatry for second opinion regarding fusion versus newer surgical procedure.  Return if symptoms worsen or fail to improve.   Viola Greulich, MD

## 2024-04-03 ENCOUNTER — Ambulatory Visit (HOSPITAL_COMMUNITY)
Admission: RE | Admit: 2024-04-03 | Discharge: 2024-04-03 | Disposition: A | Discharge status: Home / Self Care | Source: Ambulatory Visit | Attending: Gastroenterology | Admitting: Gastroenterology

## 2024-04-03 ENCOUNTER — Ambulatory Visit: Payer: Self-pay | Admitting: Gastroenterology

## 2024-04-03 DIAGNOSIS — K760 Fatty (change of) liver, not elsewhere classified: Secondary | ICD-10-CM

## 2024-04-16 ENCOUNTER — Inpatient Hospital Stay (HOSPITAL_COMMUNITY): Admission: RE | Admit: 2024-04-16 | Source: Ambulatory Visit

## 2024-04-21 ENCOUNTER — Encounter: Payer: Self-pay | Admitting: Internal Medicine

## 2024-04-23 ENCOUNTER — Ambulatory Visit (INDEPENDENT_AMBULATORY_CARE_PROVIDER_SITE_OTHER): Admitting: Podiatry

## 2024-04-23 ENCOUNTER — Ambulatory Visit (INDEPENDENT_AMBULATORY_CARE_PROVIDER_SITE_OTHER)

## 2024-04-23 ENCOUNTER — Encounter: Payer: Self-pay | Admitting: Podiatry

## 2024-04-23 VITALS — Ht 68.0 in | Wt 205.2 lb

## 2024-04-23 DIAGNOSIS — M19071 Primary osteoarthritis, right ankle and foot: Secondary | ICD-10-CM | POA: Diagnosis not present

## 2024-04-23 NOTE — Progress Notes (Signed)
 Subjective:  Patient ID: Brad Terry, male    DOB: 08/07/1956,  MRN: [id number]  Chief Complaint  Patient presents with   Foot Pain    Pt is here to discuss first metatarsal phalangeal joint fusion, he states the joint in right great toe is very very painful, and would like to set up for surgery.    Discussed the use of AI scribe software for clinical note transcription with the patient, who gave verbal consent to proceed.  History of Present Illness Brad Terry is a 68 year old male who presents with right big toe pain and limited mobility.  He has been experiencing significant pain in his right big toe for several years, which began after an incident approximately three to four years ago in [place]. During this event, he misjudged a step, causing his leg to plunge into a gutter and resulting in a crack in the bone of his toe. Since then, he has had recurrent pain, especially when he accidentally hits the toe against objects at night.  The pain is described as excruciating at times, with a shooting sensation that travels up his leg, particularly when walking. The pain can become very intense, and he sometimes feels an itch if he does not walk for a few days. He emphasizes the need to walk daily to maintain circulation and manage the itch.  He denies having any medical issues such as diabetes and reports being in good health otherwise. He does not smoke. He mentions having stairs in his house but does not live alone, indicating he has assistance available.  He is planning to travel to [place] from July 14th to July 31st. He expresses concern about the recovery process, particularly the need to avoid walking for a few weeks post-surgery, as he is accustomed to walking regularly.      Objective:    Physical Exam VASCULAR: DP and PT pulse palpable. Foot is warm and well-perfused. Capillary fill time is brisk. DERMATOLOGIC: Normal skin turgor, texture, and  temperature. No open lesions, rashes, or ulcerations. NEUROLOGIC: Normal sensation to light touch and pressure. No paresthesias. ORTHOPEDIC: Tenderness and edema over the first MTPJ on the right foot with a palpable dorsal spur. Limited range of motion in the right foot. No ecchymosis or bruising. No gross deformity.   No images are attached to the encounter.    Results RADIOLOGY Right foot radiograph: Moderate to severe arthritic changes of the first MTP joint with osteophytes (04/23/2024)   Assessment:   1. Arthritis of first metatarsophalangeal (MTP) joint of right foot      Plan:  Patient was evaluated and treated and all questions answered.  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 Radiographs show significant arthritic changes and bone spurs. Surgical intervention is recommended to alleviate symptoms and prevent further deterioration. Surgical MTP fusion is discussed as the best long-term solution, involving removal of bone spurs and joint fusion to eliminate pain and inflammation. Risks include infection and delayed bone healing, with a 90% success rate for bone healing. Recovery involves a period of non-weight bearing followed by gradual weight-bearing in a boot. The procedure is expected to last about an hour and will be performed as an outpatient under sedation and a nerve block. Post-operative recovery includes non-weight  bearing for two to two and a half weeks, followed by weight-bearing in a boot for six weeks. Driving is not advised during this period due to limited ankle mobility in the boot. He is advised to rest and avoid excessive walking to ensure proper healing. - Schedule surgical MTP fusion of the right big toe  joint as an outpatient procedure. - Instruct to remain non-weight bearing for the first two weeks post-surgery, using crutches or a knee scooter. - Recommend purchasing a knee scooter from a medical supply store or [employer]. - Transition to weight-bearing in a walking boot after two and a half weeks, continuing for six weeks. - Advise against driving while in the walking boot. - Provide information packet on the surgery center and post-operative equipment. - Discuss potential need for assistance at home during recovery.    Surgical plan:  Procedure: - Right first MTP fusion  Location: - [facility]  Anesthesia plan: - Sedation with local Exparel versus regional block  Postoperative pain plan: - Tylenol 1000 mg every 6 hours, ibuprofen  600 mg every 6 hours, gabapentin 300 mg every 8 hours x5 days, oxycodone 5 mg 1-2 tabs every 6 hours only as needed  DVT prophylaxis: - None required  WB Restrictions / DME needs: - NWB in CAM boot postop    No follow-ups on file.

## 2024-04-23 NOTE — Patient Instructions (Signed)
  VISIT SUMMARY: You came in today because of significant pain and limited mobility in your right big toe, which has been ongoing for several years following an injury. We discussed your symptoms, including the excruciating pain and shooting sensations you experience, especially when walking. You also shared your concerns about your upcoming travel plans and the impact of surgery on your daily routine.  YOUR PLAN: -HALLUX LIMITUS WITH ARTHRITIS OF RIGHT BIG TOE: Hallux limitus with arthritis is a condition where the big toe joint has limited movement and is affected by arthritis, causing pain and stiffness. We recommend a surgical procedure called MTP fusion to remove bone spurs and fuse the joint, which will help eliminate pain and inflammation. The surgery will be done as an outpatient procedure under sedation and a nerve block, lasting about an hour. Post-surgery, you will need to avoid putting weight on your foot for the first two weeks, using crutches or a knee scooter. After two and a half weeks, you can start weight-bearing in a walking boot for six weeks. Driving is not advised during this period. We also discussed the need for assistance at home during your recovery.  INSTRUCTIONS: Please schedule the surgical MTP fusion of your right big toe joint as an outpatient procedure. Remain non-weight bearing for the first two weeks post-surgery, using crutches or a knee scooter, which you can purchase from a medical supply store or [employer]. After two and a half weeks, transition to weight-bearing in a walking boot for six weeks. Avoid driving while in the walking boot. We provided you with an information packet on the surgery center and post-operative equipment. Ensure you have assistance at home during your recovery.                      Contains text generated by [employer].                                 Contains text generated by [employer].

## 2024-05-05 ENCOUNTER — Telehealth: Payer: Self-pay | Admitting: Podiatry

## 2024-05-05 NOTE — Telephone Encounter (Signed)
 Received surgical consent forms.  Left message with pts daughter to have pt call me back to get the surgery scheduled.

## 2024-06-07 NOTE — Progress Notes (Deleted)
 Brad Terry [id number] August 26, 1956   Chief Complaint:  Referring Provider: [name], MD Primary GI MD: Dr. [name]  HPI: Brad Terry is a 68 y.o. male with past medical history of diabetes, diverticulosis, GERD, HLD, internal hemorrhoids, sleep apnea who presents today for a complaint of esophageal problems.     05/28/2023 office visit with [name], PA-C for evaluation of reflux.  At that time patient was having worsening GERD symptoms, occasional right and left upper abdominal discomfort, occasional dysphagia to solids or liquids.  Repeat EGD with possible dilation was recommended along with colonoscopy as patient was due for repeat. No dilation was performed.  At last visit 03/26/2024 patient reported dysphagia, with sensation of food sitting in his lower chest/upper abdomen.  Had not been taking PPI.  Barium swallow was ordered for further evaluation patient advised to start Protonix  40 mg once daily.   He was due for repeat abdominal ultrasound and this was arranged last visit. Also reported having a viral illness after returning from a trip to [place], with concern for residual bronchitis based on cough and postnasal drip.  Advised to follow-up with PCP.  Has not had barium swallow   Previous GI Procedures/Imaging   RUQ US  04/03/2024 1. No acute abnormality identified. 2. Increased echotexture of the liver. This is a nonspecific finding but can be seen in fatty infiltration of liver.  EGD 07/29/2023 - Z- line regular, 40 cm from the incisors.  - No gross lesions in the entire esophagus.  - Normal stomach.  - Normal examined duodenum.  - No specimens collected.   Colonoscopy 07/29/2023 - One 3 mm polyp in the transverse colon, removed with a cold snare. Resected and retrieved.  - Moderate diverticulosis in the sigmoid colon, in the descending colon, in the transverse colon and in the ascending colon. - Non- bleeding external and internal  hemorrhoids. - Recall 5 years Path: - Hyperplastic polyp   US  abdomen complete 07/09/2023 1. No cholelithiasis or sonographic evidence for acute cholecystitis. 2. Mildly increased hepatic parenchymal echogenicity suggestive of steatosis. 3. Septated cyst superior pole right kidney. Recommend follow-up renal ultrasound in 6 months to ensure stability.   Colonoscopy 02/09/2020 - Three 5 to 9 mm polyps in the transverse colon and in the ascending colon, removed with a cold snare. Resected and retrieved.  - One less than 1 mm polyp in the ascending colon, removed with a cold biopsy forceps. Resected and retrieved.  - Mild diverticulosis in the sigmoid colon, in the descending colon, in the transverse colon and in the ascending colon.  - Non- bleeding internal hemorrhoids.  - The examination was otherwise normal. Path: Surgical [P], colon, transverse and ascending, polyp (4) - TUBULAR ADENOMA(S). - HIGH GRADE DYSPLASIA IS NOT IDENTIFIED.   EGD 05/23/2010 - Irregular Z-line - Otherwise normal examination   Colonoscopy 12/27/2009 - Diminutive polyp in the rectum - Mild diverticulosis - Otherwise normal examination   Past Medical History:  Diagnosis Date   Allergy     CHEST PAIN UNSPECIFIED 01/04/2010   Qualifier: Diagnosis of  By: Rolan, MD, [name]     Diabetes [facility])    walked and lost weight and diabetes went away per patient   Diverticulosis    GERD (gastroesophageal reflux disease)    Headaches due to old head injury    Hyperlipidemia    Internal hemorrhoids    RECTAL BLEEDING 09/14/2010   Qualifier: Diagnosis of  By: Charlett MD, Apolinar POUR    Renal  cyst    Rotator cuff (capsule) sprain 05/27/2013   Right     Sleep apnea    URTICARIA, ALLERGIC 12/30/2007   Qualifier: Diagnosis of  By: Johnny MD, Garnette LABOR     Past Surgical History:  Procedure Laterality Date   COLONOSCOPY  12/27/2009   Brodie-hyperplastic polyp   INGUINAL HERNIA REPAIR Right 09/2022   KNEE ARTHROSCOPY      right   under the chin surgery       Current Outpatient Medications  Medication Sig Dispense Refill   pantoprazole  (PROTONIX ) 40 MG tablet Take 1 tablet (40 mg total) by mouth 2 (two) times daily before a meal. 60 tablet 3   No current facility-administered medications for this visit.    Allergies as of 06/08/2024 - Review Complete 04/23/2024  Allergen Reaction Noted   Advil  [ibuprofen ] Hives 01/01/2023   Naproxen  09/11/2007   Piroxicam  09/11/2007   Statins  08/04/2012   Tylenol [acetaminophen] Hives 01/01/2023    Family History  Problem Relation Age of Onset   Diabetes Mother    Diabetes Brother        x 2   Prostate cancer Brother        x 2   Colon cancer Neg Hx    Colon polyps Neg Hx    Esophageal cancer Neg Hx    Rectal cancer Neg Hx    Stomach cancer Neg Hx     Social History   Tobacco Use   Smoking status: Never    Passive exposure: Never   Smokeless tobacco: Never  Vaping Use   Vaping status: Never Used  Substance Use Topics   Alcohol use: No    Alcohol/week: 0.0 standard drinks of alcohol   Drug use: No     Review of Systems:    Constitutional: No weight loss, fever, chills, weakness or fatigue Eyes: No change in vision Ears, Nose, Throat:  No change in hearing or congestion Skin: No rash or itching Cardiovascular: No chest pain, chest pressure or palpitations   Respiratory: No SOB or cough Gastrointestinal: See HPI and otherwise negative Genitourinary: No dysuria or change in urinary frequency Neurological: No headache, dizziness or syncope Musculoskeletal: No new muscle or joint pain Hematologic: No bleeding or bruising    Physical Exam:  Vital signs: There were no vitals taken for this visit.  Constitutional: NAD, Well developed, Well nourished, alert and cooperative Head:  Normocephalic and atraumatic.  Eyes: No scleral icterus. Conjunctiva pink. Mouth: No oral lesions. Respiratory: Respirations even and unlabored. Lungs clear to  auscultation bilaterally.  No wheezes, crackles, or rhonchi.  Cardiovascular:  Regular rate and rhythm. No murmurs. No peripheral edema. Gastrointestinal:  Soft, nondistended, nontender. No rebound or guarding. Normal bowel sounds. No appreciable masses or hepatomegaly. Rectal:  Not performed.  Neurologic:  Alert and oriented x4;  grossly normal neurologically.  Skin:   Dry and intact without significant lesions or rashes. Psychiatric: Oriented to person, place and time. Demonstrates good judgement and reason without abnormal affect or behaviors.   RELEVANT LABS AND IMAGING: CBC    Component Value Date/Time   WBC 3.1 (L) 09/17/2023 1505   WBC 4.0 09/05/2023 0810   RBC 4.06 (L) 09/17/2023 1505   HGB 12.2 (L) 09/17/2023 1505   HGB 12.1 (L) 03/20/2019 1115   HGB 13.2 11/29/2010 1432   HCT 36.2 (L) 09/17/2023 1505   HCT 36.5 (L) 03/20/2019 1115   HCT 39.2 11/29/2010 1432   PLT 100 (L) 09/17/2023  1505   PLT 108 (L) 11/29/2010 1432   MCV 89.2 09/17/2023 1505   MCV 90 03/20/2019 1115   MCV 85.2 11/29/2010 1432   MCH 30.0 09/17/2023 1505   MCHC 33.7 09/17/2023 1505   RDW 12.5 09/17/2023 1505   RDW 13.0 03/20/2019 1115   RDW 13.4 11/29/2010 1432   LYMPHSABS 1.1 09/17/2023 1505   LYMPHSABS 1.3 03/20/2019 1115   LYMPHSABS 1.4 11/29/2010 1432   MONOABS 0.2 09/17/2023 1505   MONOABS 0.3 11/29/2010 1432   EOSABS 0.2 09/17/2023 1505   EOSABS 0.1 03/20/2019 1115   BASOSABS 0.0 09/17/2023 1505   BASOSABS 0.0 03/20/2019 1115   BASOSABS 0.0 11/29/2010 1432    CMP     Component Value Date/Time   NA 141 07/18/2023 1326   NA 140 03/20/2019 1115   K 3.8 07/18/2023 1326   CL 107 07/18/2023 1326   CO2 29 07/18/2023 1326   GLUCOSE 97 07/18/2023 1326   BUN 15 07/18/2023 1326   BUN 12 03/20/2019 1115   CREATININE 0.99 07/18/2023 1326   CALCIUM 9.4 07/18/2023 1326   PROT 6.4 (L) 07/18/2023 1326   PROT 6.4 03/20/2019 1115   ALBUMIN 4.1 07/18/2023 1326   ALBUMIN 4.2 03/20/2019 1115    AST 18 07/18/2023 1326   ALT 13 07/18/2023 1326   ALKPHOS 50 07/18/2023 1326   BILITOT 0.6 07/18/2023 1326   GFRNONAA >60 07/18/2023 1326   GFRAA 102 03/20/2019 1115     Assessment/Plan:    Needs updated CMP to follow hepatic steatosis   Camie Furbish, PA-C [facility] 06/07/2024, 9:13 PM  Patient Care Team: [name], MD as PCP - Diedre Sherrod Sherrod, MD (Hematology and Oncology)

## 2024-06-08 ENCOUNTER — Ambulatory Visit: Admitting: Gastroenterology

## 2024-11-24 ENCOUNTER — Ambulatory Visit: Admitting: Family Medicine
